# Patient Record
Sex: Female | Born: 1990 | Race: Black or African American | Hispanic: No | Marital: Single | Smoking: Current some day smoker
Health system: Southern US, Community
[De-identification: ages and names within clinical notes are randomized; demographics above are authoritative.]

## PROBLEM LIST (undated history)

## (undated) DIAGNOSIS — T7840XA Allergy, unspecified, initial encounter: Secondary | ICD-10-CM

## (undated) HISTORY — DX: Allergy, unspecified, initial encounter: T78.40XA

## (undated) HISTORY — PX: NO PAST SURGERIES: SHX2092

---

## 2010-01-06 ENCOUNTER — Emergency Department (HOSPITAL_COMMUNITY): Admission: EM | Admit: 2010-01-06 | Discharge: 2010-01-06 | Payer: Self-pay | Admitting: Emergency Medicine

## 2010-01-07 ENCOUNTER — Emergency Department (HOSPITAL_COMMUNITY)
Admission: EM | Admit: 2010-01-07 | Discharge: 2010-01-07 | Payer: Self-pay | Source: Home / Self Care | Admitting: Emergency Medicine

## 2010-01-21 ENCOUNTER — Emergency Department (HOSPITAL_COMMUNITY)
Admission: EM | Admit: 2010-01-21 | Discharge: 2010-01-21 | Payer: Self-pay | Source: Home / Self Care | Admitting: Emergency Medicine

## 2010-02-13 ENCOUNTER — Emergency Department (HOSPITAL_COMMUNITY)
Admission: EM | Admit: 2010-02-13 | Discharge: 2010-02-13 | Payer: Self-pay | Source: Home / Self Care | Admitting: Emergency Medicine

## 2010-08-21 LAB — CBC
Hemoglobin: 13.6 g/dL (ref 12.0–15.0)
MCH: 31.3 pg (ref 26.0–34.0)
MCHC: 34.3 g/dL (ref 30.0–36.0)
MCV: 91.2 fL (ref 78.0–100.0)
RBC: 4.34 MIL/uL (ref 3.87–5.11)
WBC: 10.3 10*3/uL (ref 4.0–10.5)

## 2010-08-21 LAB — POCT I-STAT, CHEM 8
Calcium, Ion: 1.2 mmol/L (ref 1.12–1.32)
Chloride: 104 mEq/L (ref 96–112)
HCT: 43 % (ref 36.0–46.0)
Hemoglobin: 14.6 g/dL (ref 12.0–15.0)

## 2010-08-21 LAB — DIFFERENTIAL
Basophils Relative: 0 % (ref 0–1)
Eosinophils Absolute: 0.2 10*3/uL (ref 0.0–0.7)
Eosinophils Relative: 2 % (ref 0–5)
Monocytes Absolute: 0.7 10*3/uL (ref 0.1–1.0)
Neutrophils Relative %: 79 % — ABNORMAL HIGH (ref 43–77)

## 2010-08-21 LAB — URINALYSIS, ROUTINE W REFLEX MICROSCOPIC
Protein, ur: NEGATIVE mg/dL
pH: 6.5 (ref 5.0–8.0)

## 2010-08-21 LAB — WET PREP, GENITAL: Clue Cells Wet Prep HPF POC: NONE SEEN

## 2010-08-22 LAB — BASIC METABOLIC PANEL
BUN: 7 mg/dL (ref 6–23)
CO2: 26 mEq/L (ref 19–32)
Calcium: 9.6 mg/dL (ref 8.4–10.5)
Chloride: 107 mEq/L (ref 96–112)
Creatinine, Ser: 0.66 mg/dL (ref 0.4–1.2)
GFR calc non Af Amer: >60 mL/min (ref >60)
Potassium: 4.1 mEq/L (ref 3.5–5.1)

## 2010-08-22 LAB — COMPREHENSIVE METABOLIC PANEL
ALT: 23 U/L (ref 0–35)
Albumin: 4.1 g/dL (ref 3.5–5.2)
Alkaline Phosphatase: 64 U/L (ref 39–117)
CO2: 25 mEq/L (ref 19–32)
Calcium: 9.9 mg/dL (ref 8.4–10.5)
Chloride: 105 mEq/L (ref 96–112)
Creatinine, Ser: 0.78 mg/dL (ref 0.4–1.2)
GFR calc non Af Amer: >60 mL/min (ref >60)
Glucose, Bld: 116 mg/dL — ABNORMAL HIGH (ref 70–99)
Potassium: 4.5 mEq/L (ref 3.5–5.1)
Total Bilirubin: 1 mg/dL (ref 0.3–1.2)

## 2010-08-22 LAB — CBC
HCT: 42.6 % (ref 36.0–46.0)
Hemoglobin: 14.6 g/dL (ref 12.0–15.0)
MCH: 31.3 pg (ref 26.0–34.0)
MCH: 31.6 pg (ref 26.0–34.0)
MCV: 91.2 fL (ref 78.0–100.0)
Platelets: 192 10*3/uL (ref 150–400)
Platelets: 191 10*3/uL (ref 150–400)
RBC: 4.67 MIL/uL (ref 3.87–5.11)
RBC: 4.53 MIL/uL (ref 3.87–5.11)
RDW: 13.1 % (ref 11.5–15.5)

## 2010-08-22 LAB — DIFFERENTIAL
Eosinophils Relative: 1 % (ref 0–5)
Lymphocytes Relative: 10 % — ABNORMAL LOW (ref 12–46)
Lymphocytes Relative: 9 % — ABNORMAL LOW (ref 12–46)
Lymphs Abs: 1.1 10*3/uL (ref 0.7–4.0)
Monocytes Absolute: 0.6 10*3/uL (ref 0.1–1.0)
Monocytes Absolute: 0.5 10*3/uL (ref 0.1–1.0)
Monocytes Relative: 4 % (ref 3–12)
Neutro Abs: 10.5 10*3/uL — ABNORMAL HIGH (ref 1.7–7.7)
Neutrophils Relative %: 85 % — ABNORMAL HIGH (ref 43–77)
Neutrophils Relative %: 86 % — ABNORMAL HIGH (ref 43–77)

## 2010-08-22 LAB — URINALYSIS, ROUTINE W REFLEX MICROSCOPIC
Bilirubin Urine: NEGATIVE
Glucose, UA: NEGATIVE mg/dL
Hgb urine dipstick: NEGATIVE
Ketones, ur: NEGATIVE mg/dL
Leukocytes, UA: NEGATIVE
Specific Gravity, Urine: 1.011 (ref 1.005–1.030)
pH: 7.5 (ref 5.0–8.0)

## 2010-08-22 LAB — WET PREP, GENITAL
Trich, Wet Prep: NONE SEEN
Yeast Wet Prep HPF POC: NONE SEEN

## 2010-08-22 LAB — PREGNANCY, URINE: Preg Test, Ur: NEGATIVE

## 2010-08-22 LAB — URINE MICROSCOPIC-ADD ON

## 2010-10-27 ENCOUNTER — Emergency Department (HOSPITAL_COMMUNITY)
Admission: EM | Admit: 2010-10-27 | Discharge: 2010-10-27 | Disposition: A | Discharge status: Home / Self Care | Payer: Self-pay | Attending: Emergency Medicine | Admitting: Emergency Medicine

## 2010-10-27 DIAGNOSIS — R110 Nausea: Secondary | ICD-10-CM | POA: Insufficient documentation

## 2010-10-27 DIAGNOSIS — R197 Diarrhea, unspecified: Secondary | ICD-10-CM | POA: Insufficient documentation

## 2010-10-27 DIAGNOSIS — R109 Unspecified abdominal pain: Secondary | ICD-10-CM | POA: Insufficient documentation

## 2010-10-27 LAB — URINALYSIS, ROUTINE W REFLEX MICROSCOPIC
Bilirubin Urine: NEGATIVE
Hgb urine dipstick: NEGATIVE
Protein, ur: NEGATIVE mg/dL
Specific Gravity, Urine: 1.018 (ref 1.005–1.030)
Urobilinogen, UA: 0.2 mg/dL (ref 0.0–1.0)
pH: 6 (ref 5.0–8.0)

## 2010-10-27 LAB — URINE MICROSCOPIC-ADD ON

## 2011-08-10 ENCOUNTER — Emergency Department (HOSPITAL_COMMUNITY)
Admission: EM | Admit: 2011-08-10 | Discharge: 2011-08-10 | Disposition: A | Discharge status: Home / Self Care | Payer: Self-pay | Attending: Emergency Medicine | Admitting: Emergency Medicine

## 2011-08-10 ENCOUNTER — Encounter (HOSPITAL_COMMUNITY): Payer: Self-pay | Admitting: *Deleted

## 2011-08-10 DIAGNOSIS — R110 Nausea: Secondary | ICD-10-CM | POA: Insufficient documentation

## 2011-08-10 DIAGNOSIS — R109 Unspecified abdominal pain: Secondary | ICD-10-CM | POA: Insufficient documentation

## 2011-08-10 DIAGNOSIS — R197 Diarrhea, unspecified: Secondary | ICD-10-CM | POA: Insufficient documentation

## 2011-08-10 DIAGNOSIS — F17200 Nicotine dependence, unspecified, uncomplicated: Secondary | ICD-10-CM | POA: Insufficient documentation

## 2011-08-10 LAB — URINALYSIS, ROUTINE W REFLEX MICROSCOPIC
Bilirubin Urine: NEGATIVE
Glucose, UA: NEGATIVE mg/dL
Hgb urine dipstick: NEGATIVE
Ketones, ur: NEGATIVE mg/dL
Nitrite: NEGATIVE
Protein, ur: NEGATIVE mg/dL
Specific Gravity, Urine: 1.03 (ref 1.005–1.030)
Urobilinogen, UA: 1 mg/dL (ref 0.0–1.0)
pH: 6 (ref 5.0–8.0)

## 2011-08-10 LAB — CBC
HCT: 37.6 % (ref 36.0–46.0)
Hemoglobin: 12.9 g/dL (ref 12.0–15.0)
RBC: 4.09 MIL/uL (ref 3.87–5.11)

## 2011-08-10 LAB — BASIC METABOLIC PANEL
BUN: 10 mg/dL (ref 6–23)
CO2: 24 mEq/L (ref 19–32)
Chloride: 108 mEq/L (ref 96–112)
Creatinine, Ser: 0.74 mg/dL (ref 0.50–1.10)
Glucose, Bld: 106 mg/dL — ABNORMAL HIGH (ref 70–99)
Potassium: 3.4 mEq/L — ABNORMAL LOW (ref 3.5–5.1)

## 2011-08-10 LAB — DIFFERENTIAL
Basophils Absolute: 0 10*3/uL (ref 0.0–0.1)
Lymphs Abs: 1.7 10*3/uL (ref 0.7–4.0)
Monocytes Absolute: 0.4 10*3/uL (ref 0.1–1.0)
Monocytes Relative: 6 % (ref 3–12)
Neutro Abs: 5 10*3/uL (ref 1.7–7.7)
Neutrophils Relative %: 68 % (ref 43–77)

## 2011-08-10 LAB — URINE MICROSCOPIC-ADD ON

## 2011-08-10 MED ORDER — ONDANSETRON HCL 4 MG/2ML IJ SOLN
4.0000 mg | Freq: Once | INTRAMUSCULAR | Status: AC
  Administered 2011-08-10: 4 mg via INTRAVENOUS
  Filled 2011-08-10: qty 2

## 2011-08-10 MED ORDER — LOPERAMIDE HCL 2 MG PO CAPS
4.0000 mg | ORAL_CAPSULE | Freq: Once | ORAL | Status: AC
  Administered 2011-08-10: 4 mg via ORAL
  Filled 2011-08-10: qty 1

## 2011-08-10 MED ORDER — METOCLOPRAMIDE HCL 10 MG PO TABS: 10.0000 mg | ORAL_TABLET | Freq: Four times a day (QID) | ORAL | Status: DC | PRN

## 2011-08-10 MED ORDER — SODIUM CHLORIDE 0.9 % IV SOLN
Freq: Once | INTRAVENOUS | Status: AC
  Administered 2011-08-10: 10:00:00 via INTRAVENOUS

## 2011-08-10 MED ORDER — SODIUM CHLORIDE 0.9 % IV BOLUS (SEPSIS)
1000.0000 mL | Freq: Once | INTRAVENOUS | Status: AC
  Administered 2011-08-10 (×2): 1000 mL via INTRAVENOUS

## 2011-08-10 NOTE — ED Provider Notes (Signed)
History     CSN: [id number]  Arrival date & time 08/10/11  0865   First MD Initiated Contact with Patient 08/10/11 [phone]      Chief Complaint  Patient presents with  . Abdominal Pain  . Nausea  . Diarrhea    (Consider location/radiation/quality/duration/timing/severity/associated sxs/prior treatment) Patient is a 21 y.o. female presenting with abdominal pain and diarrhea. The history is provided by the patient.  Abdominal Pain The primary symptoms of the illness include abdominal pain and diarrhea.  Diarrhea The primary symptoms include abdominal pain and diarrhea.  She noted onset this morning at 7 AM of crampy suprapubic pain which radiated up the left abdomen into the left upper quadrant. There is associated nausea and diarrhea. Pain got worse just before having diarrhea, and was better after a bowel movement. There is no radiation to the back. She's not had any vomiting. Pain is severe. Current pain is rated at 8/10, and it was 10 out of 10 at its worst. Nothing else seems to affect the pain. She's not taken any medication for it. She denies any sick contacts. Denies fever, chills, sweats.  History reviewed. No pertinent past medical history.  History reviewed. No pertinent past surgical history.  No family history on file.  History  Substance Use Topics  . Smoking status: Current Everyday Smoker  . Smokeless tobacco: Not on file  . Alcohol Use: Yes    OB History    Grav Para Term Preterm Abortions TAB SAB Ect Mult Living                  Review of Systems  Gastrointestinal: Positive for abdominal pain and diarrhea.  All other systems reviewed and are negative.    Allergies  Review of patient's allergies indicates no known allergies.  Home Medications  No current outpatient prescriptions on file.  BP 129/64  Pulse 56  Temp(Src) 98 F (36.7 C) (Oral)  Resp 16  Ht 5\' 2"  (1.575 m)  Wt 210 lb (95.255 kg)  BMI 38.41 kg/m2  SpO2 98%  Physical Exam    Nursing note and vitals reviewed.  21 year old female who is resting comfortably and in no acute distress. Vital signs are significant for mild bradycardia with heart rate of 56. Oxygen saturation is 98% which is normal. She is moderately obese. Head is normocephalic and atraumatic. PERRLA, EOMI. Oropharynx is clear mucous membranes are moist. Neck is nontender and supple. There is no adenopathy. Back is nontender. Lungs are clear without rales, wheezes, rhonchi. Heart has regular rate rhythm without murmur. Is no chest wall tenderness. Abdomen is soft, flat, nontender without masses or hepatosplenomegaly. Peristalsis is diminished. Extremities have no cyanosis or edema, full range of motion is present. Skin is warm and dry without rash. Neurologic: Mental status is normal, cranial nerves are intact, there no focal motor or sensory deficits.  ED Course  Procedures (including critical care time)  Results for orders placed during the hospital encounter of 08/10/11  URINALYSIS, ROUTINE W REFLEX MICROSCOPIC      Component Value Range   Color, Urine YELLOW  YELLOW    APPearance CLOUDY (*) CLEAR    Specific Gravity, Urine 1.030  1.005 - 1.030    pH 6.0  5.0 - 8.0    Glucose, UA NEGATIVE  NEGATIVE (mg/dL)   Hgb urine dipstick NEGATIVE  NEGATIVE    Bilirubin Urine NEGATIVE  NEGATIVE    Ketones, ur NEGATIVE  NEGATIVE (mg/dL)   Protein, ur NEGATIVE  NEGATIVE (  mg/dL)   Urobilinogen, UA 1.0  0.0 - 1.0 (mg/dL)   Nitrite NEGATIVE  NEGATIVE    Leukocytes, UA MODERATE (*) NEGATIVE   CBC      Component Value Range   WBC 7.3  4.0 - 10.5 (K/uL)   RBC 4.09  3.87 - 5.11 (MIL/uL)   Hemoglobin 12.9  12.0 - 15.0 (g/dL)   HCT 40.9  81.1 - 91.4 (%)   MCV 91.9  78.0 - 100.0 (fL)   MCH 31.5  26.0 - 34.0 (pg)   MCHC 34.3  30.0 - 36.0 (g/dL)   RDW 78.2  95.6 - 21.3 (%)   Platelets 194  150 - 400 (K/uL)  DIFFERENTIAL      Component Value Range   Neutrophils Relative 68  43 - 77 (%)   Neutro Abs 5.0  1.7 - 7.7  (K/uL)   Lymphocytes Relative 23  12 - 46 (%)   Lymphs Abs 1.7  0.7 - 4.0 (K/uL)   Monocytes Relative 6  3 - 12 (%)   Monocytes Absolute 0.4  0.1 - 1.0 (K/uL)   Eosinophils Relative 3  0 - 5 (%)   Eosinophils Absolute 0.2  0.0 - 0.7 (K/uL)   Basophils Relative 0  0 - 1 (%)   Basophils Absolute 0.0  0.0 - 0.1 (K/uL)  BASIC METABOLIC PANEL      Component Value Range   Sodium 142  135 - 145 (mEq/L)   Potassium 3.4 (*) 3.5 - 5.1 (mEq/L)   Chloride 108  96 - 112 (mEq/L)   CO2 24  19 - 32 (mEq/L)   Glucose, Bld 106 (*) 70 - 99 (mg/dL)   BUN 10  6 - 23 (mg/dL)   Creatinine, Ser 0.86  0.50 - 1.10 (mg/dL)   Calcium 9.1  8.4 - 57.8 (mg/dL)   GFR calc non Af Amer >90  >90 (mL/min)   GFR calc Af Amer >90  >90 (mL/min)  POCT PREGNANCY, URINE      Component Value Range   Preg Test, Ur NEGATIVE  NEGATIVE   URINE MICROSCOPIC-ADD ON      Component Value Range   Squamous Epithelial / LPF MANY (*) RARE    WBC, UA 3-6  <3 (WBC/hpf)   Bacteria, UA FEW (*) RARE    Urine-Other MUCOUS PRESENT     She feels much better after IV hydration, IV Zofran, and oral loperamide. She laboratory workup is unremarkable. She will be discharged with a prescription for metoclopramide tablets and told to use loperamide as needed for diarrhea. She is to return if symptoms worsen.  1. Diarrhea   2. Nausea       MDM  Abdominal cramping with diarrhea nausea most consistent with a viral illness. She'll be given IV fluids, IV antiemetics, oral antidiarrheal medications and laboratory workup has been initiated. She will be reassessed after the above treatment.        Dione Booze, MD 08/10/11 1025

## 2011-08-10 NOTE — ED Notes (Signed)
Pt states she began having low abdominal pain this am.  Pt reports nausea with a small episode of diarrhea.

## 2011-08-10 NOTE — ED Notes (Signed)
Patient reports onset of lower abd pain and n/d at 0700.  Patient reports 3 to 4 episodes of loose bm

## 2011-08-10 NOTE — Discharge Instructions (Signed)
Take Imodium AD as needed for diarrhea. Return to the ED if your pain is getting worse, you start running a fever, or if you are vomiting in spite of the nausea medicine.  Diarrhea Infections caused by germs (bacterial) or a virus commonly cause diarrhea. Your caregiver has determined that with time, rest and fluids, the diarrhea should improve. In general, eat normally while drinking more water than usual. Although water may prevent dehydration, it does not contain salt and minerals (electrolytes). Broths, weak tea without caffeine and oral rehydration solutions (ORS) replace fluids and electrolytes. Small amounts of fluids should be taken frequently. Large amounts at one time may not be tolerated. Plain water may be harmful in infants and the elderly. Oral rehydrating solutions (ORS) are available at pharmacies and grocery stores. ORS replace water and important electrolytes in proper proportions. Sports drinks are not as effective as ORS and may be harmful due to sugars worsening diarrhea.  ORS is especially recommended for use in children with diarrhea. As a general guideline for children, replace any new fluid losses from diarrhea and/or vomiting with ORS as follows:   If your child weighs 22 pounds or under (10 kg or less), give 60-120 mL ( -  cup or 2 - 4 ounces) of ORS for each episode of diarrheal stool or vomiting episode.   If your child weighs more than 22 pounds (more than 10 kgs), give 120-240 mL ( - 1 cup or 4 - 8 ounces) of ORS for each diarrheal stool or episode of vomiting.   While correcting for dehydration, children should eat normally. However, foods high in sugar should be avoided because this may worsen diarrhea. Large amounts of carbonated soft drinks, juice, gelatin desserts and other highly sugared drinks should be avoided.   After correction of dehydration, other liquids that are appealing to the child may be added. Children should drink small amounts of fluids frequently  and fluids should be increased as tolerated. Children should drink enough fluids to keep urine clear or pale yellow.   Adults should eat normally while drinking more fluids than usual. Drink small amounts of fluids frequently and increase as tolerated. Drink enough fluids to keep urine clear or pale yellow. Broths, weak decaffeinated tea, lemon lime soft drinks (allowed to go flat) and ORS replace fluids and electrolytes.   Avoid:   Carbonated drinks.   Juice.   Extremely hot or cold fluids.   Caffeine drinks.   Fatty, greasy foods.   Alcohol.   Tobacco.   Too much intake of anything at one time.   Gelatin desserts.   Probiotics are active cultures of beneficial bacteria. They may lessen the amount and number of diarrheal stools in adults. Probiotics can be found in yogurt with active cultures and in supplements.   Wash hands well to avoid spreading bacteria and virus.   Anti-diarrheal medications are not recommended for infants and children.   Only take over-the-counter or prescription medicines for pain, discomfort or fever as directed by your caregiver. Do not give aspirin to children because it may cause Reye's Syndrome.   For adults, ask your caregiver if you should continue all prescribed and over-the-counter medicines.   If your caregiver has given you a follow-up appointment, it is very important to keep that appointment. Not keeping the appointment could result in a chronic or permanent injury, and disability. If there is any problem keeping the appointment, you must call back to this facility for assistance.  SEEK IMMEDIATE MEDICAL  CARE IF:   You or your child is unable to keep fluids down or other symptoms or problems become worse in spite of treatment.   Vomiting or diarrhea develops and becomes persistent.   There is vomiting of blood or bile (green material).   There is blood in the stool or the stools are black and tarry.   There is no urine output in 6-8  hours or there is only a small amount of very dark urine.   Abdominal pain develops, increases or localizes.   You have a fever.   Your baby is older than 3 months with a rectal temperature of 102 F (38.9 C) or higher.   Your baby is 31 months old or younger with a rectal temperature of 100.4 F (38 C) or higher.   You or your child develops excessive weakness, dizziness, fainting or extreme thirst.   You or your child develops a rash, stiff neck, severe headache or become irritable or sleepy and difficult to awaken.  MAKE SURE YOU:   Understand these instructions.   Will watch your condition.   Will get help right away if you are not doing well or get worse.  Document Released: 05/15/2002 Document Revised: 05/14/2011 Document Reviewed: 04/01/2009 [facility] Patient Information 2012 [place], [place].  Nausea and Vomiting Nausea is a sick feeling that often comes before throwing up (vomiting). Vomiting is a reflex where stomach contents come out of your mouth. Vomiting can cause severe loss of body fluids (dehydration). Children and elderly adults can become dehydrated quickly, especially if they also have diarrhea. Nausea and vomiting are symptoms of a condition or disease. It is important to find the cause of your symptoms. CAUSES   Direct irritation of the stomach lining. This irritation can result from increased acid production (gastroesophageal reflux disease), infection, food poisoning, taking certain medicines (such as nonsteroidal anti-inflammatory drugs), alcohol use, or tobacco use.   Signals from the brain.These signals could be caused by a headache, heat exposure, an inner ear disturbance, increased pressure in the brain from injury, infection, a tumor, or a concussion, pain, emotional stimulus, or metabolic problems.   An obstruction in the gastrointestinal tract (bowel obstruction).   Illnesses such as diabetes, hepatitis, gallbladder problems, appendicitis, kidney  problems, cancer, sepsis, atypical symptoms of a heart attack, or eating disorders.   Medical treatments such as chemotherapy and radiation.   Receiving medicine that makes you sleep (general anesthetic) during surgery.  DIAGNOSIS Your caregiver may ask for tests to be done if the problems do not improve after a few days. Tests may also be done if symptoms are severe or if the reason for the nausea and vomiting is not clear. Tests may include:  Urine tests.   Blood tests.   Stool tests.   Cultures (to look for evidence of infection).   X-rays or other imaging studies.  Test results can help your caregiver make decisions about treatment or the need for additional tests. TREATMENT You need to stay well hydrated. Drink frequently but in small amounts.You may wish to drink water, sports drinks, clear broth, or eat frozen ice pops or gelatin dessert to help stay hydrated.When you eat, eating slowly may help prevent nausea.There are also some antinausea medicines that may help prevent nausea. HOME CARE INSTRUCTIONS   Take all medicine as directed by your caregiver.   If you do not have an appetite, do not force yourself to eat. However, you must continue to drink fluids.   If you  have an appetite, eat a normal diet unless your caregiver tells you differently.   Eat a variety of complex carbohydrates (rice, wheat, potatoes, bread), lean meats, yogurt, fruits, and vegetables.   Avoid high-fat foods because they are more difficult to digest.   Drink enough water and fluids to keep your urine clear or pale yellow.   If you are dehydrated, ask your caregiver for specific rehydration instructions. Signs of dehydration may include:   Severe thirst.   Dry lips and mouth.   Dizziness.   Dark urine.   Decreasing urine frequency and amount.   Confusion.   Rapid breathing or pulse.  SEEK IMMEDIATE MEDICAL CARE IF:   You have blood or brown flecks (like coffee grounds) in your  vomit.   You have black or bloody stools.   You have a severe headache or stiff neck.   You are confused.   You have severe abdominal pain.   You have chest pain or trouble breathing.   You do not urinate at least once every 8 hours.   You develop cold or clammy skin.   You continue to vomit for longer than 24 to 48 hours.   You have a fever.  MAKE SURE YOU:   Understand these instructions.   Will watch your condition.   Will get help right away if you are not doing well or get worse.  Document Released: 05/25/2005 Document Revised: 05/14/2011 Document Reviewed: 10/22/2010 [facility] Patient Information 2012 [place], [place].  Metoclopramide tablets What is this medicine? METOCLOPRAMIDE (met oh kloe PRA mide) is used to treat the symptoms of gastroesophageal reflux disease (GERD) like heartburn. It is also used to treat people with slow emptying of the stomach and intestinal tract. This medicine may be used for other purposes; ask your health care provider or pharmacist if you have questions. What should I tell my health care provider before I take this medicine? They need to know if you have any of these conditions: -breast cancer -depression -diabetes -heart failure -high blood pressure -kidney disease -liver disease -Parkinson's disease or a movement disorder -pheochromocytoma -seizures -stomach obstruction, bleeding, or perforation -an unusual or allergic reaction to metoclopramide, procainamide, sulfites, other medicines, foods, dyes, or preservatives -pregnant or trying to get pregnant -breast-feeding How should I use this medicine? Take this medicine by mouth with a glass of water. Follow the directions on the prescription label. Take this medicine on an empty stomach, about 30 minutes before eating. Take your doses at regular intervals. Do not take your medicine more often than directed. Do not stop taking except on the advice of your doctor or health care  professional. A special MedGuide will be given to you by the pharmacist with each prescription and refill. Be sure to read this information carefully each time. Talk to your pediatrician regarding the use of this medicine in children. Special care may be needed. Overdosage: If you think you have taken too much of this medicine contact a poison control center or emergency room at once. NOTE: This medicine is only for you. Do not share this medicine with others. What if I miss a dose? If you miss a dose, take it as soon as you can. If it is almost time for your next dose, take only that dose. Do not take double or extra doses. What may interact with this medicine? -acetaminophen -cyclosporine -digoxin -medicines for blood pressure -medicines for diabetes, including insulin -medicines for hay fever and other allergies -medicines for depression, especially an  Monoamine Oxidase Inhibitor (MAOI) -medicines for Parkinson's disease, like levodopa -medicines for sleep or for pain -tetracycline This list may not describe all possible interactions. Give your health care provider a list of all the medicines, herbs, non-prescription drugs, or dietary supplements you use. Also tell them if you smoke, drink alcohol, or use illegal drugs. Some items may interact with your medicine. What should I watch for while using this medicine? It may take a few weeks for your stomach condition to start to get better. However, do not take this medicine for longer than 12 weeks. The longer you take this medicine, and the more you take it, the greater your chances are of developing serious side effects. If you are an elderly patient, a female patient, or you have diabetes, you may be at an increased risk for side effects from this medicine. Contact your doctor immediately if you start having movements you cannot control such as lip smacking, rapid movements of the tongue, involuntary or uncontrollable movements of the eyes,  head, arms and legs, or muscle twitches and spasms. Patients and their families should watch out for worsening depression or thoughts of suicide. Also watch out for any sudden or severe changes in feelings such as feeling anxious, agitated, panicky, irritable, hostile, aggressive, impulsive, severely restless, overly excited and hyperactive, or not being able to sleep. If this happens, especially at the beginning of treatment or after a change in dose, call your doctor. Do not treat yourself for high fever. Ask your doctor or health care professional for advice. You may get drowsy or dizzy. Do not drive, use machinery, or do anything that needs mental alertness until you know how this drug affects you. Do not stand or sit up quickly, especially if you are an older patient. This reduces the risk of dizzy or fainting spells. Alcohol can make you more drowsy and dizzy. Avoid alcoholic drinks. What side effects may I notice from receiving this medicine? Side effects that you should report to your doctor or health care professional as soon as possible: -allergic reactions like skin rash, itching or hives, swelling of the face, lips, or tongue -abnormal production of milk in females -breast enlargement in both males and females -change in the way you walk -difficulty moving, speaking or swallowing -drooling, lip smacking, or rapid movements of the tongue -excessive sweating -fever -involuntary or uncontrollable movements of the eyes, head, arms and legs -irregular heartbeat or palpitations -muscle twitches and spasms -unusually weak or tired Side effects that usually do not require medical attention (report to your doctor or health care professional if they continue or are bothersome): -change in sex drive or performance -depressed mood -diarrhea -difficulty sleeping -headache -menstrual changes -restless or nervous This list may not describe all possible side effects. Call your doctor for  medical advice about side effects. You may report side effects to FDA at 1-800-FDA-1088. Where should I keep my medicine? Keep out of the reach of children. Store at room temperature between 20 and 25 degrees C (68 and 77 degrees F). Protect from light. Keep container tightly closed. Throw away any unused medicine after the expiration date. NOTE: This sheet is a summary. It may not cover all possible information. If you have questions about this medicine, talk to your doctor, pharmacist, or health care provider.  2012, [employer]. (01/18/2008 4:30:05 PM)

## 2012-06-25 IMAGING — CT CT ABD-PELV W/ CM
2 of 4 series · 17 of 46 positions shown, 19 images · IV contrast (100 ML OMNI 300)
Comparison: None.

CLINICAL DATA: Abdominal pain.

CT ABDOMEN AND PELVIS WITH CONTRAST
TECHNIQUE: Multidetector CT imaging of the abdomen and pelvis was
performed following the standard protocol during bolus
administration of intravenous contrast.
Contrast: 100 ml Rmnipaque-DYY IV

[Series 2: routine abdomen · axial · 0.70mm/px · z∈[-412,-52]mm · 14 of 78 slices shown, 16 images]
[im 4/78  soft-tissue]
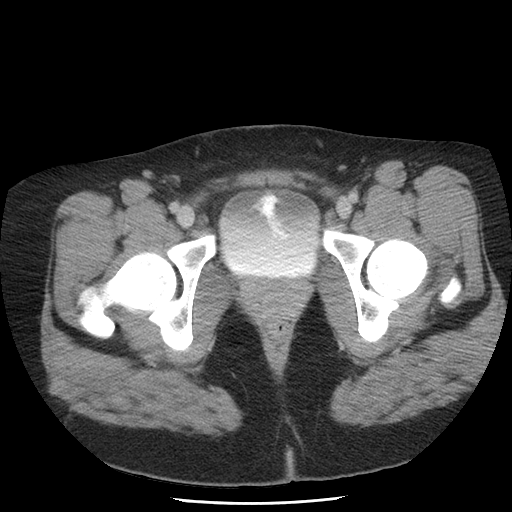
[im 4/78  bone]
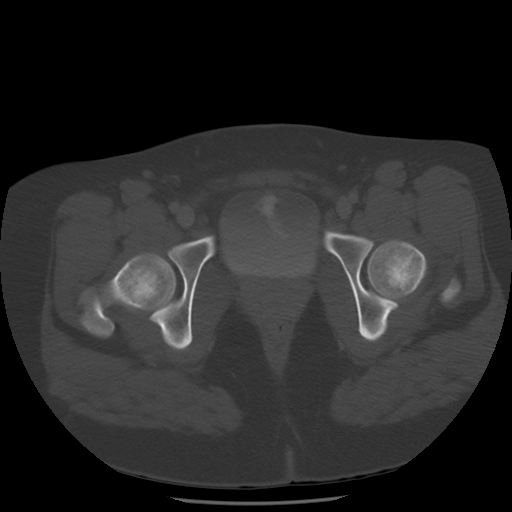
[im 11/78  soft-tissue]
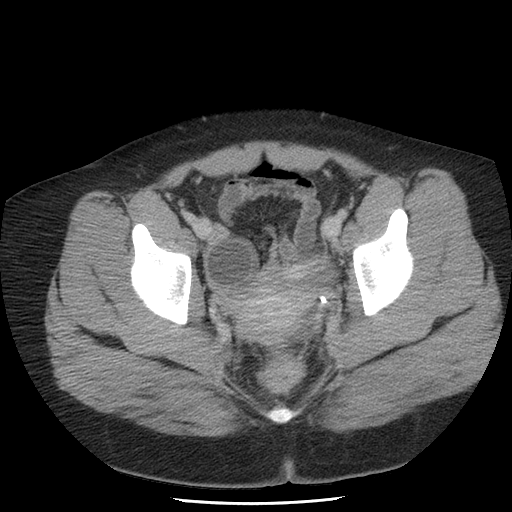
[im 14/78  soft-tissue]
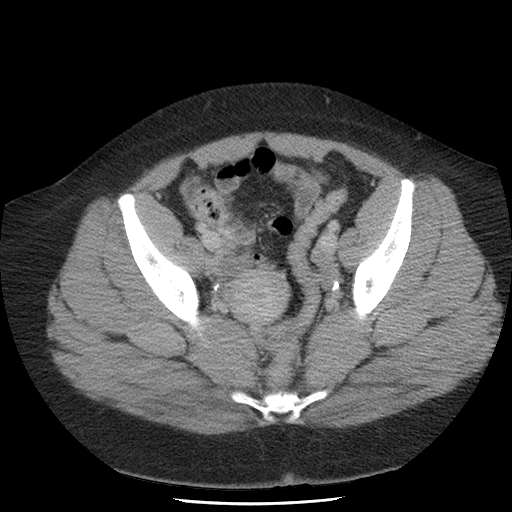
[im 21/78  soft-tissue]
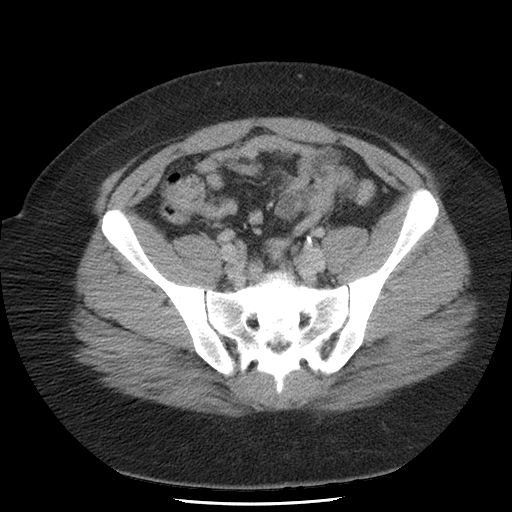
[im 27/78  soft-tissue]
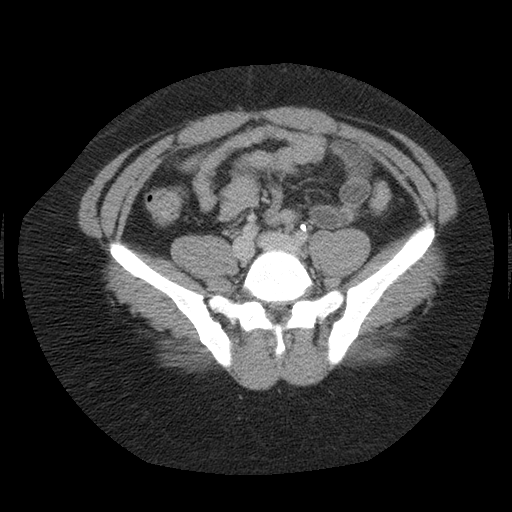
[im 31/78  soft-tissue]
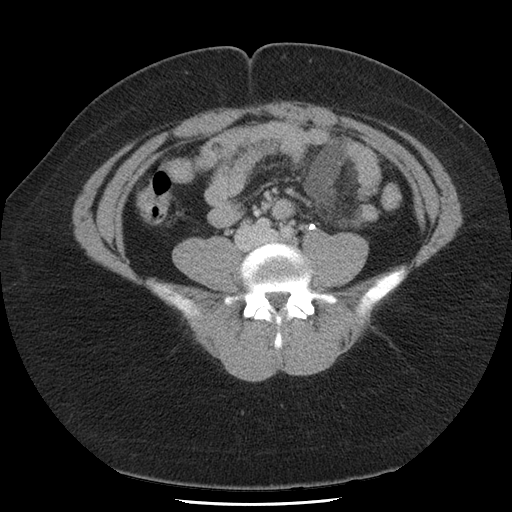
[im 37/78  soft-tissue]
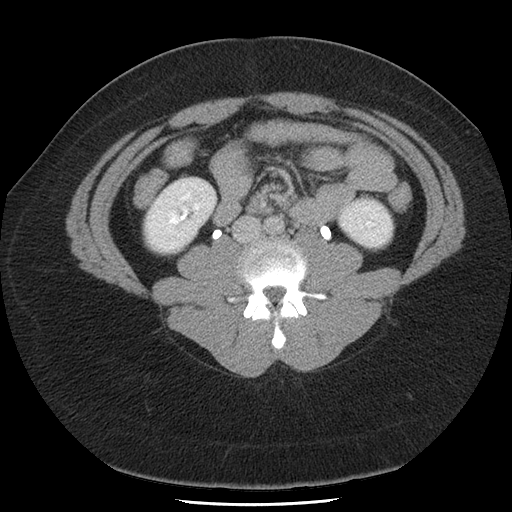
[im 41/78  soft-tissue]
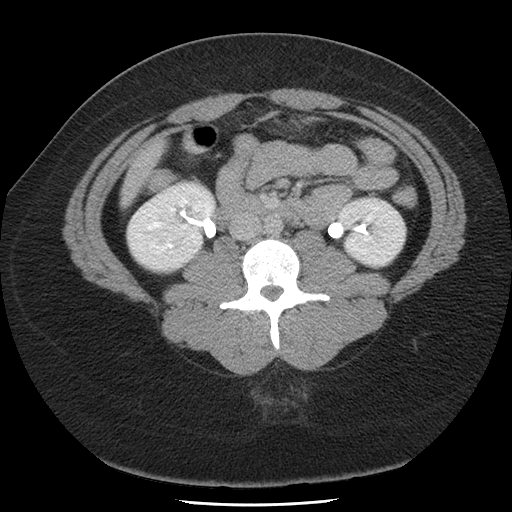
[im 47/78  soft-tissue]
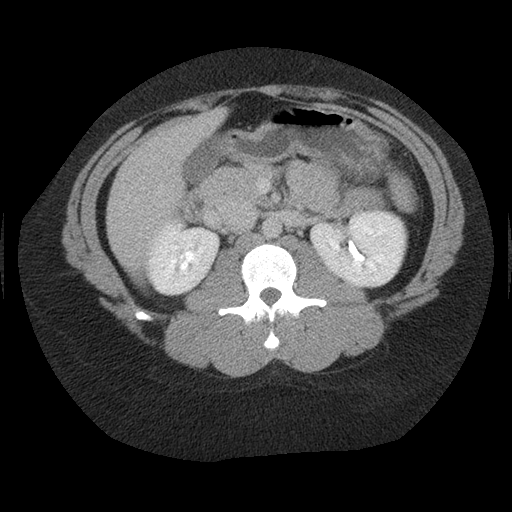
[im 47/78  bone]
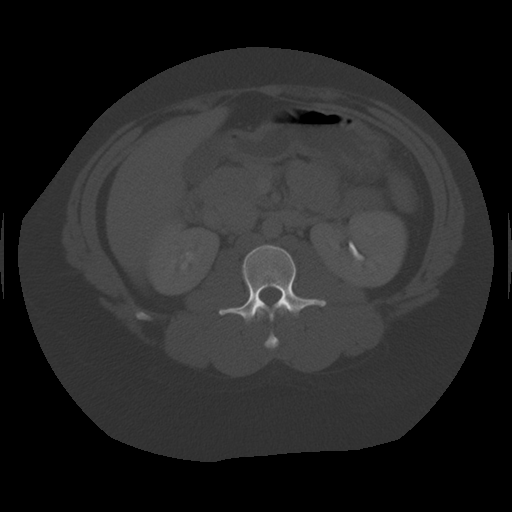
[im 51/78  soft-tissue]
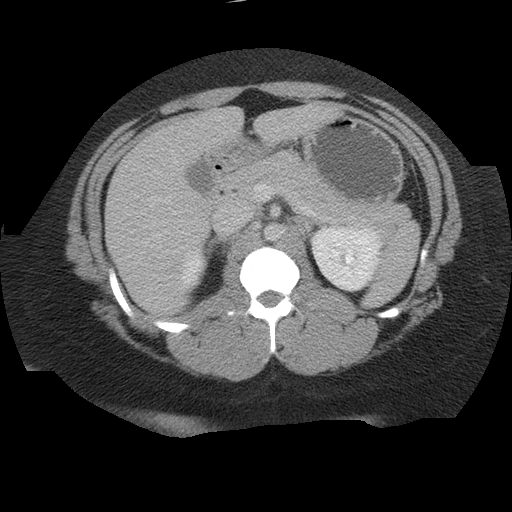
[im 57/78  soft-tissue]
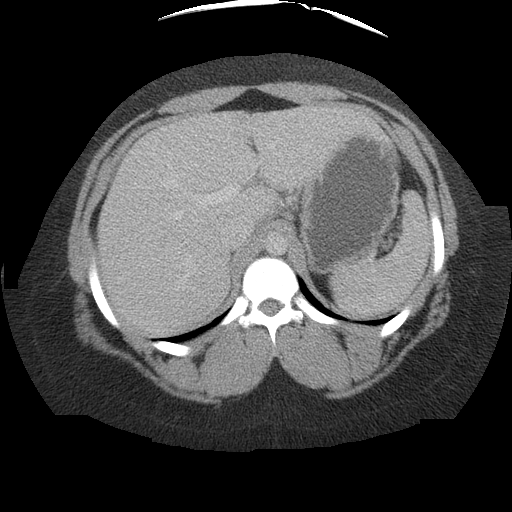
[im 64/78  soft-tissue]
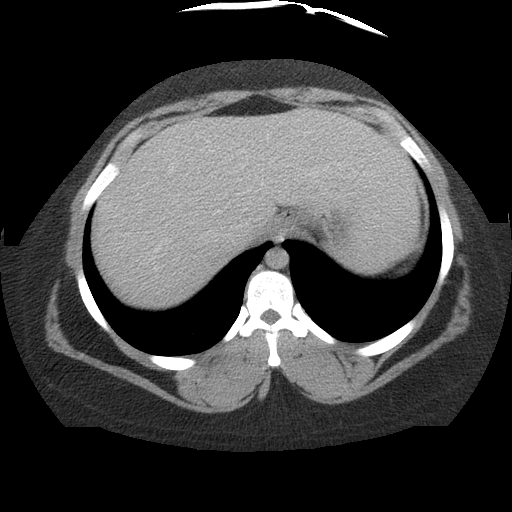
[im 67/78  soft-tissue]
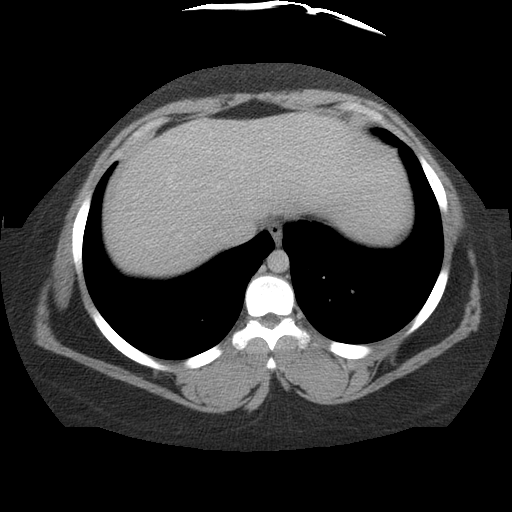
[im 74/78  soft-tissue]
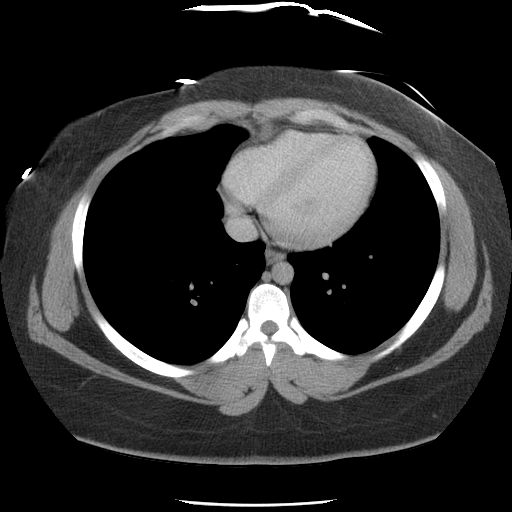

[Series 401: cor · coronal · 0.88mm/px · 3 of 103 slices shown]
[im 35/103  soft-tissue]
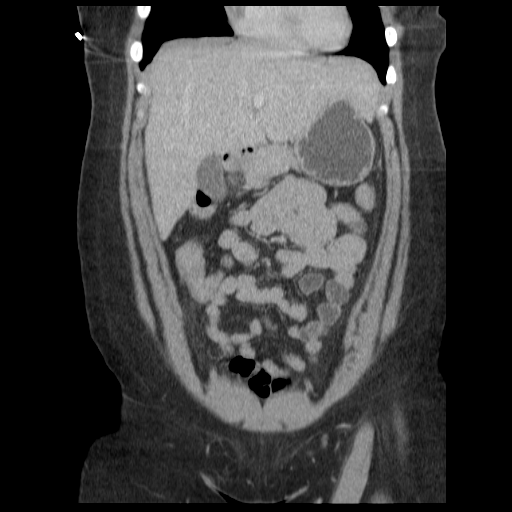
[im 46/103  soft-tissue]
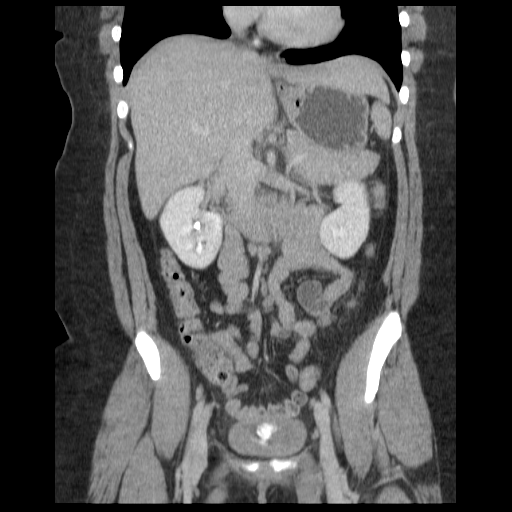
[im 57/103  soft-tissue]
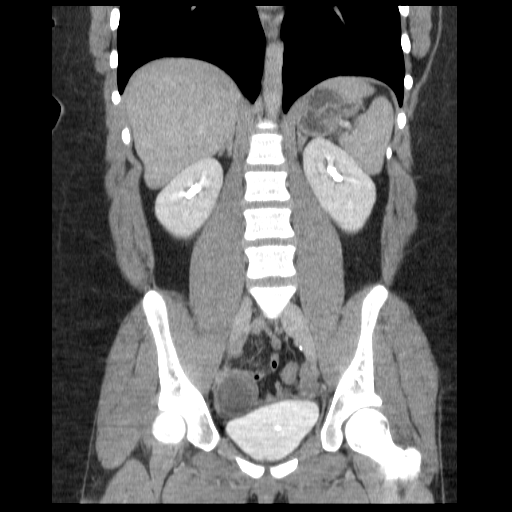

[17 of 46 positions shown; findings below may reference images not displayed]

FINDINGS: The liver, spleen, pancreas, gallbladder, adrenal glands
and kidneys are within normal limits.  No acute inflammatory
process is identified.  There is no CT evidence of appendicitis.
There is an enlarged right adnexal cyst measuring approximately
cm in greatest diameter and showing internal fluid density.  No
associated free fluid identified.  The bladder is unremarkable.

No hernias or abnormal calcifications.  No evidence of bowel
obstruction.
IMPRESSION: No acute findings.  4 cm right adnexal cyst.

## 2013-03-16 ENCOUNTER — Ambulatory Visit (INDEPENDENT_AMBULATORY_CARE_PROVIDER_SITE_OTHER): Payer: Self-pay | Admitting: General Practice

## 2013-03-16 ENCOUNTER — Encounter: Payer: Self-pay | Admitting: Obstetrics & Gynecology

## 2013-03-16 DIAGNOSIS — Z3201 Encounter for pregnancy test, result positive: Secondary | ICD-10-CM

## 2013-03-16 DIAGNOSIS — Z3490 Encounter for supervision of normal pregnancy, unspecified, unspecified trimester: Secondary | ICD-10-CM

## 2013-03-16 LAB — POCT PREGNANCY, URINE: Preg Test, Ur: POSITIVE — AB

## 2013-03-23 ENCOUNTER — Encounter: Payer: Self-pay | Admitting: Obstetrics & Gynecology

## 2013-03-23 ENCOUNTER — Ambulatory Visit (HOSPITAL_COMMUNITY)
Admission: RE | Admit: 2013-03-23 | Discharge: 2013-03-23 | Disposition: A | Discharge status: Home / Self Care | Payer: BC Managed Care – PPO | Source: Ambulatory Visit | Attending: Obstetrics & Gynecology | Admitting: Obstetrics & Gynecology

## 2013-03-23 DIAGNOSIS — R109 Unspecified abdominal pain: Secondary | ICD-10-CM | POA: Insufficient documentation

## 2013-03-23 DIAGNOSIS — Z3689 Encounter for other specified antenatal screening: Secondary | ICD-10-CM | POA: Insufficient documentation

## 2013-03-23 DIAGNOSIS — Z3201 Encounter for pregnancy test, result positive: Secondary | ICD-10-CM

## 2013-03-23 DIAGNOSIS — O99891 Other specified diseases and conditions complicating pregnancy: Secondary | ICD-10-CM | POA: Insufficient documentation

## 2013-03-23 DIAGNOSIS — O4400 Placenta previa specified as without hemorrhage, unspecified trimester: Secondary | ICD-10-CM | POA: Insufficient documentation

## 2013-04-12 ENCOUNTER — Encounter: Payer: Self-pay | Admitting: Advanced Practice Midwife

## 2013-04-12 ENCOUNTER — Ambulatory Visit (INDEPENDENT_AMBULATORY_CARE_PROVIDER_SITE_OTHER): Payer: Self-pay | Admitting: Advanced Practice Midwife

## 2013-04-12 VITALS — BP 135/77 | Temp 98.6°F | Wt 164.9 lb

## 2013-04-12 DIAGNOSIS — Z23 Encounter for immunization: Secondary | ICD-10-CM

## 2013-04-12 DIAGNOSIS — Z3402 Encounter for supervision of normal first pregnancy, second trimester: Secondary | ICD-10-CM

## 2013-04-12 DIAGNOSIS — O4400 Placenta previa specified as without hemorrhage, unspecified trimester: Secondary | ICD-10-CM

## 2013-04-12 LAB — POCT URINALYSIS DIP (DEVICE)
Glucose, UA: NEGATIVE mg/dL
Ketones, ur: NEGATIVE mg/dL
Specific Gravity, Urine: 1.025 (ref 1.005–1.030)

## 2013-04-12 NOTE — Patient Instructions (Signed)
Prenatal Care  WHAT IS PRENATAL CARE?  Prenatal care means health care during your pregnancy, before your baby is born. Take care of yourself and your baby by:   Getting early prenatal care. If you know you are pregnant, or think you might be pregnant, call your caregiver as soon as possible. Schedule a visit for a general/prenatal examination.  Getting regular prenatal care. Follow your caregiver's schedule for blood and other necessary tests. Do not miss appointments.  Do everything you can to keep yourself and your baby healthy during your pregnancy.  Prenatal care should include evaluation of medical, dietary, educational, psychological, and social needs for the couple and the medical, surgical, and genetic history of the family of the mother and father.  Discuss with your caregiver:  Your medicines, prescription, over-the-counter, and herbal medicines.  Substance abuse, alcohol, smoking, and illegal drugs.  Domestic abuse and violence, if present.  Your immunizations.  Nutrition and diet.  Exercising.  Environment and occupational hazards, at home and at work.  History of sexually transmitted disease, both you and your partner.  Previous pregnancies. WHY IS PRENATAL CARE SO IMPORTANT?  By seeing you regularly, your caregiver has the chance to find problems early, so that they can be treated as soon as possible. Other problems might be prevented. Many studies have shown that early and regular prenatal care is important for the health of both mothers and their babies.  I AM THINKING ABOUT GETTING PREGNANT. HOW CAN I TAKE CARE OF MYSELF?  Taking care of yourself before you get pregnant helps you to have a healthy pregnancy. It also lowers your chances of having a baby born with a birth defect. Here are ways to take care of yourself before you get pregnant:   Eat healthy foods, exercise regularly (30 minutes per day for most days of the week is best), and get enough rest and  sleep. Talk to your caregiver about what kinds of foods and exercises are best for you.  Take 400 micrograms (mcg) of folic acid (one of the B vitamins) every day. The best way to do this is to take a daily multivitamin pill that contains this amount of folic acid. Getting enough of the synthetic (manufactured) form of folic acid every day before you get pregnant and during early pregnancy can help prevent certain birth defects. Many breakfast cereals and other grain products have folic acid added to them, but only certain cereals contain 400 mcg of folic acid per serving. Check the label on your multivitamin or cereal to find the amount of folic acid in the food.  See your caregiver for a complete check up before getting pregnant. Make sure that you have had all your immunization shots, especially for rubella ([place] measles). Rubella can cause serious birth defects. Chickenpox is another illness you want to avoid during pregnancy. If you have had chickenpox and rubella in the past, you should be immune to them.  Tell your caregiver about any prescription or non-prescription medicines (including herbal remedies) you are taking. Some medicines are not safe to take during pregnancy.  Stop smoking cigarettes, drinking alcohol, or taking illegal drugs. Ask your caregiver for help, if you need it. You can also get help with alcohol and drugs by talking with a member of your faith community, a counselor, or a trusted friend.  Discuss and treat any medical, social, or psychological problems before getting pregnant.  Discuss any history of genetic problems in the mother, father, and their families. Do  genetic testing before getting pregnant, when possible.  Discuss any physical or emotional abuse with your caregiver.  Discuss with your caregiver if you might be exposed to harmful chemicals on your job or where you live.  Discuss with your caregiver if you think your job or the hours you work may be  harmful and should be changed.  The father should be involved with the decision making and with all aspects of the pregnancy, labor, and delivery.  If you have medical insurance, make sure you are covered for pregnancy. I JUST FOUND OUT THAT I AM PREGNANT. HOW CAN I TAKE CARE OF MYSELF?  Here are ways to take care of yourself and the precious new life growing inside you:   Continue taking your multivitamin with 400 micrograms (mcg) of folic acid every day.  Get early and regular prenatal care. It does not matter if this is your first pregnancy or if you already have children. It is very important to see a caregiver during your pregnancy. Your caregiver will check at each visit to make sure that you and the baby are healthy. If there are any problems, action can be taken right away to help you and the baby.  Eat a healthy diet that includes:  Fruits.  Vegetables.  Foods low in saturated fat.  Grains.  Calcium-rich foods.  Drink 6 to 8 glasses of liquids a day.  Unless your caregiver tells you not to, try to be physically active for 30 minutes, most days of the week. If you are pressed for time, you can get your activity in through 10 minute segments, three times a day.  If you smoke, drink alcohol, or use drugs, STOP. These can cause long-term damage to your baby. Talk with your caregiver about steps to take to stop smoking. Talk with a member of your faith community, a counselor, a trusted friend, or your caregiver if you are concerned about your alcohol or drug use.  Ask your caregiver before taking any medicine, even over-the-counter medicines. Some medicines are not safe to take during pregnancy.  Get plenty of rest and sleep.  Avoid hot tubs and saunas during pregnancy.  Do not have X-rays taken, unless absolutely necessary and with the recommendation of your caregiver. A lead shield can be placed on your abdomen, to protect the baby when X-rays are taken in other parts of the  body.  Do not empty the cat litter when you are pregnant. It may contain a parasite that causes an infection called toxoplasmosis, which can cause birth defects. Also, use gloves when working in garden areas used by cats.  Do not eat uncooked or undercooked cheese, meats, or fish.  Stay away from toxic chemicals like:  Insecticides.  Solvents (some cleaners or paint thinners).  Lead.  Mercury.  Sexual relations may continue until the end of the pregnancy, unless you have a medical problem or there is a problem with the pregnancy and your caregiver tells you not to.  Do not wear high heel shoes, especially during the second half of the pregnancy. You can lose your balance and fall.  Do not take long trips, unless absolutely necessary. Be sure to see your caregiver before going on the trip.  Do not sit in one position for more than 2 hours, when on a trip.  Take a copy of your medical records when going on a trip.  Know where there is a hospital in the city you are visiting, in case of an  emergency.  Most dangerous household products will have pregnancy warnings on their labels. Ask your caregiver about products if you are unsure.  Limit or eliminate your caffeine intake from coffee, tea, sodas, medicines, and chocolate.  Many women continue working through pregnancy. Staying active might help you stay healthier. If you have a question about the safety or the hours you work at your particular job, talk with your caregiver.  Get informed:  Read books.  Watch videos.  Go to childbirth classes for you and the father.  Talk with experienced moms.  Ask your caregiver about childbirth education classes for you and your partner. Classes can help you and your partner prepare for the birth of your baby.  Ask about a pediatrician (baby doctor) and methods and pain medicine for labor, delivery, and possible Cesarean delivery (C-section). I AM NOT THINKING ABOUT GETTING PREGNANT  RIGHT NOW, BUT HEARD THAT ALL WOMEN SHOULD TAKE FOLIC ACID EVERY DAY?  All women of childbearing age, with even a remote chance of getting pregnant, should try to make sure they get enough folic acid. Many pregnancies are not planned. Many women do not know they are actually pregnant early in their pregnancies, and certain birth defects happen in the very early part of pregnancy. Taking 400 micrograms (mcg) of folic acid every day will help prevent certain birth defects that happen in the early part of pregnancy. If a woman begins taking vitamin pills in the second or third month of pregnancy, it may be too late to prevent birth defects. Folic acid may also have other health benefits for women, besides preventing birth defects.  HOW OFTEN SHOULD I SEE MY CAREGIVER DURING PREGNANCY?  Your caregiver will give you a schedule for your prenatal visits. You will have visits more often as you get closer to the end of your pregnancy. An average pregnancy lasts about 40 weeks.  A typical schedule includes visiting your caregiver:   About once each month, during your first 6 months of pregnancy.  Every 2 weeks, during the next 2 months.  Weekly in the last month, until the delivery date. Your caregiver will probably want to see you more often if:  You are over 35.  Your pregnancy is high risk, because you have certain health problems or problems with the pregnancy, such as:  Diabetes.  High blood pressure.  The baby is not growing on schedule, according to the dates of the pregnancy. Your caregiver will do special tests, to make sure you and the baby are not having any serious problems. WHAT HAPPENS DURING PRENATAL VISITS?   At your first prenatal visit, your caregiver will talk to you about you and your partner's health history and your family's health history, and will do a physical exam.  On your first visit, a physical exam will include checks of your blood pressure, height and weight, and an  exam of your pelvic organs. Your caregiver will do a Pap test if you have not had one recently, and will do cultures of your cervix to make sure there is no infection.  At each visit, there will be tests of your blood, urine, blood pressure, weight, and checking the progress of the baby.  Your caregiver will be able to tell you when to expect that your baby will be born.  Each visit is also a chance for you to learn about staying healthy during pregnancy and for asking questions.  Discuss whether you will be breastfeeding.  At your later prenatal  visits, your caregiver will check how you are doing and how the baby is developing. You may have a number of tests done as your pregnancy progresses.  Ultrasound exams are often used to check on the baby's growth and health.  You may have more urine and blood tests, as well as special tests, if needed. These may include amniocentesis (examine fluid in the pregnancy sac), stress tests (check how baby responds to contractions), biophysical profile (measures fetus well-being). Your caregiver will explain the tests and why they are necessary. I AM IN MY LATE THIRTIES, AND I WANT TO HAVE A CHILD NOW. SHOULD I DO ANYTHING SPECIAL?  As you get older, there is more chance of having a medical problem (high blood pressure), pregnancy problem (preeclampsia, problems with the placenta), miscarriage, or a baby born with a birth defect. However, most women in their late thirties and early forties have healthy babies. See your caregiver on a regular basis before you get pregnant and be sure to go for exams throughout your pregnancy. Your caregiver probably will want to do some special tests to check on you and your baby's health when you are pregnant.  Women today are often delaying having children until later in life, when they are in their thirties and forties. While many women in their thirties and forties have no difficulty getting pregnant, fertility does decline  with age. For women over 40 who cannot get pregnant after 6 months of trying, it is recommended that they see their caregiver for a fertility evaluation. It is not uncommon to have trouble becoming pregnant or experience infertility (inability to become pregnant after trying for one year). If you think that you or your partner may be infertile, you can discuss this with your caregiver. He or she can recommend treatments such as drugs, surgery, or assisted reproductive technology.  Document Released: 05/28/2003 Document Revised: 08/17/2011 Document Reviewed: 11/10/2012 [facility] Patient Information 2014 [place], [place]. Second Trimester of Pregnancy The second trimester is from week 13 through week 28, months 4 through 6. The second trimester is often a time when you feel your best. Your body has also adjusted to being pregnant, and you begin to feel better physically. Usually, morning sickness has lessened or quit completely, you may have more energy, and you may have an increase in appetite. The second trimester is also a time when the fetus is growing rapidly. At the end of the sixth month, the fetus is about 9 inches long and weighs about 1 pounds. You will likely begin to feel the baby move (quickening) between 18 and 20 weeks of the pregnancy. BODY CHANGES Your body goes through many changes during pregnancy. The changes vary from woman to woman.   Your weight will continue to increase. You will notice your lower abdomen bulging out.  You may begin to get stretch marks on your hips, abdomen, and breasts.  You may develop headaches that can be relieved by medicines approved by your caregiver.  You may urinate more often because the fetus is pressing on your bladder.  You may develop or continue to have heartburn as a result of your pregnancy.  You may develop constipation because certain hormones are causing the muscles that push waste through your intestines to slow down.  You may develop  hemorrhoids or swollen, bulging veins (varicose veins).  You may have back pain because of the weight gain and pregnancy hormones relaxing your joints between the bones in your pelvis and as a result of  a shift in weight and the muscles that support your balance.  Your breasts will continue to grow and be tender.  Your gums may bleed and may be sensitive to brushing and flossing.  Dark spots or blotches (chloasma, mask of pregnancy) may develop on your face. This will likely fade after the baby is born.  A dark line from your belly button to the pubic area (linea nigra) may appear. This will likely fade after the baby is born. WHAT TO EXPECT AT YOUR PRENATAL VISITS During a routine prenatal visit:  You will be weighed to make sure you and the fetus are growing normally.  Your blood pressure will be taken.  Your abdomen will be measured to track your baby's growth.  The fetal heartbeat will be listened to.  Any test results from the previous visit will be discussed. Your caregiver may ask you:  How you are feeling.  If you are feeling the baby move.  If you have had any abnormal symptoms, such as leaking fluid, bleeding, severe headaches, or abdominal cramping.  If you have any questions. Other tests that may be performed during your second trimester include:  Blood tests that check for:  Low iron levels (anemia).  Gestational diabetes (between 24 and 28 weeks).  Rh antibodies.  Urine tests to check for infections, diabetes, or protein in the urine.  An ultrasound to confirm the proper growth and development of the baby.  An amniocentesis to check for possible genetic problems.  Fetal screens for spina bifida and Down syndrome. HOME CARE INSTRUCTIONS   Avoid all smoking, herbs, alcohol, and unprescribed drugs. These chemicals affect the formation and growth of the baby.  Follow your caregiver's instructions regarding medicine use. There are medicines that are  either safe or unsafe to take during pregnancy.  Exercise only as directed by your caregiver. Experiencing uterine cramps is a good sign to stop exercising.  Continue to eat regular, healthy meals.  Wear a good support bra for breast tenderness.  Do not use hot tubs, steam rooms, or saunas.  Wear your seat belt at all times when driving.  Avoid raw meat, uncooked cheese, cat litter boxes, and soil used by cats. These carry germs that can cause birth defects in the baby.  Take your prenatal vitamins.  Try taking a stool softener (if your caregiver approves) if you develop constipation. Eat more high-fiber foods, such as fresh vegetables or fruit and whole grains. Drink plenty of fluids to keep your urine clear or pale yellow.  Take warm sitz baths to soothe any pain or discomfort caused by hemorrhoids. Use hemorrhoid cream if your caregiver approves.  If you develop varicose veins, wear support hose. Elevate your feet for 15 minutes, 3 4 times a day. Limit salt in your diet.  Avoid heavy lifting, wear low heel shoes, and practice good posture.  Rest with your legs elevated if you have leg cramps or low back pain.  Visit your dentist if you have not gone yet during your pregnancy. Use a soft toothbrush to brush your teeth and be gentle when you floss.  A sexual relationship may be continued unless your caregiver directs you otherwise.  Continue to go to all your prenatal visits as directed by your caregiver. SEEK MEDICAL CARE IF:   You have dizziness.  You have mild pelvic cramps, pelvic pressure, or nagging pain in the abdominal area.  You have persistent nausea, vomiting, or diarrhea.  You have a bad smelling vaginal discharge.  You have pain with urination. SEEK IMMEDIATE MEDICAL CARE IF:   You have a fever.  You are leaking fluid from your vagina.  You have spotting or bleeding from your vagina.  You have severe abdominal cramping or pain.  You have rapid weight  gain or loss.  You have shortness of breath with chest pain.  You notice sudden or extreme swelling of your face, hands, ankles, feet, or legs.  You have not felt your baby move in over an hour.  You have severe headaches that do not go away with medicine.  You have vision changes. Document Released: 05/19/2001 Document Revised: 01/25/2013 Document Reviewed: 07/26/2012 [facility] Patient Information 2014 [place], [place].

## 2013-04-12 NOTE — Progress Notes (Signed)
New OB. Routines reviewed. Will defer pelvic exam today secondary to known placenta previa.   See other note  Subjective:    Hannah Burns is a G1P0 [redacted]w[redacted]d being seen today for her first obstetrical visit.  Her obstetrical history is significant for placenta previa. Patient does intend to breast feed. Pregnancy history fully reviewed.  Patient reports no complaints.  Filed Vitals:   04/12/13 0837  BP: 135/77  Temp: 98.6 F (37 C)  Weight: 164 lb 14.4 oz (74.798 kg)    HISTORY: OB History  Gravida Para Term Preterm AB SAB TAB Ectopic Multiple Living  1             # Outcome Date GA Lbr Len/2nd Weight Sex Delivery Anes PTL Lv  1 CUR              History reviewed. No pertinent past medical history. History reviewed. No pertinent past surgical history. Family History  Problem Relation Age of Onset  . Diabetes Mother   . Diabetes Maternal Grandmother      Exam    Uterus:  Fundal Height: 17 cm  Pelvic Exam:    Perineum: N/a   Vulva: n/a   Vagina:  N/a   pH:    Cervix: Deferred due to placenta previa   Adnexa: not evaluated   Bony Pelvis: n/a  System: Breast:  normal appearance, no masses or tenderness   Skin: normal coloration and turgor, no rashes    Neurologic: oriented, grossly non-focal   Extremities: normal strength, tone, and muscle mass   HEENT neck supple with midline trachea   Mouth/Teeth mucous membranes moist, pharynx normal without lesions   Neck supple and no masses   Cardiovascular: regular rate and rhythm, no murmurs or gallops   Respiratory:  appears well, vitals normal, no respiratory distress, acyanotic, normal RR, ear and throat exam is normal, neck free of mass or lymphadenopathy, chest clear, no wheezing, crepitations, rhonchi, normal symmetric air entry   Abdomen: soft, non-tender; bowel sounds normal; no masses,  no organomegaly   Urinary: urethral meatus normal      Assessment:    Pregnancy: G1P0 Patient Active Problem List   Diagnosis  Date Noted  . Posterior placenta previa antepartum 03/23/2013        SIUP at [redacted]w[redacted]d     Plan:     Initial labs drawn. Prenatal vitamins. Problem list reviewed and updated. Genetic Screening discussed Quad Screen: ordered.  Ultrasound discussed; fetal survey: ordered.  Follow up in 4 weeks. 50% of 30 min visit spent on counseling and coordination of care.  Plan [place] to followup on previa and anatomy. Will do pelvic exam/pap after resolution   [facility] 04/12/2013

## 2013-04-12 NOTE — Progress Notes (Signed)
P= 68  Pt c/o of lower abd pain, desires flu vaccine today, early glucola

## 2013-04-13 LAB — OBSTETRIC PANEL
Basophils Absolute: 0 10*3/uL (ref 0.0–0.1)
Eosinophils Relative: 2 % (ref 0–5)
Hepatitis B Surface Ag: NEGATIVE
Lymphocytes Relative: 16 % (ref 12–46)
MCV: 90.3 fL (ref 78.0–100.0)
Neutro Abs: 7.4 10*3/uL (ref 1.7–7.7)
Neutrophils Relative %: 77 % (ref 43–77)
Platelets: 223 10*3/uL (ref 150–400)
RDW: 13.1 % (ref 11.5–15.5)
Rubella: 17.9 Index — ABNORMAL HIGH (ref <0.90)
WBC: 9.6 10*3/uL (ref 4.0–10.5)

## 2013-04-13 LAB — HIV ANTIBODY (ROUTINE TESTING W REFLEX): HIV: NONREACTIVE

## 2013-04-13 LAB — ALCOHOL METABOLITE (ETG), URINE: Ethyl Glucuronide (EtG): NEGATIVE ng/mL

## 2013-04-14 LAB — HEMOGLOBINOPATHY EVALUATION
Hgb F Quant: 0 % (ref 0.0–2.0)
Hgb S Quant: 0 %

## 2013-04-14 LAB — CANNABANOIDS (GC/LC/MS), URINE: THC-COOH (GC/LC/MS), ur confirm: 933 ng/mL — AB

## 2013-04-14 LAB — CULTURE, OB URINE

## 2013-04-15 LAB — PRESCRIPTION MONITORING PROFILE (19 PANEL)
Barbiturate Screen, Urine: NEGATIVE ng/mL
Benzodiazepine Screen, Urine: NEGATIVE ng/mL
Nitrites, Initial: NEGATIVE ug/mL
Opiate Screen, Urine: NEGATIVE ng/mL
Propoxyphene: NEGATIVE ng/mL
Tramadol Scrn, Ur: NEGATIVE ng/mL
Zolpidem, Urine: NEGATIVE ng/mL
pH, Initial: 7.8 pH (ref 4.5–8.9)

## 2013-04-17 LAB — AFP, QUAD SCREEN
AFP: 36.9 IU/mL
Down Syndrome Scr Risk Est: 1:11000 {titer}
HCG, Total: 18468 m[IU]/mL
INH: 205 pg/mL
Interpretation-AFP: NEGATIVE
MoM for INH: 1.17
Open Spina bifida: NEGATIVE
Osb Risk: 1:22800 {titer}
uE3 Mom: 0.51

## 2013-04-25 ENCOUNTER — Encounter: Payer: Self-pay | Admitting: Advanced Practice Midwife

## 2013-04-26 ENCOUNTER — Encounter: Payer: Self-pay | Admitting: *Deleted

## 2013-05-10 ENCOUNTER — Ambulatory Visit (INDEPENDENT_AMBULATORY_CARE_PROVIDER_SITE_OTHER): Payer: Medicaid Other | Admitting: Obstetrics and Gynecology

## 2013-05-10 VITALS — BP 119/74 | Wt 169.6 lb

## 2013-05-10 DIAGNOSIS — O210 Mild hyperemesis gravidarum: Secondary | ICD-10-CM

## 2013-05-10 DIAGNOSIS — Z3402 Encounter for supervision of normal first pregnancy, second trimester: Secondary | ICD-10-CM

## 2013-05-10 DIAGNOSIS — Z3400 Encounter for supervision of normal first pregnancy, unspecified trimester: Secondary | ICD-10-CM | POA: Insufficient documentation

## 2013-05-10 LAB — POCT URINALYSIS DIP (DEVICE)
Bilirubin Urine: NEGATIVE
Glucose, UA: NEGATIVE mg/dL
Hgb urine dipstick: NEGATIVE
Nitrite: NEGATIVE
Protein, ur: NEGATIVE mg/dL
Urobilinogen, UA: 0.2 mg/dL (ref 0.0–1.0)
pH: 6.5 (ref 5.0–8.0)

## 2013-05-10 NOTE — Progress Notes (Signed)
Nausea with  Vomiting since yesterday, no meds for nausea and requests> Rx Zofran. Mod LE> check C&S. Neg CVAT.

## 2013-05-10 NOTE — Progress Notes (Signed)
Pulse: 65

## 2013-05-10 NOTE — Patient Instructions (Signed)
Second Trimester of Pregnancy The second trimester is from week 13 through week 28, months 4 through 6. The second trimester is often a time when you feel your best. Your body has also adjusted to being pregnant, and you begin to feel better physically. Usually, morning sickness has lessened or quit completely, you may have more energy, and you may have an increase in appetite. The second trimester is also a time when the fetus is growing rapidly. At the end of the sixth month, the fetus is about 9 inches long and weighs about 1 pounds. You will likely begin to feel the baby move (quickening) between 18 and 20 weeks of the pregnancy. BODY CHANGES Your body goes through many changes during pregnancy. The changes vary from woman to woman.   Your weight will continue to increase. You will notice your lower abdomen bulging out.  You may begin to get stretch marks on your hips, abdomen, and breasts.  You may develop headaches that can be relieved by medicines approved by your caregiver.  You may urinate more often because the fetus is pressing on your bladder.  You may develop or continue to have heartburn as a result of your pregnancy.  You may develop constipation because certain hormones are causing the muscles that push waste through your intestines to slow down.  You may develop hemorrhoids or swollen, bulging veins (varicose veins).  You may have back pain because of the weight gain and pregnancy hormones relaxing your joints between the bones in your pelvis and as a result of a shift in weight and the muscles that support your balance.  Your breasts will continue to grow and be tender.  Your gums may bleed and may be sensitive to brushing and flossing.  Dark spots or blotches (chloasma, mask of pregnancy) may develop on your face. This will likely fade after the baby is born.  A dark line from your belly button to the pubic area (linea nigra) may appear. This will likely fade after the  baby is born. WHAT TO EXPECT AT YOUR PRENATAL VISITS During a routine prenatal visit:  You will be weighed to make sure you and the fetus are growing normally.  Your blood pressure will be taken.  Your abdomen will be measured to track your baby's growth.  The fetal heartbeat will be listened to.  Any test results from the previous visit will be discussed. Your caregiver may ask you:  How you are feeling.  If you are feeling the baby move.  If you have had any abnormal symptoms, such as leaking fluid, bleeding, severe headaches, or abdominal cramping.  If you have any questions. Other tests that may be performed during your second trimester include:  Blood tests that check for:  Low iron levels (anemia).  Gestational diabetes (between 24 and 28 weeks).  Rh antibodies.  Urine tests to check for infections, diabetes, or protein in the urine.  An ultrasound to confirm the proper growth and development of the baby.  An amniocentesis to check for possible genetic problems.  Fetal screens for spina bifida and Down syndrome. HOME CARE INSTRUCTIONS   Avoid all smoking, herbs, alcohol, and unprescribed drugs. These chemicals affect the formation and growth of the baby.  Follow your caregiver's instructions regarding medicine use. There are medicines that are either safe or unsafe to take during pregnancy.  Exercise only as directed by your caregiver. Experiencing uterine cramps is a good sign to stop exercising.  Continue to eat regular,   healthy meals.  Wear a good support bra for breast tenderness.  Do not use hot tubs, steam rooms, or saunas.  Wear your seat belt at all times when driving.  Avoid raw meat, uncooked cheese, cat litter boxes, and soil used by cats. These carry germs that can cause birth defects in the baby.  Take your prenatal vitamins.  Try taking a stool softener (if your caregiver approves) if you develop constipation. Eat more high-fiber foods,  such as fresh vegetables or fruit and whole grains. Drink plenty of fluids to keep your urine clear or pale yellow.  Take warm sitz baths to soothe any pain or discomfort caused by hemorrhoids. Use hemorrhoid cream if your caregiver approves.  If you develop varicose veins, wear support hose. Elevate your feet for 15 minutes, 3 4 times a day. Limit salt in your diet.  Avoid heavy lifting, wear low heel shoes, and practice good posture.  Rest with your legs elevated if you have leg cramps or low back pain.  Visit your dentist if you have not gone yet during your pregnancy. Use a soft toothbrush to brush your teeth and be gentle when you floss.  A sexual relationship may be continued unless your caregiver directs you otherwise.  Continue to go to all your prenatal visits as directed by your caregiver. SEEK MEDICAL CARE IF:   You have dizziness.  You have mild pelvic cramps, pelvic pressure, or nagging pain in the abdominal area.  You have persistent nausea, vomiting, or diarrhea.  You have a bad smelling vaginal discharge.  You have pain with urination. SEEK IMMEDIATE MEDICAL CARE IF:   You have a fever.  You are leaking fluid from your vagina.  You have spotting or bleeding from your vagina.  You have severe abdominal cramping or pain.  You have rapid weight gain or loss.  You have shortness of breath with chest pain.  You notice sudden or extreme swelling of your face, hands, ankles, feet, or legs.  You have not felt your baby move in over an hour.  You have severe headaches that do not go away with medicine.  You have vision changes. Document Released: 05/19/2001 Document Revised: 01/25/2013 Document Reviewed: 07/26/2012 [employer] Patient Information 2014 [employer].  

## 2013-05-17 ENCOUNTER — Ambulatory Visit (HOSPITAL_COMMUNITY): Payer: Self-pay

## 2013-05-18 ENCOUNTER — Ambulatory Visit (HOSPITAL_COMMUNITY)
Admission: RE | Admit: 2013-05-18 | Discharge: 2013-05-18 | Disposition: A | Discharge status: Home / Self Care | Payer: Medicaid Other | Source: Ambulatory Visit | Attending: Obstetrics and Gynecology | Admitting: Obstetrics and Gynecology

## 2013-05-18 DIAGNOSIS — Z3689 Encounter for other specified antenatal screening: Secondary | ICD-10-CM | POA: Insufficient documentation

## 2013-05-18 DIAGNOSIS — Z3402 Encounter for supervision of normal first pregnancy, second trimester: Secondary | ICD-10-CM

## 2013-05-18 DIAGNOSIS — O4402 Placenta previa specified as without hemorrhage, second trimester: Secondary | ICD-10-CM

## 2013-06-07 ENCOUNTER — Encounter: Payer: Self-pay | Admitting: Family Medicine

## 2013-06-07 ENCOUNTER — Ambulatory Visit (INDEPENDENT_AMBULATORY_CARE_PROVIDER_SITE_OTHER): Payer: Medicaid Other | Admitting: Family Medicine

## 2013-06-07 VITALS — BP 134/73 | Wt 177.6 lb

## 2013-06-07 DIAGNOSIS — O4402 Placenta previa specified as without hemorrhage, second trimester: Secondary | ICD-10-CM

## 2013-06-07 DIAGNOSIS — O4400 Placenta previa specified as without hemorrhage, unspecified trimester: Secondary | ICD-10-CM

## 2013-06-07 LAB — POCT URINALYSIS DIP (DEVICE)
Glucose, UA: NEGATIVE mg/dL
Ketones, ur: NEGATIVE mg/dL
Nitrite: NEGATIVE
Protein, ur: NEGATIVE mg/dL
Specific Gravity, Urine: 1.02 (ref 1.005–1.030)
Urobilinogen, UA: 1 mg/dL (ref 0.0–1.0)
pH: 7 (ref 5.0–8.0)

## 2013-06-07 NOTE — Progress Notes (Signed)
Pulse: 80 Was supposed to get an rx for Zofran last week, but rx not sent it. Still needs it.

## 2013-06-07 NOTE — Patient Instructions (Signed)
Second Trimester of Pregnancy The second trimester is from week 13 through week 28, months 4 through 6. The second trimester is often a time when you feel your best. Your body has also adjusted to being pregnant, and you begin to feel better physically. Usually, morning sickness has lessened or quit completely, you may have more energy, and you may have an increase in appetite. The second trimester is also a time when the fetus is growing rapidly. At the end of the sixth month, the fetus is about 9 inches long and weighs about 1 pounds. You will likely begin to feel the baby move (quickening) between 18 and 20 weeks of the pregnancy. BODY CHANGES Your body goes through many changes during pregnancy. The changes vary from woman to woman.   Your weight will continue to increase. You will notice your lower abdomen bulging out.  You may begin to get stretch marks on your hips, abdomen, and breasts.  You may develop headaches that can be relieved by medicines approved by your caregiver.  You may urinate more often because the fetus is pressing on your bladder.  You may develop or continue to have heartburn as a result of your pregnancy.  You may develop constipation because certain hormones are causing the muscles that push waste through your intestines to slow down.  You may develop hemorrhoids or swollen, bulging veins (varicose veins).  You may have back pain because of the weight gain and pregnancy hormones relaxing your joints between the bones in your pelvis and as a result of a shift in weight and the muscles that support your balance.  Your breasts will continue to grow and be tender.  Your gums may bleed and may be sensitive to brushing and flossing.  Dark spots or blotches (chloasma, mask of pregnancy) may develop on your face. This will likely fade after the baby is born.  A dark line from your belly button to the pubic area (linea nigra) may appear. This will likely fade after the  baby is born. WHAT TO EXPECT AT YOUR PRENATAL VISITS During a routine prenatal visit:  You will be weighed to make sure you and the fetus are growing normally.  Your blood pressure will be taken.  Your abdomen will be measured to track your baby's growth.  The fetal heartbeat will be listened to.  Any test results from the previous visit will be discussed. Your caregiver may ask you:  How you are feeling.  If you are feeling the baby move.  If you have had any abnormal symptoms, such as leaking fluid, bleeding, severe headaches, or abdominal cramping.  If you have any questions. Other tests that may be performed during your second trimester include:  Blood tests that check for:  Low iron levels (anemia).  Gestational diabetes (between 24 and 28 weeks).  Rh antibodies.  Urine tests to check for infections, diabetes, or protein in the urine.  An ultrasound to confirm the proper growth and development of the baby.  An amniocentesis to check for possible genetic problems.  Fetal screens for spina bifida and Down syndrome. HOME CARE INSTRUCTIONS   Avoid all smoking, herbs, alcohol, and unprescribed drugs. These chemicals affect the formation and growth of the baby.  Follow your caregiver's instructions regarding medicine use. There are medicines that are either safe or unsafe to take during pregnancy.  Exercise only as directed by your caregiver. Experiencing uterine cramps is a good sign to stop exercising.  Continue to eat regular,   healthy meals.  Wear a good support bra for breast tenderness.  Do not use hot tubs, steam rooms, or saunas.  Wear your seat belt at all times when driving.  Avoid raw meat, uncooked cheese, cat litter boxes, and soil used by cats. These carry germs that can cause birth defects in the baby.  Take your prenatal vitamins.  Try taking a stool softener (if your caregiver approves) if you develop constipation. Eat more high-fiber foods,  such as fresh vegetables or fruit and whole grains. Drink plenty of fluids to keep your urine clear or pale yellow.  Take warm sitz baths to soothe any pain or discomfort caused by hemorrhoids. Use hemorrhoid cream if your caregiver approves.  If you develop varicose veins, wear support hose. Elevate your feet for 15 minutes, 3 4 times a day. Limit salt in your diet.  Avoid heavy lifting, wear low heel shoes, and practice good posture.  Rest with your legs elevated if you have leg cramps or low back pain.  Visit your dentist if you have not gone yet during your pregnancy. Use a soft toothbrush to brush your teeth and be gentle when you floss.  A sexual relationship may be continued unless your caregiver directs you otherwise.  Continue to go to all your prenatal visits as directed by your caregiver. SEEK MEDICAL CARE IF:   You have dizziness.  You have mild pelvic cramps, pelvic pressure, or nagging pain in the abdominal area.  You have persistent nausea, vomiting, or diarrhea.  You have a bad smelling vaginal discharge.  You have pain with urination. SEEK IMMEDIATE MEDICAL CARE IF:   You have a fever.  You are leaking fluid from your vagina.  You have spotting or bleeding from your vagina.  You have severe abdominal cramping or pain.  You have rapid weight gain or loss.  You have shortness of breath with chest pain.  You notice sudden or extreme swelling of your face, hands, ankles, feet, or legs.  You have not felt your baby move in over an hour.  You have severe headaches that do not go away with medicine.  You have vision changes. Document Released: 05/19/2001 Document Revised: 01/25/2013 Document Reviewed: 07/26/2012 [employer] Patient Information 2014 [employer].  

## 2013-06-07 NOTE — Progress Notes (Signed)
No vb, no ctx, no lof, +FM  Hannah Burns is a 22 y.o. G1P0 at [redacted]w[redacted]d by L=13 here for ROB visit.  Discussed with Patient:  -Plans to breast/bottle feed.  All questions answered. -Continue prenatal vitamins. - Reviewed genetics screen (Quad screen / first trimester screen / serum integrated screen / full integrated screen done/ not done).   -Reviewed fetal kick counts (Pt to perform daily at a time when the baby is active, lie laterally with both hands on belly in quiet room and count all movements (hiccups, shoulder rolls, obvious kicks, etc); pt is to report to clinic or MAU for less than 10 movements felt in a one hour time period-pt told as soon as she counts 10 movements the count is complete.)  - Routine precautions discussed (depression, infection s/s).   Patient provided with all pertinent phone numbers for emergencies. - RTC for any VB, regular, painful cramps/ctxs occurring at a rate of >2/10 min, fever (100.5 or higher), n/v/d, any pain that is unresolving or worsening, LOF, decreased fetal movement, CP, SOB, edema  Problems: Patient Active Problem List   Diagnosis Date Noted  . Pregnancy, supervision, normal, first 05/10/2013  . Posterior placenta previa antepartum 03/23/2013    To Do: Next  Visit Tdap, glucola, CBC  [ ]  Vaccines: NWG:NFAO  Tdap:  [ ]  BCM: mirena  Edu: [x ] PTL precautions; [ ]  BF class; [ ]  childbirth class; [ ]   BF counseling;

## 2013-06-08 NOTE — L&D Delivery Note (Signed)
Delivery Note Pt progressed quickly through the active stage of labor.  After a 10 minute second stage, at 23:17  a viable female was delivered via  (Presentation: LOA ).  APGAR: 9/9 ; weight pending.  40 units of pitocin diluted in 1000cc LR was infused rapidly IV.  The placenta separated spontaneously and delivered via CCT and maternal pushing effort.  It was inspected and appears to be intact with a 3 VC.  There were the following complications: none  Anesthesia: Epidural  Episiotomy: none Lacerations: 1st degree Left labial minora lac not needing repair Suture Repair: n/a Est. Blood Loss (mL): 150  Mom to postpartum.  Baby to Couplet care / Skin to Skin.

## 2013-07-05 ENCOUNTER — Ambulatory Visit (INDEPENDENT_AMBULATORY_CARE_PROVIDER_SITE_OTHER): Payer: BC Managed Care – PPO | Admitting: Family Medicine

## 2013-07-05 VITALS — BP 120/66 | Temp 96.2°F | Wt 186.4 lb

## 2013-07-05 DIAGNOSIS — Z23 Encounter for immunization: Secondary | ICD-10-CM

## 2013-07-05 DIAGNOSIS — O4400 Placenta previa specified as without hemorrhage, unspecified trimester: Secondary | ICD-10-CM

## 2013-07-05 DIAGNOSIS — O4410 Placenta previa with hemorrhage, unspecified trimester: Secondary | ICD-10-CM

## 2013-07-05 LAB — POCT URINALYSIS DIP (DEVICE)
Bilirubin Urine: NEGATIVE
GLUCOSE, UA: NEGATIVE mg/dL
Hgb urine dipstick: NEGATIVE
KETONES UR: NEGATIVE mg/dL
Nitrite: NEGATIVE
PH: 6 (ref 5.0–8.0)
Protein, ur: NEGATIVE mg/dL
Specific Gravity, Urine: <=1.005 (ref 1.005–1.030)
Urobilinogen, UA: 0.2 mg/dL (ref 0.0–1.0)

## 2013-07-05 LAB — CBC
HEMATOCRIT: 33.4 % — AB (ref 36.0–46.0)
HEMOGLOBIN: 11.5 g/dL — AB (ref 12.0–15.0)
MCH: 31.3 pg (ref 26.0–34.0)
MCHC: 34.4 g/dL (ref 30.0–36.0)
MCV: 90.8 fL (ref 78.0–100.0)
Platelets: 164 10*3/uL (ref 150–400)
RBC: 3.68 MIL/uL — AB (ref 3.87–5.11)
RDW: 12.9 % (ref 11.5–15.5)
WBC: 8.6 10*3/uL (ref 4.0–10.5)

## 2013-07-05 MED ORDER — PRENATAL MULTIVITAMIN CH: 1.0000 | ORAL_TABLET | Freq: Every day | ORAL | Status: DC

## 2013-07-05 MED ORDER — TETANUS-DIPHTH-ACELL PERTUSSIS 5-2.5-18.5 LF-MCG/0.5 IM SUSP: 0.5000 mL | Freq: Once | INTRAMUSCULAR | Status: DC

## 2013-07-05 MED ORDER — ONDANSETRON 4 MG PO TBDP: 4.0000 mg | ORAL_TABLET | Freq: Three times a day (TID) | ORAL | Status: DC | PRN

## 2013-07-05 NOTE — Progress Notes (Signed)
+  FM, no lof, no ctx, no vb  [name] is a 23 y.o. G1P0 at 532w5d by L=13 here for ROB visit.  Discussed with Patient:  -Plans to breast/bottle  feed.  All questions answered. -Continue prenatal vitamins. - Reviewed genetics screen (Quad screen / first trimester screen / serum integrated screen / full integrated screen done/ not done).   -Reviewed fetal kick counts (Pt to perform daily at a time when the baby is active, lie laterally with both hands on belly in quiet room and count all movements (hiccups, shoulder rolls, obvious kicks, etc); pt is to report to clinic or MAU for less than 10 movements felt in a one hour time period-pt told as soon as she counts 10 movements the count is complete.)  - Routine precautions discussed (depression, infection s/s).   Patient provided with all pertinent phone numbers for emergencies. - RTC for any VB, regular, painful cramps/ctxs occurring at a rate of >2/10 min, fever (100.5 or higher), n/v/d, any pain that is unresolving or worsening, LOF, decreased fetal movement, CP, SOB, edema  Problems: Patient Active Problem List   Diagnosis Date Noted  . Pregnancy, supervision, normal, first 05/10/2013  . Posterior placenta previa antepartum 03/23/2013    To Do: 1. Glucose tolerance test ordered.  Patient will draw in clinic.  Will f/u test and amend plan based on results. 2. CBC and antibody screen ordered.  Edu: [ ]  PTL precautions; [ ]  BF class; [ ]  childbirth class; [ ]   BF counseling;

## 2013-07-05 NOTE — Patient Instructions (Signed)
Third Trimester of Pregnancy  The third trimester is from week 29 through week 42, months 7 through 9. The third trimester is a time when the fetus is growing rapidly. At the end of the ninth month, the fetus is about 20 inches in length and weighs 6 10 pounds.   BODY CHANGES  Your body goes through many changes during pregnancy. The changes vary from woman to woman.    Your weight will continue to increase. You can expect to gain 25 35 pounds (11 16 kg) by the end of the pregnancy.   You may begin to get stretch marks on your hips, abdomen, and breasts.   You may urinate more often because the fetus is moving lower into your pelvis and pressing on your bladder.   You may develop or continue to have heartburn as a result of your pregnancy.   You may develop constipation because certain hormones are causing the muscles that push waste through your intestines to slow down.   You may develop hemorrhoids or swollen, bulging veins (varicose veins).   You may have pelvic pain because of the weight gain and pregnancy hormones relaxing your joints between the bones in your pelvis. Back aches may result from over exertion of the muscles supporting your posture.   Your breasts will continue to grow and be tender. A yellow discharge may leak from your breasts called colostrum.   Your belly button may stick out.   You may feel short of breath because of your expanding uterus.   You may notice the fetus "dropping," or moving lower in your abdomen.   You may have a bloody mucus discharge. This usually occurs a few days to a week before labor begins.   Your cervix becomes thin and soft (effaced) near your due date.  WHAT TO EXPECT AT YOUR PRENATAL EXAMS   You will have prenatal exams every 2 weeks until week 36. Then, you will have weekly prenatal exams. During a routine prenatal visit:   You will be weighed to make sure you and the fetus are growing normally.   Your blood pressure is taken.   Your abdomen will be  measured to track your baby's growth.   The fetal heartbeat will be listened to.   Any test results from the previous visit will be discussed.   You may have a cervical check near your due date to see if you have effaced.  At around 36 weeks, your caregiver will check your cervix. At the same time, your caregiver will also perform a test on the secretions of the vaginal tissue. This test is to determine if a type of bacteria, Group B streptococcus, is present. Your caregiver will explain this further.  Your caregiver may ask you:   What your birth plan is.   How you are feeling.   If you are feeling the baby move.   If you have had any abnormal symptoms, such as leaking fluid, bleeding, severe headaches, or abdominal cramping.   If you have any questions.  Other tests or screenings that may be performed during your third trimester include:   Blood tests that check for low iron levels (anemia).   Fetal testing to check the health, activity level, and growth of the fetus. Testing is done if you have certain medical conditions or if there are problems during the pregnancy.  FALSE LABOR  You may feel small, irregular contractions that eventually go away. These are called Braxton Hicks contractions, or   false labor. Contractions may last for hours, days, or even weeks before true labor sets in. If contractions come at regular intervals, intensify, or become painful, it is best to be seen by your caregiver.   SIGNS OF LABOR    Menstrual-like cramps.   Contractions that are 5 minutes apart or less.   Contractions that start on the top of the uterus and spread down to the lower abdomen and back.   A sense of increased pelvic pressure or back pain.   A watery or bloody mucus discharge that comes from the vagina.  If you have any of these signs before the 37th week of pregnancy, call your caregiver right away. You need to go to the hospital to get checked immediately.  HOME CARE INSTRUCTIONS    Avoid all  smoking, herbs, alcohol, and unprescribed drugs. These chemicals affect the formation and growth of the baby.   Follow your caregiver's instructions regarding medicine use. There are medicines that are either safe or unsafe to take during pregnancy.   Exercise only as directed by your caregiver. Experiencing uterine cramps is a good sign to stop exercising.   Continue to eat regular, healthy meals.   Wear a good support bra for breast tenderness.   Do not use hot tubs, steam rooms, or saunas.   Wear your seat belt at all times when driving.   Avoid raw meat, uncooked cheese, cat litter boxes, and soil used by cats. These carry germs that can cause birth defects in the baby.   Take your prenatal vitamins.   Try taking a stool softener (if your caregiver approves) if you develop constipation. Eat more high-fiber foods, such as fresh vegetables or fruit and whole grains. Drink plenty of fluids to keep your urine clear or pale yellow.   Take warm sitz baths to soothe any pain or discomfort caused by hemorrhoids. Use hemorrhoid cream if your caregiver approves.   If you develop varicose veins, wear support hose. Elevate your feet for 15 minutes, 3 4 times a day. Limit salt in your diet.   Avoid heavy lifting, wear low heal shoes, and practice good posture.   Rest a lot with your legs elevated if you have leg cramps or low back pain.   Visit your dentist if you have not gone during your pregnancy. Use a soft toothbrush to brush your teeth and be gentle when you floss.   A sexual relationship may be continued unless your caregiver directs you otherwise.   Do not travel far distances unless it is absolutely necessary and only with the approval of your caregiver.   Take prenatal classes to understand, practice, and ask questions about the labor and delivery.   Make a trial run to the hospital.   Pack your hospital bag.   Prepare the baby's nursery.   Continue to go to all your prenatal visits as directed  by your caregiver.  SEEK MEDICAL CARE IF:   You are unsure if you are in labor or if your water has broken.   You have dizziness.   You have mild pelvic cramps, pelvic pressure, or nagging pain in your abdominal area.   You have persistent nausea, vomiting, or diarrhea.   You have a bad smelling vaginal discharge.   You have pain with urination.  SEEK IMMEDIATE MEDICAL CARE IF:    You have a fever.   You are leaking fluid from your vagina.   You have spotting or bleeding from your vagina.     You have severe abdominal cramping or pain.   You have rapid weight loss or gain.   You have shortness of breath with chest pain.   You notice sudden or extreme swelling of your face, hands, ankles, feet, or legs.   You have not felt your baby move in over an hour.   You have severe headaches that do not go away with medicine.   You have vision changes.  Document Released: 05/19/2001 Document Revised: 01/25/2013 Document Reviewed: 07/26/2012  [employer] Patient Information 2014 [employer].

## 2013-07-05 NOTE — Progress Notes (Signed)
Pulse- 75 Patient is requesting medication for nausea & refills on PNV

## 2013-07-06 LAB — GLUCOSE TOLERANCE, 1 HOUR (50G) W/O FASTING: Glucose, 1 Hour GTT: 92 mg/dL (ref 70–140)

## 2013-07-06 LAB — RPR

## 2013-07-06 LAB — HIV ANTIBODY (ROUTINE TESTING W REFLEX): HIV: NONREACTIVE

## 2013-07-19 ENCOUNTER — Encounter: Payer: Self-pay | Admitting: Family Medicine

## 2013-07-19 ENCOUNTER — Ambulatory Visit (INDEPENDENT_AMBULATORY_CARE_PROVIDER_SITE_OTHER): Payer: BC Managed Care – PPO | Admitting: Family Medicine

## 2013-07-19 VITALS — BP 111/62 | Wt 189.9 lb

## 2013-07-19 DIAGNOSIS — Z3400 Encounter for supervision of normal first pregnancy, unspecified trimester: Secondary | ICD-10-CM

## 2013-07-19 DIAGNOSIS — O4410 Placenta previa with hemorrhage, unspecified trimester: Secondary | ICD-10-CM

## 2013-07-19 DIAGNOSIS — O4400 Placenta previa specified as without hemorrhage, unspecified trimester: Secondary | ICD-10-CM

## 2013-07-19 LAB — POCT URINALYSIS DIP (DEVICE)
Bilirubin Urine: NEGATIVE
Glucose, UA: NEGATIVE mg/dL
HGB URINE DIPSTICK: NEGATIVE
KETONES UR: NEGATIVE mg/dL
Nitrite: NEGATIVE
PH: 7.5 (ref 5.0–8.0)
Protein, ur: NEGATIVE mg/dL
Specific Gravity, Urine: 1.02 (ref 1.005–1.030)
UROBILINOGEN UA: 1 mg/dL (ref 0.0–1.0)

## 2013-07-19 NOTE — Progress Notes (Signed)
Pulse: 67

## 2013-07-19 NOTE — Progress Notes (Signed)
+  FM no lof, no vb, no ctx  [name] is a 23 y.o. G1P0 at 2367w5d here for ROB visit.  Discussed with Patient:  -Plans to breast feed.  All questions answered. -Continue prenatal vitamins. -Reviewed fetal kick counts (Pt to perform daily at a time when the baby is active, lie laterally with both hands on belly in quiet room and count all movements (hiccups, shoulder rolls, obvious kicks, etc); pt is to report to clinic or MAU for less than 10 movements felt in a one hour time period-pt told as soon as she counts 10 movements the count is complete.)  - Routine precautions discussed (depression, infection s/s).   Patient provided with all pertinent phone numbers for emergencies. - RTC for any VB, regular, painful cramps/ctxs occurring at a rate of >2/10 min, fever (100.5 or higher), n/v/d, any pain that is unresolving or worsening, LOF, decreased fetal movement, CP, SOB, edema - f/u appt 2 wks  Problems: Patient Active Problem List   Diagnosis Date Noted  . Pregnancy, supervision, normal, first 05/10/2013  . Posterior placenta previa antepartum 03/23/2013    To Do:   [ x] Vaccines: Flu: recd Tdap: recd [x]  BCM: mirena  Edu: [x ] PTL precautions; [ ]  BF class; [ ]  childbirth class; [ ]   BF counseling;

## 2013-07-19 NOTE — Patient Instructions (Signed)
Third Trimester of Pregnancy  The third trimester is from week 29 through week 42, months 7 through 9. The third trimester is a time when the fetus is growing rapidly. At the end of the ninth month, the fetus is about 20 inches in length and weighs 6 10 pounds.   BODY CHANGES  Your body goes through many changes during pregnancy. The changes vary from woman to woman.    Your weight will continue to increase. You can expect to gain 25 35 pounds (11 16 kg) by the end of the pregnancy.   You may begin to get stretch marks on your hips, abdomen, and breasts.   You may urinate more often because the fetus is moving lower into your pelvis and pressing on your bladder.   You may develop or continue to have heartburn as a result of your pregnancy.   You may develop constipation because certain hormones are causing the muscles that push waste through your intestines to slow down.   You may develop hemorrhoids or swollen, bulging veins (varicose veins).   You may have pelvic pain because of the weight gain and pregnancy hormones relaxing your joints between the bones in your pelvis. Back aches may result from over exertion of the muscles supporting your posture.   Your breasts will continue to grow and be tender. A yellow discharge may leak from your breasts called colostrum.   Your belly button may stick out.   You may feel short of breath because of your expanding uterus.   You may notice the fetus "dropping," or moving lower in your abdomen.   You may have a bloody mucus discharge. This usually occurs a few days to a week before labor begins.   Your cervix becomes thin and soft (effaced) near your due date.  WHAT TO EXPECT AT YOUR PRENATAL EXAMS   You will have prenatal exams every 2 weeks until week 36. Then, you will have weekly prenatal exams. During a routine prenatal visit:   You will be weighed to make sure you and the fetus are growing normally.   Your blood pressure is taken.   Your abdomen will be  measured to track your baby's growth.   The fetal heartbeat will be listened to.   Any test results from the previous visit will be discussed.   You may have a cervical check near your due date to see if you have effaced.  At around 36 weeks, your caregiver will check your cervix. At the same time, your caregiver will also perform a test on the secretions of the vaginal tissue. This test is to determine if a type of bacteria, Group B streptococcus, is present. Your caregiver will explain this further.  Your caregiver may ask you:   What your birth plan is.   How you are feeling.   If you are feeling the baby move.   If you have had any abnormal symptoms, such as leaking fluid, bleeding, severe headaches, or abdominal cramping.   If you have any questions.  Other tests or screenings that may be performed during your third trimester include:   Blood tests that check for low iron levels (anemia).   Fetal testing to check the health, activity level, and growth of the fetus. Testing is done if you have certain medical conditions or if there are problems during the pregnancy.  FALSE LABOR  You may feel small, irregular contractions that eventually go away. These are called Braxton Hicks contractions, or   false labor. Contractions may last for hours, days, or even weeks before true labor sets in. If contractions come at regular intervals, intensify, or become painful, it is best to be seen by your caregiver.   SIGNS OF LABOR    Menstrual-like cramps.   Contractions that are 5 minutes apart or less.   Contractions that start on the top of the uterus and spread down to the lower abdomen and back.   A sense of increased pelvic pressure or back pain.   A watery or bloody mucus discharge that comes from the vagina.  If you have any of these signs before the 37th week of pregnancy, call your caregiver right away. You need to go to the hospital to get checked immediately.  HOME CARE INSTRUCTIONS    Avoid all  smoking, herbs, alcohol, and unprescribed drugs. These chemicals affect the formation and growth of the baby.   Follow your caregiver's instructions regarding medicine use. There are medicines that are either safe or unsafe to take during pregnancy.   Exercise only as directed by your caregiver. Experiencing uterine cramps is a good sign to stop exercising.   Continue to eat regular, healthy meals.   Wear a good support bra for breast tenderness.   Do not use hot tubs, steam rooms, or saunas.   Wear your seat belt at all times when driving.   Avoid raw meat, uncooked cheese, cat litter boxes, and soil used by cats. These carry germs that can cause birth defects in the baby.   Take your prenatal vitamins.   Try taking a stool softener (if your caregiver approves) if you develop constipation. Eat more high-fiber foods, such as fresh vegetables or fruit and whole grains. Drink plenty of fluids to keep your urine clear or pale yellow.   Take warm sitz baths to soothe any pain or discomfort caused by hemorrhoids. Use hemorrhoid cream if your caregiver approves.   If you develop varicose veins, wear support hose. Elevate your feet for 15 minutes, 3 4 times a day. Limit salt in your diet.   Avoid heavy lifting, wear low heal shoes, and practice good posture.   Rest a lot with your legs elevated if you have leg cramps or low back pain.   Visit your dentist if you have not gone during your pregnancy. Use a soft toothbrush to brush your teeth and be gentle when you floss.   A sexual relationship may be continued unless your caregiver directs you otherwise.   Do not travel far distances unless it is absolutely necessary and only with the approval of your caregiver.   Take prenatal classes to understand, practice, and ask questions about the labor and delivery.   Make a trial run to the hospital.   Pack your hospital bag.   Prepare the baby's nursery.   Continue to go to all your prenatal visits as directed  by your caregiver.  SEEK MEDICAL CARE IF:   You are unsure if you are in labor or if your water has broken.   You have dizziness.   You have mild pelvic cramps, pelvic pressure, or nagging pain in your abdominal area.   You have persistent nausea, vomiting, or diarrhea.   You have a bad smelling vaginal discharge.   You have pain with urination.  SEEK IMMEDIATE MEDICAL CARE IF:    You have a fever.   You are leaking fluid from your vagina.   You have spotting or bleeding from your vagina.     You have severe abdominal cramping or pain.   You have rapid weight loss or gain.   You have shortness of breath with chest pain.   You notice sudden or extreme swelling of your face, hands, ankles, feet, or legs.   You have not felt your baby move in over an hour.   You have severe headaches that do not go away with medicine.   You have vision changes.  Document Released: 05/19/2001 Document Revised: 01/25/2013 Document Reviewed: 07/26/2012  [employer] Patient Information 2014 [employer].

## 2013-08-02 ENCOUNTER — Ambulatory Visit (INDEPENDENT_AMBULATORY_CARE_PROVIDER_SITE_OTHER): Payer: Medicaid Other | Admitting: Family Medicine

## 2013-08-02 ENCOUNTER — Encounter: Payer: Self-pay | Admitting: Family Medicine

## 2013-08-02 VITALS — BP 113/75 | Temp 97.6°F | Wt 191.6 lb

## 2013-08-02 DIAGNOSIS — Z3400 Encounter for supervision of normal first pregnancy, unspecified trimester: Secondary | ICD-10-CM

## 2013-08-02 LAB — POCT URINALYSIS DIP (DEVICE)
Bilirubin Urine: NEGATIVE
GLUCOSE, UA: NEGATIVE mg/dL
HGB URINE DIPSTICK: NEGATIVE
Ketones, ur: NEGATIVE mg/dL
NITRITE: NEGATIVE
PH: 7.5 (ref 5.0–8.0)
Protein, ur: NEGATIVE mg/dL
SPECIFIC GRAVITY, URINE: 1.015 (ref 1.005–1.030)
UROBILINOGEN UA: 0.2 mg/dL (ref 0.0–1.0)

## 2013-08-02 NOTE — Patient Instructions (Signed)
Third Trimester of Pregnancy  The third trimester is from week 29 through week 42, months 7 through 9. The third trimester is a time when the fetus is growing rapidly. At the end of the ninth month, the fetus is about 20 inches in length and weighs 6 10 pounds.   BODY CHANGES  Your body goes through many changes during pregnancy. The changes vary from woman to woman.    Your weight will continue to increase. You can expect to gain 25 35 pounds (11 16 kg) by the end of the pregnancy.   You may begin to get stretch marks on your hips, abdomen, and breasts.   You may urinate more often because the fetus is moving lower into your pelvis and pressing on your bladder.   You may develop or continue to have heartburn as a result of your pregnancy.   You may develop constipation because certain hormones are causing the muscles that push waste through your intestines to slow down.   You may develop hemorrhoids or swollen, bulging veins (varicose veins).   You may have pelvic pain because of the weight gain and pregnancy hormones relaxing your joints between the bones in your pelvis. Back aches may result from over exertion of the muscles supporting your posture.   Your breasts will continue to grow and be tender. A yellow discharge may leak from your breasts called colostrum.   Your belly button may stick out.   You may feel short of breath because of your expanding uterus.   You may notice the fetus "dropping," or moving lower in your abdomen.   You may have a bloody mucus discharge. This usually occurs a few days to a week before labor begins.   Your cervix becomes thin and soft (effaced) near your due date.  WHAT TO EXPECT AT YOUR PRENATAL EXAMS   You will have prenatal exams every 2 weeks until week 36. Then, you will have weekly prenatal exams. During a routine prenatal visit:   You will be weighed to make sure you and the fetus are growing normally.   Your blood pressure is taken.   Your abdomen will be  measured to track your baby's growth.   The fetal heartbeat will be listened to.   Any test results from the previous visit will be discussed.   You may have a cervical check near your due date to see if you have effaced.  At around 36 weeks, your caregiver will check your cervix. At the same time, your caregiver will also perform a test on the secretions of the vaginal tissue. This test is to determine if a type of bacteria, Group B streptococcus, is present. Your caregiver will explain this further.  Your caregiver may ask you:   What your birth plan is.   How you are feeling.   If you are feeling the baby move.   If you have had any abnormal symptoms, such as leaking fluid, bleeding, severe headaches, or abdominal cramping.   If you have any questions.  Other tests or screenings that may be performed during your third trimester include:   Blood tests that check for low iron levels (anemia).   Fetal testing to check the health, activity level, and growth of the fetus. Testing is done if you have certain medical conditions or if there are problems during the pregnancy.  FALSE LABOR  You may feel small, irregular contractions that eventually go away. These are called Braxton Hicks contractions, or   false labor. Contractions may last for hours, days, or even weeks before true labor sets in. If contractions come at regular intervals, intensify, or become painful, it is best to be seen by your caregiver.   SIGNS OF LABOR    Menstrual-like cramps.   Contractions that are 5 minutes apart or less.   Contractions that start on the top of the uterus and spread down to the lower abdomen and back.   A sense of increased pelvic pressure or back pain.   A watery or bloody mucus discharge that comes from the vagina.  If you have any of these signs before the 37th week of pregnancy, call your caregiver right away. You need to go to the hospital to get checked immediately.  HOME CARE INSTRUCTIONS    Avoid all  smoking, herbs, alcohol, and unprescribed drugs. These chemicals affect the formation and growth of the baby.   Follow your caregiver's instructions regarding medicine use. There are medicines that are either safe or unsafe to take during pregnancy.   Exercise only as directed by your caregiver. Experiencing uterine cramps is a good sign to stop exercising.   Continue to eat regular, healthy meals.   Wear a good support bra for breast tenderness.   Do not use hot tubs, steam rooms, or saunas.   Wear your seat belt at all times when driving.   Avoid raw meat, uncooked cheese, cat litter boxes, and soil used by cats. These carry germs that can cause birth defects in the baby.   Take your prenatal vitamins.   Try taking a stool softener (if your caregiver approves) if you develop constipation. Eat more high-fiber foods, such as fresh vegetables or fruit and whole grains. Drink plenty of fluids to keep your urine clear or pale yellow.   Take warm sitz baths to soothe any pain or discomfort caused by hemorrhoids. Use hemorrhoid cream if your caregiver approves.   If you develop varicose veins, wear support hose. Elevate your feet for 15 minutes, 3 4 times a day. Limit salt in your diet.   Avoid heavy lifting, wear low heal shoes, and practice good posture.   Rest a lot with your legs elevated if you have leg cramps or low back pain.   Visit your dentist if you have not gone during your pregnancy. Use a soft toothbrush to brush your teeth and be gentle when you floss.   A sexual relationship may be continued unless your caregiver directs you otherwise.   Do not travel far distances unless it is absolutely necessary and only with the approval of your caregiver.   Take prenatal classes to understand, practice, and ask questions about the labor and delivery.   Make a trial run to the hospital.   Pack your hospital bag.   Prepare the baby's nursery.   Continue to go to all your prenatal visits as directed  by your caregiver.  SEEK MEDICAL CARE IF:   You are unsure if you are in labor or if your water has broken.   You have dizziness.   You have mild pelvic cramps, pelvic pressure, or nagging pain in your abdominal area.   You have persistent nausea, vomiting, or diarrhea.   You have a bad smelling vaginal discharge.   You have pain with urination.  SEEK IMMEDIATE MEDICAL CARE IF:    You have a fever.   You are leaking fluid from your vagina.   You have spotting or bleeding from your vagina.     You have severe abdominal cramping or pain.   You have rapid weight loss or gain.   You have shortness of breath with chest pain.   You notice sudden or extreme swelling of your face, hands, ankles, feet, or legs.   You have not felt your baby move in over an hour.   You have severe headaches that do not go away with medicine.   You have vision changes.  Document Released: 05/19/2001 Document Revised: 01/25/2013 Document Reviewed: 07/26/2012  [employer] Patient Information 2014 [employer].

## 2013-08-02 NOTE — Progress Notes (Signed)
P=82, c/o  Pelvic pressure and contractions occasionally

## 2013-08-02 NOTE — Progress Notes (Signed)
+  FM, no lof, no vb, no ctx [place] Katrinka BlazingSmith is a 23 y.o. G1P0 at 5271w5d by L=13 here for ROB visit.  Discussed with Patient:  -Plans to  breast/bottle feed.  All questions answered. -Continue prenatal vitamins. -Reviewed fetal kick counts Pt to perform daily at a time when the baby is active, lie laterally with both hands on belly in quiet room and count all movements (hiccups, shoulder rolls, obvious kicks, etc); pt is to report to clinic L&D for less than 10 movements felt in a one hour time period-pt told as soon as she counts 10 movements the count is complete.  - Routine precautions discussed (depression, infection s/s).   Patient provided with all pertinent phone numbers for emergencies. - RTC for any VB, regular, painful cramps/ctxs occurring at a rate of >2/10 min, fever (100.5 or higher), n/v/d, any pain that is unresolving or worsening, LOF, decreased fetal movement, CP, SOB, edema - RTC in 2 weeks for next appt.  Problems: Patient Active Problem List   Diagnosis Date Noted  . Pregnancy, supervision, normal, first 05/10/2013  . Posterior placenta previa antepartum 03/23/2013    To Do: 1. [ ]  Vaccines:recd [ ]  BCM: mirena [x ] Readiness: baby has a place to sleep, car seat, other baby necessities.  Edu: [x ] PTL precautions; [ ]  BF class; [ ]  childbirth class; [ ]   BF counseling;

## 2013-08-16 ENCOUNTER — Encounter: Payer: Self-pay | Admitting: Family Medicine

## 2013-08-16 ENCOUNTER — Ambulatory Visit (INDEPENDENT_AMBULATORY_CARE_PROVIDER_SITE_OTHER): Payer: Medicaid Other | Admitting: Family Medicine

## 2013-08-16 VITALS — BP 122/61 | Temp 98.2°F | Wt 201.2 lb

## 2013-08-16 DIAGNOSIS — O4400 Placenta previa specified as without hemorrhage, unspecified trimester: Secondary | ICD-10-CM

## 2013-08-16 DIAGNOSIS — Z3400 Encounter for supervision of normal first pregnancy, unspecified trimester: Secondary | ICD-10-CM

## 2013-08-16 DIAGNOSIS — O4410 Placenta previa with hemorrhage, unspecified trimester: Secondary | ICD-10-CM

## 2013-08-16 LAB — POCT URINALYSIS DIP (DEVICE)
Bilirubin Urine: NEGATIVE
Glucose, UA: NEGATIVE mg/dL
Hgb urine dipstick: NEGATIVE
Ketones, ur: NEGATIVE mg/dL
NITRITE: NEGATIVE
PH: 6.5 (ref 5.0–8.0)
PROTEIN: NEGATIVE mg/dL
Specific Gravity, Urine: 1.015 (ref 1.005–1.030)
Urobilinogen, UA: 0.2 mg/dL (ref 0.0–1.0)

## 2013-08-16 NOTE — Progress Notes (Signed)
P-70 

## 2013-08-16 NOTE — Patient Instructions (Signed)
Third Trimester of Pregnancy  The third trimester is from week 29 through week 42, months 7 through 9. The third trimester is a time when the fetus is growing rapidly. At the end of the ninth month, the fetus is about 20 inches in length and weighs 6 10 pounds.   BODY CHANGES  Your body goes through many changes during pregnancy. The changes vary from woman to woman.    Your weight will continue to increase. You can expect to gain 25 35 pounds (11 16 kg) by the end of the pregnancy.   You may begin to get stretch marks on your hips, abdomen, and breasts.   You may urinate more often because the fetus is moving lower into your pelvis and pressing on your bladder.   You may develop or continue to have heartburn as a result of your pregnancy.   You may develop constipation because certain hormones are causing the muscles that push waste through your intestines to slow down.   You may develop hemorrhoids or swollen, bulging veins (varicose veins).   You may have pelvic pain because of the weight gain and pregnancy hormones relaxing your joints between the bones in your pelvis. Back aches may result from over exertion of the muscles supporting your posture.   Your breasts will continue to grow and be tender. A yellow discharge may leak from your breasts called colostrum.   Your belly button may stick out.   You may feel short of breath because of your expanding uterus.   You may notice the fetus "dropping," or moving lower in your abdomen.   You may have a bloody mucus discharge. This usually occurs a few days to a week before labor begins.   Your cervix becomes thin and soft (effaced) near your due date.  WHAT TO EXPECT AT YOUR PRENATAL EXAMS   You will have prenatal exams every 2 weeks until week 36. Then, you will have weekly prenatal exams. During a routine prenatal visit:   You will be weighed to make sure you and the fetus are growing normally.   Your blood pressure is taken.   Your abdomen will be  measured to track your baby's growth.   The fetal heartbeat will be listened to.   Any test results from the previous visit will be discussed.   You may have a cervical check near your due date to see if you have effaced.  At around 36 weeks, your caregiver will check your cervix. At the same time, your caregiver will also perform a test on the secretions of the vaginal tissue. This test is to determine if a type of bacteria, Group B streptococcus, is present. Your caregiver will explain this further.  Your caregiver may ask you:   What your birth plan is.   How you are feeling.   If you are feeling the baby move.   If you have had any abnormal symptoms, such as leaking fluid, bleeding, severe headaches, or abdominal cramping.   If you have any questions.  Other tests or screenings that may be performed during your third trimester include:   Blood tests that check for low iron levels (anemia).   Fetal testing to check the health, activity level, and growth of the fetus. Testing is done if you have certain medical conditions or if there are problems during the pregnancy.  FALSE LABOR  You may feel small, irregular contractions that eventually go away. These are called Braxton Hicks contractions, or   false labor. Contractions may last for hours, days, or even weeks before true labor sets in. If contractions come at regular intervals, intensify, or become painful, it is best to be seen by your caregiver.   SIGNS OF LABOR    Menstrual-like cramps.   Contractions that are 5 minutes apart or less.   Contractions that start on the top of the uterus and spread down to the lower abdomen and back.   A sense of increased pelvic pressure or back pain.   A watery or bloody mucus discharge that comes from the vagina.  If you have any of these signs before the 37th week of pregnancy, call your caregiver right away. You need to go to the hospital to get checked immediately.  HOME CARE INSTRUCTIONS    Avoid all  smoking, herbs, alcohol, and unprescribed drugs. These chemicals affect the formation and growth of the baby.   Follow your caregiver's instructions regarding medicine use. There are medicines that are either safe or unsafe to take during pregnancy.   Exercise only as directed by your caregiver. Experiencing uterine cramps is a good sign to stop exercising.   Continue to eat regular, healthy meals.   Wear a good support bra for breast tenderness.   Do not use hot tubs, steam rooms, or saunas.   Wear your seat belt at all times when driving.   Avoid raw meat, uncooked cheese, cat litter boxes, and soil used by cats. These carry germs that can cause birth defects in the baby.   Take your prenatal vitamins.   Try taking a stool softener (if your caregiver approves) if you develop constipation. Eat more high-fiber foods, such as fresh vegetables or fruit and whole grains. Drink plenty of fluids to keep your urine clear or pale yellow.   Take warm sitz baths to soothe any pain or discomfort caused by hemorrhoids. Use hemorrhoid cream if your caregiver approves.   If you develop varicose veins, wear support hose. Elevate your feet for 15 minutes, 3 4 times a day. Limit salt in your diet.   Avoid heavy lifting, wear low heal shoes, and practice good posture.   Rest a lot with your legs elevated if you have leg cramps or low back pain.   Visit your dentist if you have not gone during your pregnancy. Use a soft toothbrush to brush your teeth and be gentle when you floss.   A sexual relationship may be continued unless your caregiver directs you otherwise.   Do not travel far distances unless it is absolutely necessary and only with the approval of your caregiver.   Take prenatal classes to understand, practice, and ask questions about the labor and delivery.   Make a trial run to the hospital.   Pack your hospital bag.   Prepare the baby's nursery.   Continue to go to all your prenatal visits as directed  by your caregiver.  SEEK MEDICAL CARE IF:   You are unsure if you are in labor or if your water has broken.   You have dizziness.   You have mild pelvic cramps, pelvic pressure, or nagging pain in your abdominal area.   You have persistent nausea, vomiting, or diarrhea.   You have a bad smelling vaginal discharge.   You have pain with urination.  SEEK IMMEDIATE MEDICAL CARE IF:    You have a fever.   You are leaking fluid from your vagina.   You have spotting or bleeding from your vagina.     You have severe abdominal cramping or pain.   You have rapid weight loss or gain.   You have shortness of breath with chest pain.   You notice sudden or extreme swelling of your face, hands, ankles, feet, or legs.   You have not felt your baby move in over an hour.   You have severe headaches that do not go away with medicine.   You have vision changes.  Document Released: 05/19/2001 Document Revised: 01/25/2013 Document Reviewed: 07/26/2012  [employer] Patient Information 2014 [employer].

## 2013-08-16 NOTE — Progress Notes (Signed)
+  FM, no lof, no vb, no ctx No other complaints  [name] is a 23 y.o. G1P0 at 4532w5d by L=13 here for ROB visit.  Discussed with Patient:  -Plans to breast/bottle feed.  All questions answered. -Continue prenatal vitamins. -Reviewed fetal kick counts Pt to perform daily at a time when the baby is active, lie laterally with both hands on belly in quiet room and count all movements (hiccups, shoulder rolls, obvious kicks, etc); pt is to report to clinic L&D for less than 10 movements felt in a one hour time period-pt told as soon as she counts 10 movements the count is complete.  - Routine precautions discussed (depression, infection s/s).   Patient provided with all pertinent phone numbers for emergencies. - RTC for any VB, regular, painful cramps/ctxs occurring at a rate of >2/10 min, fever (100.5 or higher), n/v/d, any pain that is unresolving or worsening, LOF, decreased fetal movement, CP, SOB, edema - RTC in 2 weeks for next appt. - gbs next visit  Problems: Patient Active Problem List   Diagnosis Date Noted  . Pregnancy, supervision, normal, first 05/10/2013  . Posterior placenta previa antepartum 03/23/2013    To Do: 1.   [ ]  Vaccines: recd [ ]  BCM: mirena [ ]  Readiness: baby has a place to sleep, car seat, other baby necessities.  Edu: [x ] PTL precautions; [ ]  BF class; [ ]  childbirth class; [ ]   BF counseling;

## 2013-08-30 ENCOUNTER — Ambulatory Visit (INDEPENDENT_AMBULATORY_CARE_PROVIDER_SITE_OTHER): Payer: Medicaid Other | Admitting: Family Medicine

## 2013-08-30 ENCOUNTER — Encounter: Payer: Self-pay | Admitting: Family Medicine

## 2013-08-30 VITALS — BP 132/76 | Temp 98.7°F | Wt 213.0 lb

## 2013-08-30 DIAGNOSIS — Z3400 Encounter for supervision of normal first pregnancy, unspecified trimester: Secondary | ICD-10-CM

## 2013-08-30 LAB — POCT URINALYSIS DIP (DEVICE)
BILIRUBIN URINE: NEGATIVE
Glucose, UA: NEGATIVE mg/dL
Hgb urine dipstick: NEGATIVE
KETONES UR: NEGATIVE mg/dL
Nitrite: NEGATIVE
PH: 6.5 (ref 5.0–8.0)
Protein, ur: NEGATIVE mg/dL
Specific Gravity, Urine: 1.02 (ref 1.005–1.030)
Urobilinogen, UA: 0.2 mg/dL (ref 0.0–1.0)

## 2013-08-30 LAB — OB RESULTS CONSOLE GBS: GBS: POSITIVE

## 2013-08-30 NOTE — Progress Notes (Signed)
P=85 Edema in feet.  C/o of intermittent lower abdominal/pelvic pressure and braxton hicks.

## 2013-08-30 NOTE — Patient Instructions (Signed)
Third Trimester of Pregnancy  The third trimester is from week 29 through week 42, months 7 through 9. The third trimester is a time when the fetus is growing rapidly. At the end of the ninth month, the fetus is about 20 inches in length and weighs 6 10 pounds.   BODY CHANGES  Your body goes through many changes during pregnancy. The changes vary from woman to woman.    Your weight will continue to increase. You can expect to gain 25 35 pounds (11 16 kg) by the end of the pregnancy.   You may begin to get stretch marks on your hips, abdomen, and breasts.   You may urinate more often because the fetus is moving lower into your pelvis and pressing on your bladder.   You may develop or continue to have heartburn as a result of your pregnancy.   You may develop constipation because certain hormones are causing the muscles that push waste through your intestines to slow down.   You may develop hemorrhoids or swollen, bulging veins (varicose veins).   You may have pelvic pain because of the weight gain and pregnancy hormones relaxing your joints between the bones in your pelvis. Back aches may result from over exertion of the muscles supporting your posture.   Your breasts will continue to grow and be tender. A yellow discharge may leak from your breasts called colostrum.   Your belly button may stick out.   You may feel short of breath because of your expanding uterus.   You may notice the fetus "dropping," or moving lower in your abdomen.   You may have a bloody mucus discharge. This usually occurs a few days to a week before labor begins.   Your cervix becomes thin and soft (effaced) near your due date.  WHAT TO EXPECT AT YOUR PRENATAL EXAMS   You will have prenatal exams every 2 weeks until week 36. Then, you will have weekly prenatal exams. During a routine prenatal visit:   You will be weighed to make sure you and the fetus are growing normally.   Your blood pressure is taken.   Your abdomen will be  measured to track your baby's growth.   The fetal heartbeat will be listened to.   Any test results from the previous visit will be discussed.   You may have a cervical check near your due date to see if you have effaced.  At around 36 weeks, your caregiver will check your cervix. At the same time, your caregiver will also perform a test on the secretions of the vaginal tissue. This test is to determine if a type of bacteria, Group B streptococcus, is present. Your caregiver will explain this further.  Your caregiver may ask you:   What your birth plan is.   How you are feeling.   If you are feeling the baby move.   If you have had any abnormal symptoms, such as leaking fluid, bleeding, severe headaches, or abdominal cramping.   If you have any questions.  Other tests or screenings that may be performed during your third trimester include:   Blood tests that check for low iron levels (anemia).   Fetal testing to check the health, activity level, and growth of the fetus. Testing is done if you have certain medical conditions or if there are problems during the pregnancy.  FALSE LABOR  You may feel small, irregular contractions that eventually go away. These are called Braxton Hicks contractions, or   false labor. Contractions may last for hours, days, or even weeks before true labor sets in. If contractions come at regular intervals, intensify, or become painful, it is best to be seen by your caregiver.   SIGNS OF LABOR    Menstrual-like cramps.   Contractions that are 5 minutes apart or less.   Contractions that start on the top of the uterus and spread down to the lower abdomen and back.   A sense of increased pelvic pressure or back pain.   A watery or bloody mucus discharge that comes from the vagina.  If you have any of these signs before the 37th week of pregnancy, call your caregiver right away. You need to go to the hospital to get checked immediately.  HOME CARE INSTRUCTIONS    Avoid all  smoking, herbs, alcohol, and unprescribed drugs. These chemicals affect the formation and growth of the baby.   Follow your caregiver's instructions regarding medicine use. There are medicines that are either safe or unsafe to take during pregnancy.   Exercise only as directed by your caregiver. Experiencing uterine cramps is a good sign to stop exercising.   Continue to eat regular, healthy meals.   Wear a good support bra for breast tenderness.   Do not use hot tubs, steam rooms, or saunas.   Wear your seat belt at all times when driving.   Avoid raw meat, uncooked cheese, cat litter boxes, and soil used by cats. These carry germs that can cause birth defects in the baby.   Take your prenatal vitamins.   Try taking a stool softener (if your caregiver approves) if you develop constipation. Eat more high-fiber foods, such as fresh vegetables or fruit and whole grains. Drink plenty of fluids to keep your urine clear or pale yellow.   Take warm sitz baths to soothe any pain or discomfort caused by hemorrhoids. Use hemorrhoid cream if your caregiver approves.   If you develop varicose veins, wear support hose. Elevate your feet for 15 minutes, 3 4 times a day. Limit salt in your diet.   Avoid heavy lifting, wear low heal shoes, and practice good posture.   Rest a lot with your legs elevated if you have leg cramps or low back pain.   Visit your dentist if you have not gone during your pregnancy. Use a soft toothbrush to brush your teeth and be gentle when you floss.   A sexual relationship may be continued unless your caregiver directs you otherwise.   Do not travel far distances unless it is absolutely necessary and only with the approval of your caregiver.   Take prenatal classes to understand, practice, and ask questions about the labor and delivery.   Make a trial run to the hospital.   Pack your hospital bag.   Prepare the baby's nursery.   Continue to go to all your prenatal visits as directed  by your caregiver.  SEEK MEDICAL CARE IF:   You are unsure if you are in labor or if your water has broken.   You have dizziness.   You have mild pelvic cramps, pelvic pressure, or nagging pain in your abdominal area.   You have persistent nausea, vomiting, or diarrhea.   You have a bad smelling vaginal discharge.   You have pain with urination.  SEEK IMMEDIATE MEDICAL CARE IF:    You have a fever.   You are leaking fluid from your vagina.   You have spotting or bleeding from your vagina.     You have severe abdominal cramping or pain.   You have rapid weight loss or gain.   You have shortness of breath with chest pain.   You notice sudden or extreme swelling of your face, hands, ankles, feet, or legs.   You have not felt your baby move in over an hour.   You have severe headaches that do not go away with medicine.   You have vision changes.  Document Released: 05/19/2001 Document Revised: 01/25/2013 Document Reviewed: 07/26/2012  [employer] Patient Information 2014 [employer].

## 2013-08-30 NOTE — Progress Notes (Signed)
S: 23 yo G1 @ 1584w5d here for ROBV - doing well. Occasional ctx - no vb, lof. +FM  O: see flowsheet  A/P - doing well  - labor precautions disucssed - 36 week cx done today  - f/u in 1 week.

## 2013-08-31 LAB — OB RESULTS CONSOLE GC/CHLAMYDIA
CHLAMYDIA, DNA PROBE: NEGATIVE
Gonorrhea: NEGATIVE

## 2013-08-31 LAB — GC/CHLAMYDIA PROBE AMP
CT PROBE, AMP APTIMA: NEGATIVE
GC Probe RNA: NEGATIVE

## 2013-09-01 ENCOUNTER — Encounter: Payer: Self-pay | Admitting: Family Medicine

## 2013-09-03 LAB — CULTURE, STREPTOCOCCUS GRP B W/SUSCEPT

## 2013-09-06 ENCOUNTER — Ambulatory Visit (INDEPENDENT_AMBULATORY_CARE_PROVIDER_SITE_OTHER): Payer: Medicaid Other | Admitting: Advanced Practice Midwife

## 2013-09-06 VITALS — BP 136/76 | Temp 99.0°F | Wt 216.1 lb

## 2013-09-06 DIAGNOSIS — Z3400 Encounter for supervision of normal first pregnancy, unspecified trimester: Secondary | ICD-10-CM

## 2013-09-06 DIAGNOSIS — O4410 Placenta previa with hemorrhage, unspecified trimester: Secondary | ICD-10-CM

## 2013-09-06 DIAGNOSIS — O4400 Placenta previa specified as without hemorrhage, unspecified trimester: Secondary | ICD-10-CM

## 2013-09-06 LAB — POCT URINALYSIS DIP (DEVICE)
Bilirubin Urine: NEGATIVE
Glucose, UA: NEGATIVE mg/dL
HGB URINE DIPSTICK: NEGATIVE
Ketones, ur: NEGATIVE mg/dL
Nitrite: NEGATIVE
Protein, ur: NEGATIVE mg/dL
Specific Gravity, Urine: 1.02 (ref 1.005–1.030)
UROBILINOGEN UA: 0.2 mg/dL (ref 0.0–1.0)
pH: 6.5 (ref 5.0–8.0)

## 2013-09-06 NOTE — Progress Notes (Signed)
I have seen the patient with the resident/student and agree with the above.  Milani Lowenstein Donovan  

## 2013-09-06 NOTE — Progress Notes (Signed)
Ms. Hannah Burns is a 23 year old female G1P0, GA [redacted]wks5d presenting for routine follow up. She has no complaints at this visit. She denies any vaginal leakage, discharge or bleeding. She reports having contractions 1-2 per hour. Patient denied cervical check.

## 2013-09-06 NOTE — Progress Notes (Signed)
P= 82 C/o of lower abdominal/pelvic pressure and irregular contractions.  Edema in feet.

## 2013-09-11 ENCOUNTER — Encounter: Payer: Self-pay | Admitting: Family Medicine

## 2013-09-12 ENCOUNTER — Encounter: Payer: Self-pay | Admitting: Family Medicine

## 2013-09-12 ENCOUNTER — Ambulatory Visit (INDEPENDENT_AMBULATORY_CARE_PROVIDER_SITE_OTHER): Payer: Medicaid Other | Admitting: Family Medicine

## 2013-09-12 VITALS — BP 128/79 | Temp 97.4°F | Wt 211.1 lb

## 2013-09-12 DIAGNOSIS — O4410 Placenta previa with hemorrhage, unspecified trimester: Secondary | ICD-10-CM

## 2013-09-12 DIAGNOSIS — O4400 Placenta previa specified as without hemorrhage, unspecified trimester: Secondary | ICD-10-CM

## 2013-09-12 DIAGNOSIS — Z3400 Encounter for supervision of normal first pregnancy, unspecified trimester: Secondary | ICD-10-CM

## 2013-09-12 LAB — POCT URINALYSIS DIP (DEVICE)
Bilirubin Urine: NEGATIVE
Glucose, UA: NEGATIVE mg/dL
HGB URINE DIPSTICK: NEGATIVE
KETONES UR: NEGATIVE mg/dL
Leukocytes, UA: NEGATIVE
NITRITE: NEGATIVE
PH: 7 (ref 5.0–8.0)
Protein, ur: NEGATIVE mg/dL
Specific Gravity, Urine: 1.025 (ref 1.005–1.030)
Urobilinogen, UA: 0.2 mg/dL (ref 0.0–1.0)

## 2013-09-12 NOTE — Progress Notes (Signed)
+  FM no lof no vb, occ ctx - 3-4/day  Membranes strip  [name] is a 23 y.o. G1P0 at 9557w4d by L=13 here for ROB visit.    Discussed with Patient:  - Plans to breast/bottle feed.  All questions answered. - Continue prenatal vitamins. - Reviewed fetal kick counts Pt to perform daily at a time when the baby is active, lie laterally with both hands on belly in quiet room and count all movements (hiccups, shoulder rolls, obvious kicks, etc); pt is to report to clinic MAU for less than 10 movements felt in a 2 hour time period-pt told as soon as she counts 10 movements the count is complete.  - Routine precautions discussed (depression, infection s/s).   Patient provided with all pertinent phone numbers for emergencies. - RTC for any VB, regular, painful cramps/ctxs occurring at a rate of >2/10 min, fever (100.5 or higher), n/v/d, any pain that is unresolving or worsening, LOF, decreased fetal movement, CP, SOB, edema -RTC in one week for next visit.  Problems: Patient Active Problem List   Diagnosis Date Noted  . Pregnancy, supervision, normal, first 05/10/2013  . Posterior placenta previa antepartum 03/23/2013    To Do: 1.   [ ]  Vaccines: recd [ ]  BCM: mirena [ ]  Readiness: baby has a place to sleep, car seat, other baby necessities.  Edu: [x ] TL precautions; [ ]  BF class; [ ]  childbirth class; [ ]   BF counseling;

## 2013-09-12 NOTE — Progress Notes (Signed)
P= 74 Edema in feet.  Lower abdominal/vaginal pressure and irregular contractions.

## 2013-09-12 NOTE — Patient Instructions (Signed)
Third Trimester of Pregnancy  The third trimester is from week 29 through week 42, months 7 through 9. The third trimester is a time when the fetus is growing rapidly. At the end of the ninth month, the fetus is about 20 inches in length and weighs 6 10 pounds.   BODY CHANGES  Your body goes through many changes during pregnancy. The changes vary from woman to woman.    Your weight will continue to increase. You can expect to gain 25 35 pounds (11 16 kg) by the end of the pregnancy.   You may begin to get stretch marks on your hips, abdomen, and breasts.   You may urinate more often because the fetus is moving lower into your pelvis and pressing on your bladder.   You may develop or continue to have heartburn as a result of your pregnancy.   You may develop constipation because certain hormones are causing the muscles that push waste through your intestines to slow down.   You may develop hemorrhoids or swollen, bulging veins (varicose veins).   You may have pelvic pain because of the weight gain and pregnancy hormones relaxing your joints between the bones in your pelvis. Back aches may result from over exertion of the muscles supporting your posture.   Your breasts will continue to grow and be tender. A yellow discharge may leak from your breasts called colostrum.   Your belly button may stick out.   You may feel short of breath because of your expanding uterus.   You may notice the fetus "dropping," or moving lower in your abdomen.   You may have a bloody mucus discharge. This usually occurs a few days to a week before labor begins.   Your cervix becomes thin and soft (effaced) near your due date.  WHAT TO EXPECT AT YOUR PRENATAL EXAMS   You will have prenatal exams every 2 weeks until week 36. Then, you will have weekly prenatal exams. During a routine prenatal visit:   You will be weighed to make sure you and the fetus are growing normally.   Your blood pressure is taken.   Your abdomen will be  measured to track your baby's growth.   The fetal heartbeat will be listened to.   Any test results from the previous visit will be discussed.   You may have a cervical check near your due date to see if you have effaced.  At around 36 weeks, your caregiver will check your cervix. At the same time, your caregiver will also perform a test on the secretions of the vaginal tissue. This test is to determine if a type of bacteria, Group B streptococcus, is present. Your caregiver will explain this further.  Your caregiver may ask you:   What your birth plan is.   How you are feeling.   If you are feeling the baby move.   If you have had any abnormal symptoms, such as leaking fluid, bleeding, severe headaches, or abdominal cramping.   If you have any questions.  Other tests or screenings that may be performed during your third trimester include:   Blood tests that check for low iron levels (anemia).   Fetal testing to check the health, activity level, and growth of the fetus. Testing is done if you have certain medical conditions or if there are problems during the pregnancy.  FALSE LABOR  You may feel small, irregular contractions that eventually go away. These are called Braxton Hicks contractions, or   false labor. Contractions may last for hours, days, or even weeks before true labor sets in. If contractions come at regular intervals, intensify, or become painful, it is best to be seen by your caregiver.   SIGNS OF LABOR    Menstrual-like cramps.   Contractions that are 5 minutes apart or less.   Contractions that start on the top of the uterus and spread down to the lower abdomen and back.   A sense of increased pelvic pressure or back pain.   A watery or bloody mucus discharge that comes from the vagina.  If you have any of these signs before the 37th week of pregnancy, call your caregiver right away. You need to go to the hospital to get checked immediately.  HOME CARE INSTRUCTIONS    Avoid all  smoking, herbs, alcohol, and unprescribed drugs. These chemicals affect the formation and growth of the baby.   Follow your caregiver's instructions regarding medicine use. There are medicines that are either safe or unsafe to take during pregnancy.   Exercise only as directed by your caregiver. Experiencing uterine cramps is a good sign to stop exercising.   Continue to eat regular, healthy meals.   Wear a good support bra for breast tenderness.   Do not use hot tubs, steam rooms, or saunas.   Wear your seat belt at all times when driving.   Avoid raw meat, uncooked cheese, cat litter boxes, and soil used by cats. These carry germs that can cause birth defects in the baby.   Take your prenatal vitamins.   Try taking a stool softener (if your caregiver approves) if you develop constipation. Eat more high-fiber foods, such as fresh vegetables or fruit and whole grains. Drink plenty of fluids to keep your urine clear or pale yellow.   Take warm sitz baths to soothe any pain or discomfort caused by hemorrhoids. Use hemorrhoid cream if your caregiver approves.   If you develop varicose veins, wear support hose. Elevate your feet for 15 minutes, 3 4 times a day. Limit salt in your diet.   Avoid heavy lifting, wear low heal shoes, and practice good posture.   Rest a lot with your legs elevated if you have leg cramps or low back pain.   Visit your dentist if you have not gone during your pregnancy. Use a soft toothbrush to brush your teeth and be gentle when you floss.   A sexual relationship may be continued unless your caregiver directs you otherwise.   Do not travel far distances unless it is absolutely necessary and only with the approval of your caregiver.   Take prenatal classes to understand, practice, and ask questions about the labor and delivery.   Make a trial run to the hospital.   Pack your hospital bag.   Prepare the baby's nursery.   Continue to go to all your prenatal visits as directed  by your caregiver.  SEEK MEDICAL CARE IF:   You are unsure if you are in labor or if your water has broken.   You have dizziness.   You have mild pelvic cramps, pelvic pressure, or nagging pain in your abdominal area.   You have persistent nausea, vomiting, or diarrhea.   You have a bad smelling vaginal discharge.   You have pain with urination.  SEEK IMMEDIATE MEDICAL CARE IF:    You have a fever.   You are leaking fluid from your vagina.   You have spotting or bleeding from your vagina.     You have severe abdominal cramping or pain.   You have rapid weight loss or gain.   You have shortness of breath with chest pain.   You notice sudden or extreme swelling of your face, hands, ankles, feet, or legs.   You have not felt your baby move in over an hour.   You have severe headaches that do not go away with medicine.   You have vision changes.  Document Released: 05/19/2001 Document Revised: 01/25/2013 Document Reviewed: 07/26/2012  [employer] Patient Information 2014 [employer].

## 2013-09-14 ENCOUNTER — Encounter (HOSPITAL_COMMUNITY): Payer: Self-pay | Admitting: *Deleted

## 2013-09-14 ENCOUNTER — Encounter (HOSPITAL_COMMUNITY): Payer: Medicaid Other | Admitting: Anesthesiology

## 2013-09-14 ENCOUNTER — Inpatient Hospital Stay (HOSPITAL_COMMUNITY): Payer: Medicaid Other | Admitting: Anesthesiology

## 2013-09-14 ENCOUNTER — Inpatient Hospital Stay (HOSPITAL_COMMUNITY)
Admission: AD | Admit: 2013-09-14 | Discharge: 2013-09-16 | DRG: 775 | Disposition: A | Discharge status: Home / Self Care | Payer: Medicaid Other | Source: Ambulatory Visit | Attending: Family Medicine | Admitting: Family Medicine

## 2013-09-14 DIAGNOSIS — O9989 Other specified diseases and conditions complicating pregnancy, childbirth and the puerperium: Secondary | ICD-10-CM

## 2013-09-14 DIAGNOSIS — Z22330 Carrier of Group B streptococcus: Secondary | ICD-10-CM

## 2013-09-14 DIAGNOSIS — Z87891 Personal history of nicotine dependence: Secondary | ICD-10-CM

## 2013-09-14 DIAGNOSIS — Z3400 Encounter for supervision of normal first pregnancy, unspecified trimester: Secondary | ICD-10-CM

## 2013-09-14 DIAGNOSIS — O99892 Other specified diseases and conditions complicating childbirth: Secondary | ICD-10-CM

## 2013-09-14 DIAGNOSIS — IMO0001 Reserved for inherently not codable concepts without codable children: Secondary | ICD-10-CM

## 2013-09-14 DIAGNOSIS — O4400 Placenta previa specified as without hemorrhage, unspecified trimester: Secondary | ICD-10-CM

## 2013-09-14 LAB — TYPE AND SCREEN
ABO/RH(D): B POS
Antibody Screen: NEGATIVE

## 2013-09-14 LAB — CBC
HCT: 37.4 % (ref 36.0–46.0)
Hemoglobin: 13.3 g/dL (ref 12.0–15.0)
MCH: 31.9 pg (ref 26.0–34.0)
MCHC: 35.6 g/dL (ref 30.0–36.0)
MCV: 89.7 fL (ref 78.0–100.0)
Platelets: 179 10*3/uL (ref 150–400)
RBC: 4.17 MIL/uL (ref 3.87–5.11)
RDW: 13 % (ref 11.5–15.5)
WBC: 19 10*3/uL — ABNORMAL HIGH (ref 4.0–10.5)

## 2013-09-14 LAB — ABO/RH: ABO/RH(D): B POS

## 2013-09-14 MED ORDER — ONDANSETRON HCL 4 MG/2ML IJ SOLN: 4.0000 mg | Freq: Four times a day (QID) | INTRAMUSCULAR | Status: DC | PRN

## 2013-09-14 MED ORDER — OXYTOCIN 40 UNITS IN LACTATED RINGERS INFUSION - SIMPLE MED
62.5000 mL/h | INTRAVENOUS | Status: DC
  Administered 2013-09-14: 62.5 mL/h via INTRAVENOUS
  Filled 2013-09-14: qty 1000

## 2013-09-14 MED ORDER — LIDOCAINE HCL (PF) 1 % IJ SOLN
INTRAMUSCULAR | Status: AC
  Filled 2013-09-14: qty 30

## 2013-09-14 MED ORDER — DIPHENHYDRAMINE HCL 50 MG/ML IJ SOLN: 12.5000 mg | INTRAMUSCULAR | Status: DC | PRN

## 2013-09-14 MED ORDER — PENICILLIN G POTASSIUM 5000000 UNITS IJ SOLR
2.5000 10*6.[IU] | INTRAMUSCULAR | Status: DC
  Administered 2013-09-14: 2.5 10*6.[IU] via INTRAVENOUS
  Filled 2013-09-14 (×5): qty 2.5

## 2013-09-14 MED ORDER — FENTANYL 2.5 MCG/ML BUPIVACAINE 1/10 % EPIDURAL INFUSION (WH - ANES)
14.0000 mL/h | INTRAMUSCULAR | Status: DC | PRN
  Administered 2013-09-14: 14 mL/h via EPIDURAL
  Filled 2013-09-14: qty 125

## 2013-09-14 MED ORDER — LACTATED RINGERS IV SOLN: 500.0000 mL | Freq: Once | INTRAVENOUS | Status: DC

## 2013-09-14 MED ORDER — CITRIC ACID-SODIUM CITRATE 334-500 MG/5ML PO SOLN: 30.0000 mL | ORAL | Status: DC | PRN

## 2013-09-14 MED ORDER — LIDOCAINE HCL (PF) 1 % IJ SOLN
30.0000 mL | INTRAMUSCULAR | Status: AC | PRN
  Administered 2013-09-14: 3 mL via SUBCUTANEOUS
  Administered 2013-09-14: 7 mL via SUBCUTANEOUS

## 2013-09-14 MED ORDER — OXYTOCIN BOLUS FROM INFUSION: 500.0000 mL | INTRAVENOUS | Status: DC

## 2013-09-14 MED ORDER — PHENYLEPHRINE 40 MCG/ML (10ML) SYRINGE FOR IV PUSH (FOR BLOOD PRESSURE SUPPORT)
80.0000 ug | PREFILLED_SYRINGE | INTRAVENOUS | Status: DC | PRN
  Filled 2013-09-14: qty 2

## 2013-09-14 MED ORDER — OXYCODONE-ACETAMINOPHEN 5-325 MG PO TABS: 1.0000 | ORAL_TABLET | ORAL | Status: DC | PRN

## 2013-09-14 MED ORDER — EPHEDRINE 5 MG/ML INJ
10.0000 mg | INTRAVENOUS | Status: DC | PRN
  Filled 2013-09-14: qty 2

## 2013-09-14 MED ORDER — IBUPROFEN 600 MG PO TABS: 600.0000 mg | ORAL_TABLET | Freq: Four times a day (QID) | ORAL | Status: DC | PRN

## 2013-09-14 MED ORDER — ACETAMINOPHEN 325 MG PO TABS: 650.0000 mg | ORAL_TABLET | ORAL | Status: DC | PRN

## 2013-09-14 MED ORDER — DEXTROSE 5 % IV SOLN
5.0000 10*6.[IU] | Freq: Once | INTRAVENOUS | Status: AC
  Administered 2013-09-14: 5 10*6.[IU] via INTRAVENOUS
  Filled 2013-09-14: qty 5

## 2013-09-14 MED ORDER — PHENYLEPHRINE 40 MCG/ML (10ML) SYRINGE FOR IV PUSH (FOR BLOOD PRESSURE SUPPORT)
80.0000 ug | PREFILLED_SYRINGE | INTRAVENOUS | Status: DC | PRN
  Filled 2013-09-14: qty 2
  Filled 2013-09-14: qty 10

## 2013-09-14 MED ORDER — EPHEDRINE 5 MG/ML INJ
10.0000 mg | INTRAVENOUS | Status: DC | PRN
  Filled 2013-09-14: qty 4
  Filled 2013-09-14: qty 2

## 2013-09-14 MED ORDER — LACTATED RINGERS IV SOLN
500.0000 mL | INTRAVENOUS | Status: DC | PRN
  Administered 2013-09-14: 1000 mL via INTRAVENOUS

## 2013-09-14 MED ORDER — LACTATED RINGERS IV SOLN: INTRAVENOUS | Status: DC

## 2013-09-14 NOTE — H&P (Signed)
Hannah Burns is a 23 y.o. female G1P0 with IUP at [redacted]w[redacted]d presented to MAU for labor check. Pt states she has been having regular, every 2-3 min contractions. Denies any LOF, suspect membranes intact. Denies any vaginal bleeding or discharge. Admits good fetal movement.    Prenatal History/Complications: [facility]are at [facility] 16 wks. Review of records showed posterior placenta previa (on [redacted]w[redacted]d [place]) resolved on repeat US (20w). Otherwise, no complications.  Past Medical History: History reviewed. No pertinent past medical history.  Past Surgical History: History reviewed. No pertinent past surgical history.  Obstetrical History: OB History   Grav Para Term Preterm Abortions TAB SAB Ect Mult Living   1 0             Social History: History   Social History  . Marital Status: Single    Spouse Name: N/A    Number of Children: N/A  . Years of Education: N/A   Social History Main Topics  . Smoking status: Former Smoker -- 0.25 packs/day    Types: Cigarettes    Quit date: 07/19/2013  . Smokeless tobacco: None  . Alcohol Use: No  . Drug Use: No  . Sexual Activity: Yes    Birth Control/ Protection: None   Other Topics Concern  . None   Social History Narrative  . None    Family History: Family History  Problem Relation Age of Onset  . Diabetes Maternal Grandmother     Allergies: No Known Allergies  Facility-administered medications prior to admission  Medication Dose Route Frequency Provider Last Rate Last Dose  . Tdap (BOOSTRIX) injection 0.5 mL  0.5 mL Intramuscular Once Minta Balsam, MD       Prescriptions prior to admission  Medication Sig Dispense Refill  . Prenatal Vit-Fe Fumarate-FA (PRENATAL MULTIVITAMIN) TABS tablet Take 1 tablet by mouth daily at 12 noon.  90 tablet  3    Review of Systems: Negative unless otherwise stated in History above  Physicial Blood pressure 125/58, pulse 68, temperature 98 F (36.7 C), temperature source Oral, resp. rate 18,  height 5\' 1"  (1.549 m), weight 95.255 kg (210 lb), SpO2 100.00%. General appearance: alert and cooperative, comfortable s/p epidural Lungs: clear to auscultation bilaterally Heart: regular rate and rhythm Abdomen: gravid appropriate for GA, soft, non-tender  Extremities: Homans sign is negative, no sign of DVT DTR's +2 Presentation: cephalic Fetal monitoring Baseline: 140 bpm, moderate variability, no accels, occasional variable decels Uterine activity - regular q 2-4 min Dilation: 4 Effacement (%): 100 Station: -1 Exam by:: LLeftwich Craige Cotta CNM  Prenatal labs: ABO, Rh: --/--/B POS (04/09 1710) Antibody: NEG (04/09 1710) Rubella:  immune RPR: NON REAC (01/28 0856)  HBsAg: NEGATIVE (11/05 0946)  HIV: NON REACTIVE (01/28 0856)  GBS: Positive (03/25 0000)  GTT: 3rd tri 123 - normal  Prenatal Transfer Tool  Maternal Diabetes: No Genetic Screening: Normal Maternal Ultrasounds/Referrals: Abnormal:  Findings:   Other: Fetal Ultrasounds or other Referrals:  None Maternal Substance Abuse:  No Significant Maternal Medications:  None Significant Maternal Lab Results: Lab values include: Group B Strep positive  Results for orders placed during the hospital encounter of 09/14/13 (from the past 24 hour(s))  CBC   Collection Time    09/14/13  5:10 PM      Result Value Ref Range   WBC 19.0 (*) 4.0 - 10.5 K/uL   RBC 4.17  3.87 - 5.11 MIL/uL   Hemoglobin 13.3  12.0 - 15.0 g/dL   HCT 37.4  36.0 - 46.0 %   MCV 89.7  78.0 - 100.0 fL   MCH 31.9  26.0 - 34.0 pg   MCHC 35.6  30.0 - 36.0 g/dL   RDW 16.113.0  09.611.5 - 04.515.5 %   Platelets 179  150 - 400 K/uL  TYPE AND SCREEN   Collection Time    09/14/13  5:10 PM      Result Value Ref Range   ABO/RH(D) B POS     Antibody Screen NEG     Sample Expiration 09/17/2013      Assessment: [name] is a 23 y.o. G1P0 at 3636w6d presented for SOL without ROM. Progressed well without augmentation. Significant prolonged decels with intermittent  bradycardia to 60-70, resolved immediately following AROM (@ 1818), placement of IUPC (1821).  #Labor: Expectant management. Cervix continues to progress. S/p  #Pain: Epidural #FWB: CAT II - significant prolonged decels, improved s/p AROM #ID: GBS (positive) - PCN prophylaxis #Feeding: Breast / Bottle #MOC: Mirena IUD  Saralyn PilarAlexander Karamalegos, DO [facility], PGY-1 09/14/2013, 7:29 PM   I have seen this patient and agree with the above resident's note.  LEFTWICH-KIRBY, Marlow Hendrie Certified Nurse-Midwife

## 2013-09-14 NOTE — H&P (Signed)
Attestation of Attending Supervision of Advanced Practitioner (PA/CNM/NP): Evaluation and management procedures were performed by the Advanced Practitioner under my supervision and collaboration.  I have reviewed the Advanced Practitioner's note and chart, and I agree with the management and plan.  Tywaun Hiltner S Jaylina Ramdass, MD [facility] Faculty Practice Attending 09/14/2013 9:30 PM   

## 2013-09-14 NOTE — Progress Notes (Signed)
[name] is a 23 y.o. G1P0 at 7549w6d admitted for spontaneous onset of labor.  Subjective: Patient is doing well, 6-7/100/0. Baby had a prolonged decel lasting 6 minutes on cervical check. Responded well to nursing intervention.  Objective: BP 150/60  Pulse 75  Temp(Src) 98 F (36.7 C) (Oral)  Resp 18  Ht 5\' 1"  (1.549 m)  Wt 95.255 kg (210 lb)  BMI 39.70 kg/m2  SpO2 100%      FHT:  FHR: 150 bpm, variability: moderate,  accelerations:  Present,  decelerations:  Present 1 decel lasting 6 minutes on cervical check UC:   regular, every 2-3 minutes SVE:   Dilation: 4 Effacement (%): 100 Station: -1 Exam by:: Hannah Burns  Labs: Lab Results  Component Value Date   WBC 19.0* 09/14/2013   HGB 13.3 09/14/2013   HCT 37.4 09/14/2013   MCV 89.7 09/14/2013   PLT 179 09/14/2013    Assessment / Plan: Spontaneous labor, progressing normally  Labor: Progressing normally Preeclampsia:  no signs or symptoms of toxicity Fetal Wellbeing:  Category I Pain Control:  Epidural I/D:  n/a Anticipated MOD:  NSVD  Hannah Burns 09/14/2013, 9:15 PM  I have seen and examined this patient and agree the above assessment. Hannah Burns 09/14/2013 10:42 PM

## 2013-09-14 NOTE — Progress Notes (Signed)
Hannah Burns is a 23 y.o. G1P0 at 7372w6d by ultrasound admitted for active labor without ROM.  Subjective: Called to patient's room due to [facility] prolonged decels and maternal elevated BP SBP >200 (about 30 min - 1 hour after epidural placement). Nursing proceeded with position change to left side, O2 mask placed.  On arrival, reviewed FHT strip, patient shaking but comfortable, remains on O2 mask.  Objective: BP 131/54  Pulse 68  Temp(Src) 97.3 F (36.3 C) (Oral)  Resp 18  Ht 5\' 1"  (1.549 m)  Wt 95.255 kg (210 lb)  BMI 39.70 kg/m2  SpO2 100%      FHT:  Intermittent bradycardia with prolonged decels down to 60-70s with return to baseline of 120s, moderate variability, no accels.  UC:   No regular contractions traced, palpation q 2-4 min SVE:   Dilation: 4 Effacement (%): 100 Station: -1 Exam by:: LLeftwich Kirby CNM  Performed AROM @ 1818 (clear fluid). S/p AROM repeat review of FHT: FHT: quick return to b/l 150 bpm, moderate variability, no accels, intermittent variable decels with contractions  Labs: Lab Results  Component Value Date   WBC 19.0* 09/14/2013   HGB 13.3 09/14/2013   HCT 37.4 09/14/2013   MCV 89.7 09/14/2013   PLT 179 09/14/2013    Assessment / Plan: Hannah Burns is a 23 y.o. G1P0 at 1172w6d by US Spontaneous labor, progressing normally  Labor: Expectant management. Progressing normally. Preeclampsia:  n/a Fetal Wellbeing:  Category II Pain Control:  Epidural I/D:  GBS positive, on PCN prophylaxis Anticipated MOD:  NSVD  Saralyn PilarAlexander Ashish Rossetti, DO [facility], PGY-1 09/14/2013, 6:50 PM

## 2013-09-14 NOTE — MAU Provider Note (Signed)
Chief Complaint:  No chief complaint on file.   None     HPI: Hannah Burns is a 23 y.o. G1P0 at 9650w6d who presents for labor check. Denies leakage of fluid or vaginal bleeding. Good fetal movement.   Pregnancy Course: LRC, previa resolved  Past Medical History: History reviewed. No pertinent past medical history.  Past obstetric history: OB History  Gravida Para Term Preterm AB SAB TAB Ectopic Multiple Living  1 0            # Outcome Date GA Lbr Len/2nd Weight Sex Delivery Anes PTL Lv  1 CUR               Past Surgical History: History reviewed. No pertinent past surgical history.   Family History: Family History  Problem Relation Age of Onset  . Diabetes Maternal Grandmother     Social History: History  Substance Use Topics  . Smoking status: Former Smoker -- 0.25 packs/day    Types: Cigarettes    Quit date: 07/19/2013  . Smokeless tobacco: Not on file  . Alcohol Use: No    Allergies: No Known Allergies  Meds:  Facility-administered medications prior to admission  Medication Dose Route Frequency Provider Last Rate Last Dose  . Tdap (BOOSTRIX) injection 0.5 mL  0.5 mL Intramuscular Once Minta BalsamMichael R Odom, MD       Prescriptions prior to admission  Medication Sig Dispense Refill  . Prenatal Vit-Fe Fumarate-FA (PRENATAL MULTIVITAMIN) TABS tablet Take 1 tablet by mouth daily at 12 noon.  90 tablet  3    ROS: Pertinent findings in history of present illness.  Physical Exam  Blood pressure 125/81, pulse 62, temperature 97.3 F (36.3 C), temperature source Oral, resp. rate 18, height 5\' 1"  (1.549 m), weight 95.255 kg (210 lb). GENERAL: Well-developed, well-nourished female in no acute distress.  HEENT: normocephalic HEART: normal rate RESP: normal effort ABDOMEN: Soft, non-tender, gravid appropriate for gestational age EXTREMITIES: Nontender, no edema NEURO: alert and oriented  Dilation: 3.5 Effacement (%): 100 Cervical Position: Middle Station:  -1 Presentation: Vertex Exam by:: Mearl Olver,cnm  FHT:  Baseline 125-130 , moderate variability, accelerations present, no decelerations Contractions: q 2-3 mins   Labs: No results found for this or any previous visit (from the past 24 hour(s)).  Imaging:  No results found.  MAU Course: Initiial RN exam 2/100/-2  Assessment: G1 @[redacted]w[redacted]d , early labor Cat 1 FHR GBS carriage  Plan: Admit. Epidural. IV PCN.  Danae Orleanseirdre C Fenris Cauble, CNM 09/14/2013 4:53 PM

## 2013-09-14 NOTE — Anesthesia Procedure Notes (Signed)
Epidural Patient location during procedure: OB Start time: 09/14/2013 5:44 PM End time: 09/14/2013 5:59 PM  Staffing Anesthesiologist: Islay Polanco, CHRIS Performed by: anesthesiologist   Preanesthetic Checklist Completed: patient identified, surgical consent, pre-op evaluation, timeout performed, IV checked, risks and benefits discussed and monitors and equipment checked  Epidural Patient position: sitting Prep: site prepped and draped and DuraPrep Patient monitoring: heart rate, cardiac monitor, continuous pulse ox and blood pressure Approach: midline Location: L3-L4 Injection technique: LOR saline  Needle:  Needle type: Tuohy  Needle gauge: 17 G Needle length: 9 cm Needle insertion depth: 9 cm Catheter type: closed end flexible Catheter size: 19 Gauge Catheter at skin depth: 15 cm Test dose: Other  Assessment Events: blood not aspirated, injection not painful, no injection resistance, negative IV test and no paresthesia  Additional Notes H+P and labs checked, risks and benefits discussed with the patient, consent obtained, procedure tolerated well and without complications.  Reason for block:procedure for pain

## 2013-09-14 NOTE — Anesthesia Preprocedure Evaluation (Addendum)
Anesthesia Evaluation  Patient identified by MRN, date of birth, ID band Patient awake    Reviewed: Allergy & Precautions, H&P , NPO status , Patient's Chart, lab work & pertinent test results  History of Anesthesia Complications Negative for: history of anesthetic complications  Airway Mallampati: II TM Distance: >3 FB Neck ROM: Full    Dental  (+) Teeth Intact   Pulmonary neg shortness of breath, neg sleep apnea, neg COPDneg recent URI, former smoker,  breath sounds clear to auscultation        Cardiovascular negative cardio ROS  Rhythm:Regular     Neuro/Psych negative neurological ROS  negative psych ROS   GI/Hepatic negative GI ROS, Neg liver ROS,   Endo/Other  negative endocrine ROS  Renal/GU negative Renal ROS     Musculoskeletal   Abdominal   Peds  Hematology negative hematology ROS (+)   Anesthesia Other Findings   Reproductive/Obstetrics (+) Pregnancy                          Anesthesia Physical Anesthesia Plan  ASA: II  Anesthesia Plan: Epidural   Post-op Pain Management:    Induction:   Airway Management Planned:   Additional Equipment: None  Intra-op Plan:   Post-operative Plan:   Informed Consent: I have reviewed the patients History and Physical, chart, labs and discussed the procedure including the risks, benefits and alternatives for the proposed anesthesia with the patient or authorized representative who has indicated his/her understanding and acceptance.   Dental advisory given  Plan Discussed with: Anesthesiologist  Anesthesia Plan Comments:         Anesthesia Quick Evaluation

## 2013-09-15 ENCOUNTER — Encounter (HOSPITAL_COMMUNITY): Payer: Self-pay | Admitting: *Deleted

## 2013-09-15 LAB — RPR

## 2013-09-15 MED ORDER — ONDANSETRON HCL 4 MG/2ML IJ SOLN: 4.0000 mg | INTRAMUSCULAR | Status: DC | PRN

## 2013-09-15 MED ORDER — ONDANSETRON HCL 4 MG PO TABS: 4.0000 mg | ORAL_TABLET | ORAL | Status: DC | PRN

## 2013-09-15 MED ORDER — PNEUMOCOCCAL VAC POLYVALENT 25 MCG/0.5ML IJ INJ
0.5000 mL | INJECTION | INTRAMUSCULAR | Status: DC
  Filled 2013-09-15: qty 0.5

## 2013-09-15 MED ORDER — ZOLPIDEM TARTRATE 5 MG PO TABS: 5.0000 mg | ORAL_TABLET | Freq: Every evening | ORAL | Status: DC | PRN

## 2013-09-15 MED ORDER — WITCH HAZEL-GLYCERIN EX PADS: 1.0000 "application " | MEDICATED_PAD | CUTANEOUS | Status: DC | PRN

## 2013-09-15 MED ORDER — DIBUCAINE 1 % RE OINT
1.0000 | TOPICAL_OINTMENT | RECTAL | Status: DC | PRN
Start: 2013-09-15 — End: 2013-09-16

## 2013-09-15 MED ORDER — SIMETHICONE 80 MG PO CHEW: 80.0000 mg | CHEWABLE_TABLET | ORAL | Status: DC | PRN

## 2013-09-15 MED ORDER — PRENATAL MULTIVITAMIN CH
1.0000 | ORAL_TABLET | Freq: Every day | ORAL | Status: DC
  Administered 2013-09-15 – 2013-09-16 (×2): 1 via ORAL
  Filled 2013-09-15 (×2): qty 1

## 2013-09-15 MED ORDER — LANOLIN HYDROUS EX OINT: TOPICAL_OINTMENT | CUTANEOUS | Status: DC | PRN

## 2013-09-15 MED ORDER — DIPHENHYDRAMINE HCL 25 MG PO CAPS: 25.0000 mg | ORAL_CAPSULE | Freq: Four times a day (QID) | ORAL | Status: DC | PRN

## 2013-09-15 MED ORDER — OXYCODONE-ACETAMINOPHEN 5-325 MG PO TABS: 1.0000 | ORAL_TABLET | ORAL | Status: DC | PRN

## 2013-09-15 MED ORDER — SENNOSIDES-DOCUSATE SODIUM 8.6-50 MG PO TABS
2.0000 | ORAL_TABLET | ORAL | Status: DC
  Administered 2013-09-16: 2 via ORAL
  Filled 2013-09-15: qty 2

## 2013-09-15 MED ORDER — BENZOCAINE-MENTHOL 20-0.5 % EX AERO: 1.0000 "application " | INHALATION_SPRAY | CUTANEOUS | Status: DC | PRN

## 2013-09-15 MED ORDER — TETANUS-DIPHTH-ACELL PERTUSSIS 5-2.5-18.5 LF-MCG/0.5 IM SUSP: 0.5000 mL | Freq: Once | INTRAMUSCULAR | Status: DC

## 2013-09-15 MED ORDER — IBUPROFEN 600 MG PO TABS
600.0000 mg | ORAL_TABLET | Freq: Four times a day (QID) | ORAL | Status: DC
  Administered 2013-09-15 – 2013-09-16 (×7): 600 mg via ORAL
  Filled 2013-09-15 (×7): qty 1

## 2013-09-15 NOTE — MAU Provider Note (Signed)
Attestation of Attending Supervision of Advanced Practitioner (PA/CNM/NP): Evaluation and management procedures were performed by the Advanced Practitioner under my supervision and collaboration.  I have reviewed the Advanced Practitioner's note and chart, and I agree with the management and plan.  Jacob Stinson, DO Attending Physician Faculty Practice, [facility]  

## 2013-09-15 NOTE — Anesthesia Postprocedure Evaluation (Signed)
  Anesthesia Post-op Note  Patient: Hannah Burns  Procedure(s) Performed: * No procedures listed *  Patient Location: Mother/Baby  Anesthesia Type:Epidural  Level of Consciousness: awake and alert   Airway and Oxygen Therapy: Patient Spontanous Breathing  Post-op Pain: mild  Post-op Assessment: Patient's Cardiovascular Status Stable, Respiratory Function Stable, No signs of Nausea or vomiting, Pain level controlled, No headache, No residual numbness and No residual motor weakness  Post-op Vital Signs: stable  Last Vitals:  Filed Vitals:   09/15/13 0542  BP: 131/84  Pulse: 67  Temp: 36.6 C  Resp: 18    Complications: No apparent anesthesia complications

## 2013-09-15 NOTE — Progress Notes (Signed)
UR chart review completed.  

## 2013-09-15 NOTE — Progress Notes (Signed)
I have seen this patient and agree with the above resident's note.  IUPC placed following AROM to better assess uterine activity.  Decelerations resolved with O2, IV fluids, position change while providers in the room.    LEFTWICH-KIRBY, Jazion Atteberry Certified Nurse-Midwife

## 2013-09-15 NOTE — Progress Notes (Signed)
Post Partum Day 1 Subjective: no complaints, up ad lib, voiding and tolerating PO, small lochia, plans to bottle feed, IUD  Objective: Blood pressure 131/84, pulse 67, temperature 97.9 F (36.6 C), temperature source Oral, resp. rate 18, height 5\' 1"  (1.549 m), weight 95.255 kg (210 lb), SpO2 100.00%, unknown if currently breastfeeding.  Physical Exam:  General: alert, cooperative and no distress Lochia:normal flow Chest: CTAB Heart: RRR no m/r/g Abdomen: +BS, soft, nontender,  Uterine Fundus: firm DVT Evaluation: No evidence of DVT seen on physical exam. Extremities: trace edema   Recent Labs  09/14/13 1710  HGB 13.3  HCT 37.4    Assessment/Plan: Plan for discharge tomorrow   LOS: 1 day   Jacklyn ShellFrances Cresenzo-Dishmon 09/15/2013, 6:31 AM

## 2013-09-16 MED ORDER — IBUPROFEN 600 MG PO TABS: 600.0000 mg | ORAL_TABLET | Freq: Four times a day (QID) | ORAL | Status: DC

## 2013-09-16 NOTE — Discharge Summary (Signed)
Attestation of Attending Supervision of Obstetric Fellow: Evaluation and management procedures were performed by the Obstetric Fellow under my supervision and collaboration.  I have reviewed the Obstetric Fellow's note and chart, and I agree with the management and plan.  Mirah Nevins, MD, FACOG Attending Obstetrician & Gynecologist Faculty Practice, [facility]   

## 2013-09-16 NOTE — Discharge Summary (Signed)
Obstetric Discharge Summary Reason for Admission: onset of labor Prenatal Procedures: NST, GBS ppx Intrapartum Procedures: spontaneous vaginal delivery Postpartum Procedures: none Complications-Operative and Postpartum: 1st degree perineal laceration not requiring repair  Hemoglobin  Date Value Ref Range Status  09/14/2013 13.3  12.0 - 15.0 g/dL Final     HCT  Date Value Ref Range Status  09/14/2013 37.4  36.0 - 46.0 % Final   Delivery Note Pt progressed quickly through the active stage of labor.  After a 10 minute second stage, at 23:17  a viable female was delivered via  (Presentation: LOA ).  APGAR: 9/9 ; weight pending.  40 units of pitocin diluted in 1000cc LR was infused rapidly IV.  The placenta separated spontaneously and delivered via CCT and maternal pushing effort.  It was inspected and appears to be intact with a 3 VC.  There were the following complications: none  Anesthesia: Epidural  Episiotomy: none Lacerations: 1st degree Left labial minora lac not needing repair Suture Repair: n/a Est. Blood Loss (mL): 150  Mom to postpartum.  Baby to Couplet care / Skin to Skin.   Physical Exam:  General: alert, cooperative and no distress Lochia: appropriate Uterine Fundus: firm Incision: na DVT Evaluation: No cords or calf tenderness. No significant calf/ankle edema.  Discharge Diagnoses: Term Pregnancy-delivered  Discharge Information: Date: 09/16/2013 Activity: pelvic rest Diet: routine Medications: PNV and Ibuprofen Condition: stable Instructions: refer to practice specific booklet Discharge to: home Follow-up Information   [facility] Schedule an appointment as soon as possible for a visit in 5 weeks.   Specialty:  Obstetrics and Gynecology   Contact information:   [address] [place] [id number] [phone]      Newborn Data: Live born female  Birth Weight: 6 lb 5.1 oz (2866 g) APGAR: 8, 9  Home with mother.  Pt  presented in early labor and progressed quickly to deliver a live born female via NSVD.   Postpartum care was uncomplicated. She is bottle feeding and desires an IUD for contraception  Makeyla Govan L Reese Stockman 09/16/2013, 8:44 AM

## 2013-09-16 NOTE — Discharge Instructions (Signed)

## 2013-09-20 ENCOUNTER — Encounter: Payer: Medicaid Other | Admitting: Family Medicine

## 2013-10-19 ENCOUNTER — Other Ambulatory Visit (HOSPITAL_COMMUNITY)
Admission: RE | Admit: 2013-10-19 | Discharge: 2013-10-19 | Disposition: A | Discharge status: Home / Self Care | Payer: BC Managed Care – PPO | Source: Ambulatory Visit | Attending: Obstetrics & Gynecology | Admitting: Obstetrics & Gynecology

## 2013-10-19 ENCOUNTER — Encounter: Payer: Self-pay | Admitting: Obstetrics & Gynecology

## 2013-10-19 ENCOUNTER — Ambulatory Visit (INDEPENDENT_AMBULATORY_CARE_PROVIDER_SITE_OTHER): Payer: BC Managed Care – PPO | Admitting: Obstetrics & Gynecology

## 2013-10-19 VITALS — BP 110/71 | HR 74 | Temp 97.8°F | Ht 63.0 in | Wt 185.1 lb

## 2013-10-19 DIAGNOSIS — Z113 Encounter for screening for infections with a predominantly sexual mode of transmission: Secondary | ICD-10-CM | POA: Insufficient documentation

## 2013-10-19 DIAGNOSIS — Z01812 Encounter for preprocedural laboratory examination: Secondary | ICD-10-CM

## 2013-10-19 DIAGNOSIS — Z30430 Encounter for insertion of intrauterine contraceptive device: Secondary | ICD-10-CM

## 2013-10-19 DIAGNOSIS — Z124 Encounter for screening for malignant neoplasm of cervix: Secondary | ICD-10-CM | POA: Diagnosis present

## 2013-10-19 LAB — POCT PREGNANCY, URINE: Preg Test, Ur: NEGATIVE

## 2013-10-19 MED ORDER — LEVONORGESTREL 20 MCG/24HR IU IUD
INTRAUTERINE_SYSTEM | Freq: Once | INTRAUTERINE | Status: AC
  Administered 2013-10-19: 1 via INTRAUTERINE

## 2013-10-19 NOTE — Progress Notes (Signed)
Patient ID: Hannah Benning, female   DOB: 1991-03-05, 23 y.o.   MRN: [id number] Subjective:     Hannah Burns is a 23 y.o. female who presents for a postpartum visit. She is 5 weeks postpartum following a spontaneous vaginal delivery. I have fully reviewed the prenatal and intrapartum course. The delivery was at 40 gestational weeks. Outcome: spontaneous vaginal delivery. Anesthesia: epidural. Postpartum course has been good. Baby's course has been good. Baby is feeding by bottle -  . Bleeding no bleeding. Bowel function is normal. Bladder function is normal. Patient is not sexually active. Contraception method is IUD and wants Mirena. Postpartum depression screening: negative.  The following portions of the patient's history were reviewed and updated as appropriate: allergies, current medications, past family history, past medical history, past social history, past surgical history and problem list.  Review of Systems Pertinent items are noted in HPI.   Objective:    BP 110/71  Pulse 74  Temp(Src) 97.8 F (36.6 C)  Ht 5\' 3"  (1.6 m)  Wt 185 lb 1.6 oz (83.961 kg)  BMI 32.80 kg/m2  Breastfeeding? No  General:  alert, cooperative and no distress   Breasts:     Lungs:    Heart:     Abdomen: soft, non-tender; bowel sounds normal; no masses,  no organomegaly   Vulva:  normal  Vagina: normal vagina  Cervix:  no lesions pap done  Corpus: normal  Adnexa:  normal adnexa  Rectal Exam: Not performed.       Patient identified, informed consent performed, signed copy in chart, time out was performed.  Urine pregnancy test negative.  Speculum placed in the vagina.  Cervix visualized.  Cleaned with Betadine x 2.  Grasped anteriourly with a single tooth tenaculum.  Uterus sounded to 9 cm.  Mirena IUD placed per manufacturer's recommendations.  Strings trimmed to 3 cm.   Patient given post procedure instructions and Mirena care card with expiration date.  Patient is asked to check IUD strings periodically  and follow up in 4-6 weeks for IUD check. Assessment:     normal postpartum exam. Pap smear done at today's visit.   Plan:    1. Contraception: IUD 2. Mirena inserted 3. Follow up in: 4 weeks or as needed.   Adam PhenixJames G Arnold, MD 10/19/2013

## 2013-10-19 NOTE — Patient Instructions (Signed)
Levonorgestrel intrauterine device (IUD) What is this medicine? LEVONORGESTREL IUD (LEE voe nor jes trel) is a contraceptive (birth control) device. The device is placed inside the uterus by a healthcare professional. It is used to prevent pregnancy and can also be used to treat heavy bleeding that occurs during your period. Depending on the device, it can be used for 3 to 5 years. This medicine may be used for other purposes; ask your health care provider or pharmacist if you have questions. COMMON BRAND NAME(S): Mirena, Skyla What should I tell my health care provider before I take this medicine? They need to know if you have any of these conditions: -abnormal Pap smear -cancer of the breast, uterus, or cervix -diabetes -endometritis -genital or pelvic infection now or in the past -have more than one sexual partner or your partner has more than one partner -heart disease -history of an ectopic or tubal pregnancy -immune system problems -IUD in place -liver disease or tumor -problems with blood clots or take blood-thinners -use intravenous drugs -uterus of unusual shape -vaginal bleeding that has not been explained -an unusual or allergic reaction to levonorgestrel, other hormones, silicone, or polyethylene, medicines, foods, dyes, or preservatives -pregnant or trying to get pregnant -breast-feeding How should I use this medicine? This device is placed inside the uterus by a health care professional. Talk to your pediatrician regarding the use of this medicine in children. Special care may be needed. Overdosage: If you think you have taken too much of this medicine contact a poison control center or emergency room at once. NOTE: This medicine is only for you. Do not share this medicine with others. What if I miss a dose? This does not apply. What may interact with this medicine? Do not take this medicine with any of the following  medications: -amprenavir -bosentan -fosamprenavir This medicine may also interact with the following medications: -aprepitant -barbiturate medicines for inducing sleep or treating seizures -bexarotene -griseofulvin -medicines to treat seizures like carbamazepine, ethotoin, felbamate, oxcarbazepine, phenytoin, topiramate -modafinil -pioglitazone -rifabutin -rifampin -rifapentine -some medicines to treat HIV infection like atazanavir, indinavir, lopinavir, nelfinavir, tipranavir, ritonavir -St. John's wort -warfarin This list may not describe all possible interactions. Give your health care provider a list of all the medicines, herbs, non-prescription drugs, or dietary supplements you use. Also tell them if you smoke, drink alcohol, or use illegal drugs. Some items may interact with your medicine. What should I watch for while using this medicine? Visit your doctor or health care professional for regular check ups. See your doctor if you or your partner has sexual contact with others, becomes HIV positive, or gets a sexual transmitted disease. This product does not protect you against HIV infection (AIDS) or other sexually transmitted diseases. You can check the placement of the IUD yourself by reaching up to the top of your vagina with clean fingers to feel the threads. Do not pull on the threads. It is a good habit to check placement after each menstrual period. Call your doctor right away if you feel more of the IUD than just the threads or if you cannot feel the threads at all. The IUD may come out by itself. You may become pregnant if the device comes out. If you notice that the IUD has come out use a backup birth control method like condoms and call your health care provider. Using tampons will not change the position of the IUD and are okay to use during your period. What side effects may I   notice from receiving this medicine? Side effects that you should report to your doctor or  health care professional as soon as possible: -allergic reactions like skin rash, itching or hives, swelling of the face, lips, or tongue -fever, flu-like symptoms -genital sores -high blood pressure -no menstrual period for 6 weeks during use -pain, swelling, warmth in the leg -pelvic pain or tenderness -severe or sudden headache -signs of pregnancy -stomach cramping -sudden shortness of breath -trouble with balance, talking, or walking -unusual vaginal bleeding, discharge -yellowing of the eyes or skin Side effects that usually do not require medical attention (report to your doctor or health care professional if they continue or are bothersome): -acne -breast pain -change in sex drive or performance -changes in weight -cramping, dizziness, or faintness while the device is being inserted -headache -irregular menstrual bleeding within first 3 to 6 months of use -nausea This list may not describe all possible side effects. Call your doctor for medical advice about side effects. You may report side effects to FDA at 1-800-FDA-1088. Where should I keep my medicine? This does not apply. NOTE: This sheet is a summary. It may not cover all possible information. If you have questions about this medicine, talk to your doctor, pharmacist, or health care provider.  2014, Elsevier/Gold Standard. (2011-06-25 13:54:04)  

## 2013-10-23 ENCOUNTER — Encounter: Payer: Self-pay | Admitting: *Deleted

## 2014-01-30 ENCOUNTER — Encounter: Payer: Self-pay | Admitting: General Practice

## 2014-04-09 ENCOUNTER — Encounter: Payer: Self-pay | Admitting: Obstetrics & Gynecology

## 2015-02-06 ENCOUNTER — Emergency Department (HOSPITAL_COMMUNITY): Payer: PRIVATE HEALTH INSURANCE

## 2015-02-06 ENCOUNTER — Emergency Department (HOSPITAL_COMMUNITY): Payer: Self-pay

## 2015-02-06 ENCOUNTER — Encounter (HOSPITAL_COMMUNITY): Payer: Self-pay

## 2015-02-06 ENCOUNTER — Emergency Department (HOSPITAL_COMMUNITY)
Admission: EM | Admit: 2015-02-06 | Discharge: 2015-02-06 | Disposition: A | Discharge status: Home / Self Care | Payer: Self-pay | Attending: Emergency Medicine | Admitting: Emergency Medicine

## 2015-02-06 DIAGNOSIS — R102 Pelvic and perineal pain: Secondary | ICD-10-CM

## 2015-02-06 DIAGNOSIS — N730 Acute parametritis and pelvic cellulitis: Secondary | ICD-10-CM

## 2015-02-06 DIAGNOSIS — Z87891 Personal history of nicotine dependence: Secondary | ICD-10-CM | POA: Insufficient documentation

## 2015-02-06 DIAGNOSIS — N739 Female pelvic inflammatory disease, unspecified: Secondary | ICD-10-CM | POA: Insufficient documentation

## 2015-02-06 LAB — WET PREP, GENITAL
Trich, Wet Prep: NONE SEEN
Yeast Wet Prep HPF POC: NONE SEEN

## 2015-02-06 LAB — COMPREHENSIVE METABOLIC PANEL
ALT: 12 U/L — ABNORMAL LOW (ref 14–54)
ANION GAP: 9 (ref 5–15)
AST: 18 U/L (ref 15–41)
Albumin: 4.3 g/dL (ref 3.5–5.0)
Alkaline Phosphatase: 70 U/L (ref 38–126)
BUN: 9 mg/dL (ref 6–20)
CHLORIDE: 105 mmol/L (ref 101–111)
CO2: 24 mmol/L (ref 22–32)
Calcium: 9.9 mg/dL (ref 8.9–10.3)
Creatinine, Ser: 0.77 mg/dL (ref 0.44–1.00)
GFR calc Af Amer: >60 mL/min (ref >60)
GFR calc non Af Amer: >60 mL/min (ref >60)
Glucose, Bld: 106 mg/dL — ABNORMAL HIGH (ref 65–99)
Potassium: 4.2 mmol/L (ref 3.5–5.1)
SODIUM: 138 mmol/L (ref 135–145)
Total Bilirubin: 1.2 mg/dL (ref 0.3–1.2)
Total Protein: 8.3 g/dL — ABNORMAL HIGH (ref 6.5–8.1)

## 2015-02-06 LAB — URINALYSIS, ROUTINE W REFLEX MICROSCOPIC
Bilirubin Urine: NEGATIVE
Glucose, UA: NEGATIVE mg/dL
Ketones, ur: 15 mg/dL — AB
NITRITE: NEGATIVE
PROTEIN: NEGATIVE mg/dL
Specific Gravity, Urine: 1.028 (ref 1.005–1.030)
UROBILINOGEN UA: 0.2 mg/dL (ref 0.0–1.0)
pH: 6 (ref 5.0–8.0)

## 2015-02-06 LAB — CBC WITH DIFFERENTIAL/PLATELET
BASOS PCT: 0 % (ref 0–1)
Basophils Absolute: 0 10*3/uL (ref 0.0–0.1)
EOS ABS: 0 10*3/uL (ref 0.0–0.7)
EOS PCT: 0 % (ref 0–5)
HCT: 42.5 % (ref 36.0–46.0)
Hemoglobin: 15.1 g/dL — ABNORMAL HIGH (ref 12.0–15.0)
LYMPHS ABS: 1.1 10*3/uL (ref 0.7–4.0)
Lymphocytes Relative: 5 % — ABNORMAL LOW (ref 12–46)
MCH: 32.7 pg (ref 26.0–34.0)
MCHC: 35.5 g/dL (ref 30.0–36.0)
MCV: 92 fL (ref 78.0–100.0)
MONOS PCT: 4 % (ref 3–12)
Monocytes Absolute: 0.8 10*3/uL (ref 0.1–1.0)
Neutro Abs: 18.4 10*3/uL — ABNORMAL HIGH (ref 1.7–7.7)
Neutrophils Relative %: 91 % — ABNORMAL HIGH (ref 43–77)
PLATELETS: 178 10*3/uL (ref 150–400)
RBC: 4.62 MIL/uL (ref 3.87–5.11)
RDW: 12.4 % (ref 11.5–15.5)
WBC: 20.3 10*3/uL — AB (ref 4.0–10.5)

## 2015-02-06 LAB — URINE MICROSCOPIC-ADD ON

## 2015-02-06 LAB — LIPASE, BLOOD: Lipase: 18 U/L — ABNORMAL LOW (ref 22–51)

## 2015-02-06 LAB — I-STAT BETA HCG BLOOD, ED (MC, WL, AP ONLY)

## 2015-02-06 MED ORDER — ONDANSETRON HCL 4 MG/2ML IJ SOLN
4.0000 mg | Freq: Once | INTRAMUSCULAR | Status: AC
  Administered 2015-02-06: 4 mg via INTRAVENOUS
  Filled 2015-02-06: qty 2

## 2015-02-06 MED ORDER — MORPHINE SULFATE (PF) 4 MG/ML IV SOLN
4.0000 mg | Freq: Once | INTRAVENOUS | Status: AC
  Administered 2015-02-06: 4 mg via INTRAVENOUS
  Filled 2015-02-06: qty 1

## 2015-02-06 MED ORDER — DEXTROSE 5 % IV SOLN
1.0000 g | Freq: Once | INTRAVENOUS | Status: AC
  Administered 2015-02-06: 1 g via INTRAVENOUS
  Filled 2015-02-06: qty 10

## 2015-02-06 MED ORDER — SODIUM CHLORIDE 0.9 % IV BOLUS (SEPSIS)
1000.0000 mL | Freq: Once | INTRAVENOUS | Status: AC
  Administered 2015-02-06: 1000 mL via INTRAVENOUS

## 2015-02-06 MED ORDER — IOHEXOL 300 MG/ML  SOLN
25.0000 mL | Freq: Once | INTRAMUSCULAR | Status: AC | PRN
Start: 2015-02-06 — End: 2015-02-06
  Administered 2015-02-06: 25 mL via ORAL

## 2015-02-06 MED ORDER — DOXYCYCLINE HYCLATE 100 MG PO CAPS: 100.0000 mg | ORAL_CAPSULE | Freq: Two times a day (BID) | ORAL | Status: DC

## 2015-02-06 MED ORDER — HYDROCODONE-ACETAMINOPHEN 5-325 MG PO TABS: 1.0000 | ORAL_TABLET | Freq: Four times a day (QID) | ORAL | Status: DC | PRN

## 2015-02-06 MED ORDER — DOXYCYCLINE HYCLATE 100 MG PO TABS
100.0000 mg | ORAL_TABLET | Freq: Once | ORAL | Status: AC
  Administered 2015-02-06: 100 mg via ORAL
  Filled 2015-02-06: qty 1

## 2015-02-06 MED ORDER — IOHEXOL 300 MG/ML  SOLN
100.0000 mL | Freq: Once | INTRAMUSCULAR | Status: AC | PRN
  Administered 2015-02-06: 100 mL via INTRAVENOUS

## 2015-02-06 NOTE — ED Notes (Signed)
Pt placed in gown and in bed. Pt monitored by pulse ox and bp cuff. 

## 2015-02-06 NOTE — ED Notes (Signed)
Pt. In CT. 

## 2015-02-06 NOTE — ED Provider Notes (Signed)
CSN: [id number]     Arrival date & time 02/06/15  1120 History   First MD Initiated Contact with Patient 02/06/15 1127     Chief Complaint  Patient presents with  . Abdominal Pain     (Consider location/radiation/quality/duration/timing/severity/associated sxs/prior Treatment) HPI Comments: Patient is a 24 year old female with no past medical history who presents with abdominal pain that started last night. The pain is located in the lower abdomen and radiates all over her abdomen. The pain is described as aching and severe. The pain started gradually and progressively worsened since the onset. No alleviating/aggravating factors. The patient has tried nothing for symptoms without relief. Associated symptoms include nausea and vomiting. Patient denies fever, headache, diarrhea, chest pain, SOB, dysuria, constipation, abnormal vaginal bleeding/discharge. No history of abdominal surgery. Patient reports she does have an IUD that was placed 15 months ago and has not had problems with it. She does not think this pain is related to her IUD.      History reviewed. No pertinent past medical history. History reviewed. No pertinent past surgical history. Family History  Problem Relation Age of Onset  . Diabetes Maternal Grandmother    Social History  Substance Use Topics  . Smoking status: Former Smoker -- 0.25 packs/day    Types: Cigarettes    Quit date: 07/19/2013  . Smokeless tobacco: Never Used  . Alcohol Use: No   OB History    Gravida Para Term Preterm AB TAB SAB Ectopic Multiple Living   1 1 1       1      Review of Systems  Constitutional: Negative for fever, chills and fatigue.  HENT: Negative for trouble swallowing.   Eyes: Negative for visual disturbance.  Respiratory: Negative for shortness of breath.   Cardiovascular: Negative for chest pain and palpitations.  Gastrointestinal: Positive for nausea, vomiting and abdominal pain. Negative for diarrhea.  Genitourinary: Negative  for dysuria and difficulty urinating.  Musculoskeletal: Negative for arthralgias and neck pain.  Skin: Negative for color change.  Neurological: Negative for dizziness and weakness.  Psychiatric/Behavioral: Negative for dysphoric mood.      Allergies  Review of patient's allergies indicates no known allergies.  Home Medications   Prior to Admission medications   Medication Sig Start Date End Date Taking? Authorizing Provider  ibuprofen (ADVIL,MOTRIN) 600 MG tablet Take 1 tablet (600 mg total) by mouth every 6 (six) hours. 09/16/13   Vale Haven, MD  Prenatal Vit-Fe Fumarate-FA (PRENATAL MULTIVITAMIN) TABS tablet Take 1 tablet by mouth daily at 12 noon. 07/05/13   Minta Balsam, MD   BP 133/81 mmHg  Pulse 105  Temp(Src) 98.9 F (37.2 C) (Oral)  Resp 16  SpO2 99% Physical Exam  Constitutional: She is oriented to person, place, and time. She appears well-developed and well-nourished. No distress.  HENT:  Head: Normocephalic and atraumatic.  Eyes: Conjunctivae and EOM are normal.  Neck: Normal range of motion.  Cardiovascular: Normal rate and regular rhythm.  Exam reveals no gallop and no friction rub.   No murmur heard. Pulmonary/Chest: Effort normal and breath sounds normal. She has no wheezes. She has no rales. She exhibits no tenderness.  Abdominal: Soft. She exhibits no distension. There is tenderness. There is no rebound.  Generalized abdominal tenderness, most notable in the lower abdomen. No peritoneal signs.   Musculoskeletal: Normal range of motion.  Neurological: She is alert and oriented to person, place, and time. Coordination normal.  Speech is goal-oriented. Moves limbs without  ataxia.   Skin: Skin is warm and dry.  Psychiatric: She has a normal mood and affect. Her behavior is normal.  Nursing note and vitals reviewed.   ED Course  Procedures (including critical care time) Labs Review Labs Reviewed  CBC WITH DIFFERENTIAL/PLATELET - Abnormal; Notable for the  following:    WBC 20.3 (*)    Hemoglobin 15.1 (*)    Neutrophils Relative % 91 (*)    Neutro Abs 18.4 (*)    Lymphocytes Relative 5 (*)    All other components within normal limits  COMPREHENSIVE METABOLIC PANEL - Abnormal; Notable for the following:    Glucose, Bld 106 (*)    Total Protein 8.3 (*)    ALT 12 (*)    All other components within normal limits  LIPASE, BLOOD - Abnormal; Notable for the following:    Lipase 18 (*)    All other components within normal limits  URINALYSIS, ROUTINE W REFLEX MICROSCOPIC (NOT AT [facility]) - Abnormal; Notable for the following:    Color, Urine AMBER (*)    APPearance CLOUDY (*)    Hgb urine dipstick MODERATE (*)    Ketones, ur 15 (*)    Leukocytes, UA LARGE (*)    All other components within normal limits  URINE MICROSCOPIC-ADD ON - Abnormal; Notable for the following:    Squamous Epithelial / LPF FEW (*)    Bacteria, UA MANY (*)    All other components within normal limits  I-STAT BETA HCG BLOOD, ED ([facility], [facility], [facility] ONLY)    Imaging Review US Transvaginal Non-ob  02/06/2015   CLINICAL DATA:  Generalized abdominal pain 1 day.  EXAM: TRANSABDOMINAL AND TRANSVAGINAL ULTRASOUND OF PELVIS  TECHNIQUE: Both transabdominal and transvaginal ultrasound examinations of the pelvis were performed. Transabdominal technique was performed for global imaging of the pelvis including uterus, ovaries, adnexal regions, and pelvic cul-de-sac. It was necessary to proceed with endovaginal exam following the transabdominal exam to visualize the endometrium and ovaries.  COMPARISON:  CT 02/06/2015  FINDINGS: Uterus  Uterus is retroverted. Measurements: 4.6 x 5.8 x 8.0 cm. No fibroids or other mass visualized.  Endometrium  Thickness: 1.5 mm. No focal abnormality visualized. IUD in adequate position over the fundal endometrial cavity.  Right ovary  Measurements: 3.6 x 3.3 x 3.8 cm. 3.2 cm simple follicular cyst.  Left ovary  Measurements: 2.7 x 2.3 x 4.1 cm. There is an oval 2 cm  focus slightly hypoechoic to adjacent ovarian stroma with mild heterogeneity and no internal vascularity likely involuting follicle. No internal vascularity.  Other findings  No free fluid.  IMPRESSION: Retroverted uterus which is otherwise unremarkable with IUD in adequate position. 3.2 cm simple right ovarian follicular cyst. Unremarkable left ovary. No free fluid.   Electronically Signed   By: Elberta Fortis M.D.   On: 02/06/2015 17:37   US Pelvis Complete  02/06/2015   CLINICAL DATA:  Generalized abdominal pain 1 day.  EXAM: TRANSABDOMINAL AND TRANSVAGINAL ULTRASOUND OF PELVIS  TECHNIQUE: Both transabdominal and transvaginal ultrasound examinations of the pelvis were performed. Transabdominal technique was performed for global imaging of the pelvis including uterus, ovaries, adnexal regions, and pelvic cul-de-sac. It was necessary to proceed with endovaginal exam following the transabdominal exam to visualize the endometrium and ovaries.  COMPARISON:  CT 02/06/2015  FINDINGS: Uterus  Uterus is retroverted. Measurements: 4.6 x 5.8 x 8.0 cm. No fibroids or other mass visualized.  Endometrium  Thickness: 1.5 mm. No focal abnormality visualized. IUD in adequate position  over the fundal endometrial cavity.  Right ovary  Measurements: 3.6 x 3.3 x 3.8 cm. 3.2 cm simple follicular cyst.  Left ovary  Measurements: 2.7 x 2.3 x 4.1 cm. There is an oval 2 cm focus slightly hypoechoic to adjacent ovarian stroma with mild heterogeneity and no internal vascularity likely involuting follicle. No internal vascularity.  Other findings  No free fluid.  IMPRESSION: Retroverted uterus which is otherwise unremarkable with IUD in adequate position. 3.2 cm simple right ovarian follicular cyst. Unremarkable left ovary. No free fluid.   Electronically Signed   By: Elberta Fortis M.D.   On: 02/06/2015 17:37   Ct Abdomen Pelvis W Contrast  02/06/2015   CLINICAL DATA:  Abdominal pain which began after menses ended. History of abdominal  pain this pain is the worst she has ever felt. History of IUD 15 months ago. Abdominal pain since IUD placed.  EXAM: CT ABDOMEN AND PELVIS WITH CONTRAST  TECHNIQUE: Multidetector CT imaging of the abdomen and pelvis was performed using the standard protocol following bolus administration of intravenous contrast.  CONTRAST:  OMNIPAQUE IOHEXOL 300 MG/ML  SOLN  COMPARISON:  01/21/2010  FINDINGS: Lower chest: No pulmonary nodules, pleural effusions, or infiltrates. Heart size is normal. No imaged pericardial effusion or significant coronary artery calcifications. Small low-attenuation region is identified adjacent to the falciform ligament of the liver, consistent with focal fatty infiltration. No focal abnormality identified within the spleen, pancreas, or adrenal glands. Gallbladder is present.  Upper abdomen: The stomach and small bowel loops are normal in appearance.  Gastrointestinal tract: The stomach and small bowel loops are normal in appearance. The appendix is well seen and has a normal appearance. Colonic loops have a normal appearance.  Pelvis: The uterus is present. Intrauterine device in the central uterus. Retroverted uterus. Right ovarian low-attenuation is 3.5 cm. Left ovarian low-attenuation is 3.7 cm. There is a small amount of stranding anterior to the proximal ascending colon and within the fat anterior to the bladder and lower pelvic small bowel loops.  Retroperitoneum: Small nonspecific mesenteric lymph nodes are identified. No significant adenopathy. No evidence for aortic aneurysm.  Abdominal wall: Unremarkable.  Osseous structures: Unremarkable.  No acute fractures.  IMPRESSION: 1. Mild hazy appearance of the mesentery and pericolonic fat within the lower anterior pelvis and right lower quadrant. Given the presence of bilateral adnexal low-attenuation lesions, and elevated white count, bilateral tubo-ovarian abscesses should be considered. Pelvic ultrasound is recommended. 2. Focal fatty  infiltration adjacent to the falciform ligament of the liver. 3. Intrauterine device in expected location of the central uterus.  The salient findings were discussed with the emergency room provideron 02/06/2015 at 4:48 pm.   Electronically Signed   By: Norva Pavlov M.D.   On: 02/06/2015 16:53   I have personally reviewed and evaluated these images and lab results as part of my medical decision-making.   EKG Interpretation None      MDM   Final diagnoses:  Adnexal tenderness  PID (acute pelvic inflammatory disease)   12:52 PM Labs and urinalysis pending. Patient will have fluids, morphine and zofran for symptoms. Vitals stable and patient afebrile.   CT abdomen pelvis pending. Patient signed out to Dr. Lafayette Dragon.    Emilia Beck, PA-C 02/07/15 1250  Blane Ohara, MD 02/09/15 [phone]

## 2015-02-06 NOTE — ED Notes (Addendum)
Pt. Has hx of painful menstrual cramps. Pt. Ended menses yesterday, but states that her cramping has gotten severe as of last night when she got in bed around 8pm. She has been seen in the hospital before for abd. Pain. She states that abd. Pain is the 'worst she has ever felt'. Pt. Had IUD placed 15 months ago after her child was born. Reports no abd. Pain since IUD placement.

## 2015-02-06 NOTE — Discharge Instructions (Signed)
Pelvic Inflammatory Disease °Pelvic inflammatory disease (PID) refers to an infection in some or all of the female organs. The infection can be in the uterus, ovaries, fallopian tubes, or the surrounding tissues in the pelvis. PID can cause abdominal or pelvic pain that comes on suddenly (acute pelvic pain). PID is a serious infection because it can lead to lasting (chronic) pelvic pain or the inability to have children (infertile).  °CAUSES  °The infection is often caused by the normal bacteria found in the vaginal tissues. PID may also be caused by an infection that is spread during sexual contact. PID can also occur following:  °· The birth of a baby.   °· A miscarriage.   °· An abortion.   °· Major pelvic surgery.   °· The use of an intrauterine device (IUD).   °· A sexual assault.   °RISK FACTORS °Certain factors can put a person at higher risk for PID, such as: °· Being younger than 25 years. °· Being sexually active at a young age. °· Using nonbarrier contraception. °· Having multiple sexual partners. °· Having sex with someone who has symptoms of a genital infection. °· Using oral contraception. °Other times, certain behaviors can increase the possibility of getting PID, such as: °· Having sex during your period. °· Using a vaginal douche. °· Having an intrauterine device (IUD) in place. °SYMPTOMS  °· Abdominal or pelvic pain.   °· Fever.   °· Chills.   °· Abnormal vaginal discharge. °· Abnormal uterine bleeding.   °· Unusual pain shortly after finishing your period. °DIAGNOSIS  °Your caregiver will choose some of the following methods to make a diagnosis, such as:  °· Performing a physical exam and history. A pelvic exam typically reveals a very tender uterus and surrounding pelvis.   °· Ordering laboratory tests including a pregnancy test, blood tests, and urine test.  °· Ordering cultures of the vagina and cervix to check for a sexually transmitted infection (STI). °· Performing an ultrasound.    °· Performing a laparoscopic procedure to look inside the pelvis.   °TREATMENT  °· Antibiotic medicines may be prescribed and taken by mouth.   °· Sexual partners may be treated when the infection is caused by a sexually transmitted disease (STD).   °· Hospitalization may be needed to give antibiotics intravenously. °· Surgery may be needed, but this is rare. °It may take weeks until you are completely well. If you are diagnosed with PID, you should also be checked for human immunodeficiency virus (HIV).   °HOME CARE INSTRUCTIONS  °· If given, take your antibiotics as directed. Finish the medicine even if you start to feel better.   °· Only take over-the-counter or prescription medicines for pain, discomfort, or fever as directed by your caregiver.   °· Do not have sexual intercourse until treatment is completed or as directed by your caregiver. If PID is confirmed, your recent sexual partner(s) will need treatment.   °· Keep your follow-up appointments. °SEEK MEDICAL CARE IF:  °· You have increased or abnormal vaginal discharge.   °· You need prescription medicine for your pain.   °· You vomit.   °· You cannot take your medicines.   °· Your partner has an STD.   °SEEK IMMEDIATE MEDICAL CARE IF:  °· You have a fever.   °· You have increased abdominal or pelvic pain.   °· You have chills.   °· You have pain when you urinate.   °· You are not better after 72 hours following treatment.   °MAKE SURE YOU:  °· Understand these instructions. °· Will watch your condition. °· Will get help right away if you are not doing well or get worse. °  Document Released: 05/25/2005 Document Revised: 09/19/2012 Document Reviewed: 05/21/2011 °[employer]® Patient Information ©2015 [employer]. This information is not intended to replace advice given to you by your health care provider. Make sure you discuss any questions you have with your health care provider. ° °

## 2015-02-06 NOTE — ED Notes (Signed)
Pt with episode of emesis. Approximately 200cc.

## 2015-02-06 NOTE — ED Provider Notes (Signed)
24 yo female that p/w diffuse abd pain x 1 day. Worse in lower b/l abd. W/ N/V.  Pt has UTI. WBC of 20.  Got CTX. Plan to f/u CT abd/pelvis. No prior medical history.   Patient CT scan again for bilateral cyst. Ultrasound was performed to evaluate and these were simple cysts. I performed a pelvic exam on the patient and she had significant cervical motion tenderness, we will treat for PID.  Beverely Risen, MD 02/06/15 1806  Richardean Canal, MD 02/07/15 2035

## 2015-02-07 LAB — GC/CHLAMYDIA PROBE AMP (~~LOC~~) NOT AT ARMC
CHLAMYDIA, DNA PROBE: NEGATIVE
NEISSERIA GONORRHEA: POSITIVE — AB

## 2015-02-07 LAB — RPR: RPR Ser Ql: NONREACTIVE

## 2015-02-07 LAB — HIV ANTIBODY (ROUTINE TESTING W REFLEX): HIV Screen 4th Generation wRfx: NONREACTIVE

## 2015-02-08 ENCOUNTER — Telehealth (HOSPITAL_COMMUNITY): Payer: Self-pay

## 2015-02-08 NOTE — Telephone Encounter (Signed)
Pt returned call.   ID verified x 2.  Pt informed Gonorrhea (+), Chlamydia (-) and HIV & RPR are non reactive.  Pt informed tx rcvd and prescribed approp., notify partner for testing and tx and abstain from sex x 2 wks post tx.

## 2015-02-08 NOTE — Telephone Encounter (Signed)
Results received from [facility].  (+) Gonorrhea.  Treated with Rx for Doxycycline  and Rocephin 1 gram IV.   LVM requesting callback. [employer] form completed and faxed.

## 2016-09-15 ENCOUNTER — Encounter (HOSPITAL_COMMUNITY): Payer: Self-pay | Admitting: Obstetrics and Gynecology

## 2018-04-22 ENCOUNTER — Other Ambulatory Visit: Payer: Self-pay

## 2018-04-22 ENCOUNTER — Emergency Department (HOSPITAL_COMMUNITY): Payer: PRIVATE HEALTH INSURANCE

## 2018-04-22 ENCOUNTER — Encounter (HOSPITAL_COMMUNITY): Payer: Self-pay

## 2018-04-22 ENCOUNTER — Emergency Department (HOSPITAL_COMMUNITY)
Admission: EM | Admit: 2018-04-22 | Discharge: 2018-04-22 | Disposition: A | Discharge status: Home / Self Care | Payer: PRIVATE HEALTH INSURANCE | Attending: Emergency Medicine | Admitting: Emergency Medicine

## 2018-04-22 DIAGNOSIS — Z87891 Personal history of nicotine dependence: Secondary | ICD-10-CM | POA: Insufficient documentation

## 2018-04-22 DIAGNOSIS — Z79899 Other long term (current) drug therapy: Secondary | ICD-10-CM | POA: Diagnosis not present

## 2018-04-22 DIAGNOSIS — M545 Low back pain, unspecified: Secondary | ICD-10-CM

## 2018-04-22 DIAGNOSIS — M542 Cervicalgia: Secondary | ICD-10-CM | POA: Diagnosis not present

## 2018-04-22 DIAGNOSIS — R51 Headache: Secondary | ICD-10-CM | POA: Diagnosis present

## 2018-04-22 LAB — POC URINE PREG, ED: Preg Test, Ur: NEGATIVE

## 2018-04-22 MED ORDER — ACETAMINOPHEN 500 MG PO TABS: 500.0000 mg | ORAL_TABLET | Freq: Four times a day (QID) | ORAL | 0 refills | Status: DC | PRN

## 2018-04-22 MED ORDER — CYCLOBENZAPRINE HCL 10 MG PO TABS
10.0000 mg | ORAL_TABLET | Freq: Once | ORAL | Status: AC
  Administered 2018-04-22: 10 mg via ORAL
  Filled 2018-04-22: qty 1

## 2018-04-22 MED ORDER — ACETAMINOPHEN 500 MG PO TABS
1000.0000 mg | ORAL_TABLET | Freq: Once | ORAL | Status: AC
  Administered 2018-04-22: 1000 mg via ORAL
  Filled 2018-04-22: qty 2

## 2018-04-22 MED ORDER — CYCLOBENZAPRINE HCL 10 MG PO TABS: 10.0000 mg | ORAL_TABLET | Freq: Two times a day (BID) | ORAL | 0 refills | Status: DC | PRN

## 2018-04-22 MED ORDER — IBUPROFEN 600 MG PO TABS: 600.0000 mg | ORAL_TABLET | Freq: Four times a day (QID) | ORAL | 0 refills | Status: DC | PRN

## 2018-04-22 NOTE — ED Notes (Signed)
Patient verbalizes understanding of discharge instructions. Opportunity for questioning and answers were provided. Armband removed by staff, pt discharged from ED.  
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
## 2018-04-22 NOTE — Discharge Instructions (Signed)
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
## 2018-10-18 ENCOUNTER — Ambulatory Visit (INDEPENDENT_AMBULATORY_CARE_PROVIDER_SITE_OTHER): Payer: Self-pay | Admitting: *Deleted

## 2018-10-18 ENCOUNTER — Encounter: Payer: Self-pay | Admitting: General Practice

## 2018-10-18 ENCOUNTER — Other Ambulatory Visit: Payer: Self-pay

## 2018-10-18 VITALS — BP 128/78 | HR 93 | Temp 98.7°F | Ht 64.0 in | Wt 201.0 lb

## 2018-10-18 DIAGNOSIS — Z3200 Encounter for pregnancy test, result unknown: Secondary | ICD-10-CM

## 2018-10-18 DIAGNOSIS — Z3201 Encounter for pregnancy test, result positive: Secondary | ICD-10-CM

## 2018-10-18 DIAGNOSIS — Z3480 Encounter for supervision of other normal pregnancy, unspecified trimester: Secondary | ICD-10-CM | POA: Insufficient documentation

## 2018-10-18 LAB — POCT URINE PREGNANCY: Preg Test, Ur: POSITIVE — AB

## 2018-10-18 MED ORDER — VITAFOL GUMMIES 3.33-0.333-34.8 MG PO CHEW: 3.0000 | CHEWABLE_TABLET | Freq: Every day | ORAL | 12 refills | Status: DC

## 2018-10-18 NOTE — Progress Notes (Signed)
   Hannah Burns presents today for UPT. She has no unusual complaints. LMP: 07/29/2018    OBJECTIVE: Appears well, in no apparent distress.  OB History    Gravida  2   Para  1   Term  1   Preterm      AB      Living  1     SAB      TAB      Ectopic      Multiple      Live Births  1          Home UPT Result: Positive In-Office UPT result: Positive I have reviewed the patient's medical, obstetrical, social, and family histories, and medications.   ASSESSMENT: Positive pregnancy test  PLAN Prenatal care to be completed at: [facility] EDD: 05/05/2019 by LMP Patient will call next week to complete history. PNV sent to pharmacy.  Clovis Pu, RN

## 2018-10-18 NOTE — Patient Instructions (Addendum)
° ° °Coronavirus (COVID-19) and Pregnancy:  Frequently Asked Questions ° ° °How might coronavirus affect my pregnancy? °The data for COVID-19 is limited, but we know that women with other coronavirus infections (such as SARS-CoV) did NOT have miscarriage or stillbirth at higher rates than the general population.  On the other hand, we know that having other respiratory viral infections during pregnancy, such as flu, has been associated with problems like low birth weight and preterm birth. Also, having a high fever early in pregnancy may increase the risk of certain birth defects. ° °Could I transmit coronavirus to my baby during pregnancy or delivery? °Among the few case studies of infants born to mothers with COVID-19 published in peer-reviewed literature, none of the infants tested positive for the virus. And there have been no reports of mother-to-baby transmission for other coronaviruses (MERS-CoV and SARS-CoV). Also, there was no virus detected in samples of amniotic fluid or breast milk. °But there have been a few reports of newborns as young as a few days old with infection, suggesting that a mother can transmit the infection to her infant through close contact after delivery. ° °Is it safe for me to deliver at a hospital where there have been COVID-19 cases? °It should be. We know that COVID-19 is a very scary virus. The good news is that hospitals are taking great precautions to keep patients and healthcare providers safe.  According to the CDC guidelines, when a patient is even suspected to have COVID-19, they should be placed in a negative pressure room. (Think of these rooms as vacuums that suck and filter the air so it's safe for the other people in the hospital.) If there are no rooms available, these patients should be asked to wait at home until they can be accomodated safely. This should make it possible for you to deliver at the hospital without putting you or your baby at risk.  °Hospitals are  also implementing stricter visiting policies to keep patients safe. It's worth calling your hospital to check if there are any new regulations to be aware of. ° °What plans should I make now in case the hospital system is overwhelmed when it's time for me to deliver? °Every hospital is making different plans for dealing with this scenario. Talk with your doctor or midwife once you're at least [redacted] weeks pregnant. °I work in healthcare.  ° °I work in healthcare. Should I ask my doctor to excuse me from work until the baby is born? Should I ask my doctor to excuse me from work until the baby is born? What if I work in a school, the travel industry, or some other high-risk setting? °Healthcare facilities should take care to limit the exposure of pregnant employees to patients with confirmed or suspected COVID-19, just as they would with other infectious cases. If you continue working, be sure to follow the CDC's risk assessment and infection control guidelines. ° °If you work in a school, travel industry, or other high-risk setting, talk with your employer about what it's doing to protect employees and minimize infection risks. Wash your hands often. ° ° °What if my OB gets COVID-19? °If your doctor or midwife tests positive for COVID-19, they will need to be quarantined until they recover and are no longer at risk of transmitting the virus. In this case, you'll be assigned to another OB in your doctor's practice (or you may choose another practitioner yourself). °Ask your new OB or your doctor's office if you should   self-quarantine or be tested for the virus. (It will depend on when you last saw your provider and when that person tested positive.) ° °Should we hold off on trying to conceive because of COVID-19? °At this time, there's no reason to hold off on trying to get pregnant, but the data we have is really limited. For example, we don't think the virus causes birth defects or increases your risk of miscarriage.  But we don't know for sure whether you could transmit COVID-19 to your baby before or during delivery. °We also don't know if the virus lives in semen or can be sexually transmitted. ° °We have a babymoon scheduled in the next few months - should we cancel? °Yes. At this time, the virus has reached more than 140 countries, and there are travel bans to [place], most of [place], and [place]. Places where large numbers of people gather are at highest risk, especially airports and cruise ships.  If you were planning travel in the [place] note that any travel setting increases your risk of exposure, and there are already many places where everyone is being asked to stay home. To see how the virus is spreading, check The [place] Times map based on CDC data.  °For the most current advice to help you avoid exposure, check the CDC's COVID-19 travel page. ° °Will the hospital separate me from my newborn and keep the baby in quarantine? °If you don't have COVID-19 and have not been exposed to the virus, the hospital will not separate you from your newborn. °If you do test positive for COVID-19 or have been exposed but have no symptoms, the CDC, American College of Obstetricians and Gynecologists, and the Society for Maternal-Fetal Medicine all recommend that you be separated from your baby to decrease the risk of transmission to the baby. This would last until you are no longer at risk of transmitting the virus.  °This scenario would, of course, be beyond heartbreaking. Talk to the hospital, your baby's pediatrician, and your family about how to plan for care of your baby in the event that you have to be separated after delivery. And try to make sure you have the emotional support you would need to endure the sadness and stress of having to potentially wait weeks to meet your newborn. ° ° °My hospital is restricting visitors and only allowing one support person. If my support person leaves after the delivery, will they be allowed to  come back? °Every hospital has different policies. Contact your hospital or labor and delivery unit a week or so before delivery to get the most up-to-date restrictions. °In general, if your support person needs to leave, they would be allowed back unless they knew they were exposed to COVID-19 after leaving your company. ° °My mom was planning to fly here to help me care for my new baby after delivery. Should I tell her not to come? °Yes. If your mom is over 60 or has any serious chronic medical conditions (such as heart disease, lung disease, or diabetes), she is at higher risk of serious illness from COVID-19 and should avoid air travel. °And remember that any travel setting increases a person's risk of exposure. So, it may be risky to have her around the baby after she has been traveling. °For the most current advice on traveling, check the CDC's COVID-19 travel page. °

## 2018-10-24 ENCOUNTER — Other Ambulatory Visit: Payer: Self-pay

## 2018-10-24 ENCOUNTER — Ambulatory Visit (INDEPENDENT_AMBULATORY_CARE_PROVIDER_SITE_OTHER): Payer: Self-pay | Admitting: *Deleted

## 2018-10-24 DIAGNOSIS — Z3480 Encounter for supervision of other normal pregnancy, unspecified trimester: Secondary | ICD-10-CM

## 2018-10-24 MED ORDER — ONE-A-DAY WOMENS PRENATAL 1 28-0.8-235 MG PO CAPS: 1.0000 | ORAL_CAPSULE | Freq: Every day | ORAL | 0 refills | Status: DC

## 2018-10-24 NOTE — Progress Notes (Signed)
     Virtual Visit via Telephone Note  I connected with Hannah Burns on 10/24/18 at  2:00 PM EDT by telephone and verified that I am speaking with the correct person using two identifiers.  Location: Patient: Hannah Burns  Provider: Clovis Pu, RN   I discussed the limitations, risks, security and privacy concerns of performing an evaluation and management service by telephone and the availability of in person appointments. I also discussed with the patient that there may be a patient responsible charge related to this service. The patient expressed understanding and agreed to proceed.   History of Present Illness: PRENATAL INTAKE SUMMARY  Ms. Routhier presents today New OB Nurse Interview.  OB History    Gravida  2   Para  1   Term  1   Preterm      AB      Living  1     SAB      TAB      Ectopic      Multiple      Live Births  1          I have reviewed the patient's medical, obstetrical, social, and family histories, medications, and available lab results.  SUBJECTIVE She has no unusual complaints.   Observations/Objective: Initial telehealth nurse interview for history (New OB).  EDD: 05/05/2019 by LMP GA: [redacted]w[redacted]d G2P1001  GENERAL APPEARANCE: oriented to person, place and time, non-face to face visit.  Assessment and Plan: Normal pregnancy Prenatal care-[facility] Pnl/HIV  OB Urine Culture GC/CT/PAP at next visit with midwife HgbEval/SMA/CF at next visit A1C Glucose  PNV samples-pt to pick up U/S <14 weeks ordered  Follow Up Instructions:    I discussed the assessment and treatment plan with the patient. The patient was provided an opportunity to ask questions and all were answered. The patient agreed with the plan and demonstrated an understanding of the instructions.   The patient was advised to call back or seek an in-person evaluation if the symptoms worsen or if the condition fails to improve as anticipated.  I provided 20  minutes of non-face-to-face time during this encounter.   Clovis Pu, RN

## 2018-11-03 ENCOUNTER — Ambulatory Visit (INDEPENDENT_AMBULATORY_CARE_PROVIDER_SITE_OTHER): Payer: Self-pay

## 2018-11-03 ENCOUNTER — Ambulatory Visit (HOSPITAL_COMMUNITY)
Admission: RE | Admit: 2018-11-03 | Discharge: 2018-11-03 | Disposition: A | Discharge status: Home / Self Care | Payer: Medicaid Other | Source: Ambulatory Visit | Attending: Obstetrics and Gynecology | Admitting: Obstetrics and Gynecology

## 2018-11-03 ENCOUNTER — Other Ambulatory Visit: Payer: Self-pay

## 2018-11-03 DIAGNOSIS — Z3480 Encounter for supervision of other normal pregnancy, unspecified trimester: Secondary | ICD-10-CM | POA: Diagnosis present

## 2018-11-03 DIAGNOSIS — O3680X0 Pregnancy with inconclusive fetal viability, not applicable or unspecified: Secondary | ICD-10-CM

## 2018-11-03 NOTE — Progress Notes (Signed)
Pt here today for OB US results. Notified Dr. Alysia Penna who agrees with recommendation of [place] of scheduling in 10 days.   [place] scheduled for June 9th @ 1300.   Notified pt that we scheduled her appt for [place] due to [place] needing to make sure that she is having a good and viable pregnancy.  Informed pt of [place] appt.  Pt verbalized understanding with no further questions.   Addison Naegeli, RN 11/03/18

## 2018-11-03 NOTE — Progress Notes (Signed)
Agree with A & P. 

## 2018-11-09 ENCOUNTER — Telehealth: Payer: Self-pay | Admitting: General Practice

## 2018-11-09 NOTE — Telephone Encounter (Signed)
Left message for patient to give our office a call back in regards to appointment scheduled for tomorrow at 0830.  Patient will have labs completed instead of New OB appointment per Provider.  Mychart message sent as well.

## 2018-11-10 ENCOUNTER — Other Ambulatory Visit: Payer: Self-pay

## 2018-11-10 ENCOUNTER — Encounter: Payer: Self-pay | Admitting: Obstetrics and Gynecology

## 2018-11-10 ENCOUNTER — Other Ambulatory Visit: Payer: Self-pay | Admitting: *Deleted

## 2018-11-10 DIAGNOSIS — O3680X0 Pregnancy with inconclusive fetal viability, not applicable or unspecified: Secondary | ICD-10-CM

## 2018-11-10 NOTE — Progress Notes (Signed)
Patient in clinic for Beta Hcg to determine viability of pregnancy.   Clovis Pu, RN

## 2018-11-11 LAB — BETA HCG QUANT (REF LAB): hCG Quant: 802 m[IU]/mL

## 2018-11-14 ENCOUNTER — Telehealth: Payer: Self-pay | Admitting: Obstetrics and Gynecology

## 2018-11-14 NOTE — Telephone Encounter (Signed)
Discussed results of HCG lab results. Informed the pregnancy is more than likely a miscarriage in progress d/t HCG level being 802 from last week. Discussed in order to see pregnancy on U/S HCG levels are usually 1500-2000. Since she had an U/S where there was pregnancy seen, that would probably mean that her HCG level was >1500 at that time. She would need to have her U/S for a definitive diagnosis of miscarriage. She will need to speak to a provider after her U/S on 6/9. She should expect to have HCG level drawn this week and see provider next week. Comfort offered to patient. Reassurance given that she did not do anything to cause this to happen nor could she have done anything to prevent it from happening. Patient stated she has someone she can speak to about her feelings of grief. Patient verbalized an understanding of the plan of care and agrees.   Laury Deep, CNM

## 2018-11-15 ENCOUNTER — Ambulatory Visit (HOSPITAL_COMMUNITY)
Admission: RE | Admit: 2018-11-15 | Discharge: 2018-11-15 | Disposition: A | Discharge status: Home / Self Care | Payer: Medicaid Other | Source: Ambulatory Visit | Attending: Obstetrics and Gynecology | Admitting: Obstetrics and Gynecology

## 2018-11-15 ENCOUNTER — Other Ambulatory Visit: Payer: Self-pay

## 2018-11-15 DIAGNOSIS — O3680X0 Pregnancy with inconclusive fetal viability, not applicable or unspecified: Secondary | ICD-10-CM | POA: Diagnosis present

## 2018-11-17 ENCOUNTER — Other Ambulatory Visit: Payer: Medicaid Other

## 2018-11-17 ENCOUNTER — Other Ambulatory Visit: Payer: Self-pay

## 2018-11-17 ENCOUNTER — Encounter: Payer: Self-pay | Admitting: General Practice

## 2018-11-17 DIAGNOSIS — O021 Missed abortion: Secondary | ICD-10-CM

## 2018-11-17 NOTE — Progress Notes (Signed)
   Patient in clinic for lab work.  Derl Barrow, RN

## 2018-11-18 ENCOUNTER — Telehealth: Payer: Self-pay | Admitting: Obstetrics and Gynecology

## 2018-11-18 LAB — BETA HCG QUANT (REF LAB): hCG Quant: 328 m[IU]/mL

## 2018-11-18 NOTE — Telephone Encounter (Signed)
Notified of HCG results. Patient states she is having more VB today than yesterday. Advised to seek care in MAU if she is soaking 2 or more pads/hour and/or pain not relieved with ibuprofen 600 mg every 6 hours. Patient verbalized an understanding of the plan of care and agrees.   Laury Deep, CNM

## 2018-11-24 ENCOUNTER — Encounter: Payer: Self-pay | Admitting: Obstetrics and Gynecology

## 2018-11-24 ENCOUNTER — Ambulatory Visit (INDEPENDENT_AMBULATORY_CARE_PROVIDER_SITE_OTHER): Payer: Medicaid Other | Admitting: Obstetrics and Gynecology

## 2018-11-24 ENCOUNTER — Other Ambulatory Visit: Payer: Self-pay

## 2018-11-24 ENCOUNTER — Ambulatory Visit (HOSPITAL_COMMUNITY): Payer: Self-pay

## 2018-11-24 VITALS — BP 121/73 | HR 79 | Temp 98.5°F | Wt 212.0 lb

## 2018-11-24 DIAGNOSIS — O039 Complete or unspecified spontaneous abortion without complication: Secondary | ICD-10-CM | POA: Diagnosis not present

## 2018-11-24 NOTE — Progress Notes (Signed)
  GYNECOLOGY PROGRESS NOTE  History:  28 y.o. G2P1001 presents to [facility] office today for follow-up SAB visit. She reports scant spotting with wiping only.  She denies h/a, dizziness, shortness of breath, n/v, or fever/chills.    The following portions of the patient's history were reviewed and updated as appropriate: allergies, current medications, past family history, past medical history, past social history, past surgical history and problem list. Last pap smear on 10/19/2013 was normal.  Review of Systems:  Pertinent items are noted in HPI.   Objective:  Physical Exam Blood pressure 121/73, pulse 79, temperature 98.5 F (36.9 C), temperature source Oral, weight 212 lb (96.2 kg), last menstrual period 07/29/2018. VS reviewed, nursing note reviewed,  Constitutional: well developed, well nourished, no distress HEENT: normocephalic CV: normal rate Pulm/chest wall: normal effort Breast Exam: deferred Abdomen: soft Neuro: alert and oriented x 3 Skin: warm, dry Psych: affect normal Pelvic exam: Deferred d/t patient only having spotting with wiping  Assessment & Plan:  1. Miscarriage - Beta hCG quant (ref lab) today  continue with weekly HCG levels - Discussed bleeding precautions - Discussed normal SAB expectations - Return for AEX with pap - Patient verbalized an understanding of the plan of care and agrees.   Laury Deep, CNM 11:46 AM

## 2018-11-25 LAB — BETA HCG QUANT (REF LAB): hCG Quant: 108 m[IU]/mL

## 2018-11-28 ENCOUNTER — Encounter: Payer: Self-pay | Admitting: Obstetrics and Gynecology

## 2018-12-01 ENCOUNTER — Other Ambulatory Visit: Payer: Self-pay

## 2018-12-01 ENCOUNTER — Other Ambulatory Visit: Payer: Medicaid Other | Admitting: *Deleted

## 2018-12-01 DIAGNOSIS — O039 Complete or unspecified spontaneous abortion without complication: Secondary | ICD-10-CM

## 2018-12-01 NOTE — Progress Notes (Signed)
   Patient in clinic for lab only.  Derl Barrow, RN

## 2018-12-02 LAB — BETA HCG QUANT (REF LAB): hCG Quant: 70 m[IU]/mL

## 2018-12-08 ENCOUNTER — Other Ambulatory Visit (INDEPENDENT_AMBULATORY_CARE_PROVIDER_SITE_OTHER): Payer: Medicaid Other | Admitting: *Deleted

## 2018-12-08 ENCOUNTER — Other Ambulatory Visit: Payer: Self-pay

## 2018-12-08 ENCOUNTER — Encounter: Payer: Self-pay | Admitting: *Deleted

## 2018-12-08 ENCOUNTER — Ambulatory Visit (INDEPENDENT_AMBULATORY_CARE_PROVIDER_SITE_OTHER): Payer: Medicaid Other | Admitting: Primary Care

## 2018-12-08 ENCOUNTER — Encounter (INDEPENDENT_AMBULATORY_CARE_PROVIDER_SITE_OTHER): Payer: Self-pay | Admitting: Primary Care

## 2018-12-08 VITALS — BP 115/75 | HR 72 | Temp 98.2°F | Ht 64.0 in | Wt 214.0 lb

## 2018-12-08 DIAGNOSIS — T7840XA Allergy, unspecified, initial encounter: Secondary | ICD-10-CM

## 2018-12-08 DIAGNOSIS — Z7689 Persons encountering health services in other specified circumstances: Secondary | ICD-10-CM | POA: Diagnosis not present

## 2018-12-08 DIAGNOSIS — O039 Complete or unspecified spontaneous abortion without complication: Secondary | ICD-10-CM

## 2018-12-08 MED ORDER — LORATADINE 10 MG PO TABS: 10.0000 mg | ORAL_TABLET | Freq: Every day | ORAL | 11 refills | Status: DC

## 2018-12-08 NOTE — Progress Notes (Signed)
   Patient in clinic for beta Hcg quant.  Derl Barrow, RN

## 2018-12-08 NOTE — Progress Notes (Signed)
New Patient Office Visit  Subjective:  Patient ID: Hannah Burns, female    DOB: 06-15-1990  Age: 28 y.o. MRN: [id number]  CC:  Chief Complaint  Patient presents with  . New Patient (Initial Visit)    possible pink eye     HPI Hannah Hitz presents to establish care she only has one concern, her left eyes some morning when she wakes up her eye is red and swollen, denies charge but is watery and itches.No significant medical history. Refer to by woman health she is presently having a miscarriage  and this situation can lead to depression will follow up on mental health status   No past medical history on file.  Past Surgical History:  Procedure Laterality Date  . NO PAST SURGERIES      Family History  Problem Relation Age of Onset  . Diabetes Maternal Grandmother     Social History   Socioeconomic History  . Marital status: Single    Spouse name: Not on file  . Number of children: 1  . Years of education: 12th grade  . Highest education level: High school graduate  Occupational History  . Not on file  Social Needs  . Financial resource strain: Not on file  . Food insecurity    Worry: Never true    Inability: Never true  . Transportation needs    Medical: No    Non-medical: No  Tobacco Use  . Smoking status: Former Smoker    Packs/day: 0.25    Types: Cigarettes    Quit date: 07/19/2013    Years since quitting: 5.3  . Smokeless tobacco: Never Used  Substance and Sexual Activity  . Alcohol use: Not Currently  . Drug use: Not Currently    Frequency: 1.0 times per week    Types: Marijuana  . Sexual activity: Yes    Birth control/protection: None, I.U.D.  Lifestyle  . Physical activity    Days per week: 5 days    Minutes per session: 30 min  . Stress: To some extent  Relationships  . Social connections    Talks on phone: More than three times a week    Gets together: More than three times a week    Attends religious service: More than 4 times per year   Active member of club or organization: No    Attends meetings of clubs or organizations: Never    Relationship status: Never married  . Intimate partner violence    Fear of current or ex partner: No    Emotionally abused: No    Physically abused: No    Forced sexual activity: No  Other Topics Concern  . Not on file  Social History Narrative  . Not on file    ROS Review of Systems  Eyes: Positive for discharge, redness and itching.  Allergic/Immunologic: Positive for environmental allergies.    Objective:   Today's Vitals: BP 115/75 (BP Location: Left Arm, Patient Position: Sitting, Cuff Size: Large)   Pulse 72   Temp 98.2 F (36.8 C) (Oral)   Ht 5\' 4"  (1.626 m)   Wt 214 lb (97.1 kg)   LMP 07/29/2018 (Approximate)   SpO2 97%   BMI 36.73 kg/m   Physical Exam Vitals signs reviewed.  Constitutional:      Appearance: Normal appearance.  HENT:     Head: Normocephalic.     Mouth/Throat:     Mouth: Mucous membranes are moist.  Eyes:     Extraocular Movements: Extraocular  movements intact.     Pupils: Pupils are equal, round, and reactive to light.     Comments: Left eye lid raised area   Neck:     Musculoskeletal: Normal range of motion.  Cardiovascular:     Rate and Rhythm: Normal rate and regular rhythm.  Pulmonary:     Effort: Pulmonary effort is normal.     Breath sounds: Normal breath sounds.  Abdominal:     General: Bowel sounds are normal.     Palpations: Abdomen is soft.  Musculoskeletal: Normal range of motion.  Neurological:     Mental Status: She is alert and oriented to person, place, and time.  Psychiatric:        Mood and Affect: Mood normal.     Assessment & Plan:   Problem List Items Addressed This Visit    None      Outpatient Encounter Medications as of 12/08/2018  Medication Sig  . Prenat-Fe Carbonyl-FA-Omega 3 (ONE-A-DAY WOMENS PRENATAL 1) 28-0.8-235 MG CAPS Take 1 tablet by mouth daily.  . [DISCONTINUED] Prenatal Vit-Fe Phos-FA-Omega  (VITAFOL GUMMIES) 3.33-0.333-34.8 MG CHEW Chew 3 each by mouth daily.   No facility-administered encounter medications on file as of 12/08/2018.    [place] was seen today for new patient (initial visit).  Diagnoses and all orders for this visit:  Encounter to establish care Referred by [facility] to establish care for Primary care  Allergic state, initial encounter Symptoms do not present as conjunctiva eye is clear sclera with blooming flowers and trees with s/s of watery eyes, and itching with out matted together treat for seasonal allergies  Other orders -     loratadine (CLARITIN) 10 MG tablet; Take 1 tablet (10 mg total) by mouth daily.  Miscarriage Present having a spontanous abortion she is continuing to have spotting, Beta HCG (reference lab) are done weekly for levels. This is a tramatic situation I will follow here closely after normal grief to follow up on mental status and be conscious of having increase risk for depression      Kerin Perna, NP

## 2018-12-08 NOTE — Patient Instructions (Signed)
Allergic Rhinitis, Adult Allergic rhinitis is a reaction to allergens in the air. Allergens are tiny specks (particles) in the air that cause your body to have an allergic reaction. This condition cannot be passed from person to person (is not contagious). Allergic rhinitis cannot be cured, but it can be controlled. There are two types of allergic rhinitis:  Seasonal. This type is also called hay fever. It happens only during certain times of the year.  Perennial. This type can happen at any time of the year. What are the causes? This condition may be caused by:  Pollen from grasses, trees, and weeds.  House dust mites.  Pet dander.  Mold. What are the signs or symptoms? Symptoms of this condition include:  Sneezing.  Runny or stuffy nose (nasal congestion).  A lot of mucus in the back of the throat (postnasal drip).  Itchy nose.  Tearing of the eyes.  Trouble sleeping.  Being sleepy during day. How is this treated? There is no cure for this condition. You should avoid things that trigger your symptoms (allergens). Treatment can help to relieve symptoms. This may include:  Medicines that block allergy symptoms, such as antihistamines. These may be given as a shot, nasal spray, or pill.  Shots that are given until your body becomes less sensitive to the allergen (desensitization).  Stronger medicines, if all other treatments have not worked. Follow these instructions at home: Avoiding allergens   Find out what you are allergic to. Common allergens include smoke, dust, and pollen.  Avoid them if you can. These are some of the things that you can do to avoid allergens: ? Replace carpet with wood, tile, or vinyl flooring. Carpet can trap dander and dust. ? Clean any mold found in the home. ? Do not smoke. Do not allow smoking in your home. ? Change your heating and air conditioning filter at least once a month. ? During allergy season:  Keep windows closed as much as  you can. If possible, use air conditioning when there is a lot of pollen in the air.  Use a special filter for allergies with your furnace and air conditioner.  Plan outdoor activities when pollen counts are lowest. This is usually during the early morning or evening hours.  If you do go outdoors when pollen count is high, wear a special mask for people with allergies.  When you come indoors, take a shower and change your clothes before sitting on furniture or bedding. General instructions  Do not use fans in your home.  Do not hang clothes outside to dry.  Wear sunglasses to keep pollen out of your eyes.  Wash your hands right away after you touch household pets.  Take over-the-counter and prescription medicines only as told by your doctor.  Keep all follow-up visits as told by your doctor. This is important. Contact a doctor if:  You have a fever.  You have a cough that does not go away (is persistent).  You start to make whistling sounds when you breathe (wheeze).  Your symptoms do not get better with treatment.  You have thick fluid coming from your nose.  You start to have nosebleeds. Get help right away if:  Your tongue or your lips are swollen.  You have trouble breathing.  You feel dizzy or you feel like you are going to pass out (faint).  You have cold sweats. Summary  Allergic rhinitis is a reaction to allergens in the air.  This condition may be   caused by allergens. These include pollen, dust mites, pet dander, and mold.  Symptoms include a runny, itchy nose, sneezing, or tearing eyes. You may also have trouble sleeping or feel sleepy during the day.  Treatment includes taking medicines and avoiding allergens. You may also get shots or take stronger medicines.  Get help if you have a fever or a cough that does not stop. Get help right away if you are short of breath. This information is not intended to replace advice given to you by your health care  provider. Make sure you discuss any questions you have with your health care provider. Document Released: 09/24/2010 Document Revised: 09/13/2018 Document Reviewed: 12/14/2017 Elsevier Patient Education  2020 Elsevier Inc.  

## 2018-12-08 NOTE — Progress Notes (Signed)
Pt complained of pain and redness in her eye, but it has now gone away/gotten better. No redness today

## 2018-12-09 LAB — BETA HCG QUANT (REF LAB): hCG Quant: 54 m[IU]/mL

## 2018-12-15 ENCOUNTER — Other Ambulatory Visit: Payer: Self-pay

## 2018-12-15 ENCOUNTER — Other Ambulatory Visit: Payer: Medicaid Other | Admitting: *Deleted

## 2018-12-15 DIAGNOSIS — O039 Complete or unspecified spontaneous abortion without complication: Secondary | ICD-10-CM

## 2018-12-15 NOTE — Progress Notes (Unsigned)
   Patient in clinic for repeat Beta Hcg.  Martin, Tamika L, RN  

## 2018-12-16 LAB — BETA HCG QUANT (REF LAB): hCG Quant: 43 m[IU]/mL

## 2018-12-19 NOTE — Telephone Encounter (Signed)
-----   Message from Laury Deep, [place] sent at 12/17/2018  1:13 AM EDT ----- Weekly HCG still needed

## 2018-12-22 ENCOUNTER — Other Ambulatory Visit: Payer: Medicaid Other

## 2018-12-23 ENCOUNTER — Other Ambulatory Visit: Payer: Self-pay

## 2018-12-23 DIAGNOSIS — O360920 Maternal care for other rhesus isoimmunization, second trimester, not applicable or unspecified: Secondary | ICD-10-CM

## 2018-12-23 DIAGNOSIS — O26892 Other specified pregnancy related conditions, second trimester: Secondary | ICD-10-CM

## 2018-12-23 DIAGNOSIS — K760 Fatty (change of) liver, not elsewhere classified: Secondary | ICD-10-CM

## 2018-12-23 DIAGNOSIS — R109 Unspecified abdominal pain: Secondary | ICD-10-CM

## 2018-12-23 DIAGNOSIS — Z3A22 22 weeks gestation of pregnancy: Secondary | ICD-10-CM

## 2018-12-23 DIAGNOSIS — Z833 Family history of diabetes mellitus: Secondary | ICD-10-CM | POA: Insufficient documentation

## 2018-12-23 DIAGNOSIS — Z87891 Personal history of nicotine dependence: Secondary | ICD-10-CM | POA: Insufficient documentation

## 2018-12-23 DIAGNOSIS — O039 Complete or unspecified spontaneous abortion without complication: Secondary | ICD-10-CM

## 2018-12-23 DIAGNOSIS — Z6790 Unspecified blood type, Rh positive: Secondary | ICD-10-CM | POA: Insufficient documentation

## 2018-12-23 DIAGNOSIS — R1084 Generalized abdominal pain: Secondary | ICD-10-CM | POA: Insufficient documentation

## 2018-12-23 NOTE — ED Notes (Signed)
Report to Lauren, RN in MAU.

## 2018-12-24 ENCOUNTER — Encounter (HOSPITAL_COMMUNITY): Payer: Self-pay | Admitting: *Deleted

## 2018-12-24 ENCOUNTER — Inpatient Hospital Stay (HOSPITAL_COMMUNITY)
Admission: EM | Admit: 2018-12-24 | Discharge: 2018-12-24 | Disposition: A | Discharge status: Home / Self Care | Payer: Medicaid Other | Attending: Obstetrics and Gynecology | Admitting: Obstetrics and Gynecology

## 2018-12-24 ENCOUNTER — Inpatient Hospital Stay (HOSPITAL_COMMUNITY): Payer: Medicaid Other

## 2018-12-24 DIAGNOSIS — R1084 Generalized abdominal pain: Secondary | ICD-10-CM | POA: Diagnosis not present

## 2018-12-24 DIAGNOSIS — Z6790 Unspecified blood type, Rh positive: Secondary | ICD-10-CM | POA: Diagnosis not present

## 2018-12-24 DIAGNOSIS — R109 Unspecified abdominal pain: Secondary | ICD-10-CM | POA: Diagnosis not present

## 2018-12-24 DIAGNOSIS — O039 Complete or unspecified spontaneous abortion without complication: Secondary | ICD-10-CM | POA: Diagnosis not present

## 2018-12-24 DIAGNOSIS — O26899 Other specified pregnancy related conditions, unspecified trimester: Secondary | ICD-10-CM

## 2018-12-24 DIAGNOSIS — O26892 Other specified pregnancy related conditions, second trimester: Secondary | ICD-10-CM | POA: Diagnosis not present

## 2018-12-24 DIAGNOSIS — K760 Fatty (change of) liver, not elsewhere classified: Secondary | ICD-10-CM

## 2018-12-24 DIAGNOSIS — Z833 Family history of diabetes mellitus: Secondary | ICD-10-CM | POA: Diagnosis not present

## 2018-12-24 DIAGNOSIS — Z87891 Personal history of nicotine dependence: Secondary | ICD-10-CM | POA: Diagnosis not present

## 2018-12-24 LAB — URINALYSIS, ROUTINE W REFLEX MICROSCOPIC
Bilirubin Urine: NEGATIVE
Glucose, UA: NEGATIVE mg/dL
Hgb urine dipstick: NEGATIVE
Ketones, ur: NEGATIVE mg/dL
Leukocytes,Ua: NEGATIVE
Nitrite: NEGATIVE
Protein, ur: NEGATIVE mg/dL
Specific Gravity, Urine: 1.012 (ref 1.005–1.030)
pH: 5 (ref 5.0–8.0)

## 2018-12-24 LAB — AMYLASE: Amylase: 60 U/L (ref 28–100)

## 2018-12-24 LAB — CBC WITH DIFFERENTIAL/PLATELET
Abs Immature Granulocytes: 0.07 10*3/uL (ref 0.00–0.07)
Basophils Absolute: 0.1 10*3/uL (ref 0.0–0.1)
Basophils Relative: 1 %
Eosinophils Absolute: 0.4 10*3/uL (ref 0.0–0.5)
Eosinophils Relative: 3 %
HCT: 40.5 % (ref 36.0–46.0)
Hemoglobin: 13.9 g/dL (ref 12.0–15.0)
Immature Granulocytes: 1 %
Lymphocytes Relative: 12 %
Lymphs Abs: 1.7 10*3/uL (ref 0.7–4.0)
MCH: 32.8 pg (ref 26.0–34.0)
MCHC: 34.3 g/dL (ref 30.0–36.0)
MCV: 95.5 fL (ref 80.0–100.0)
Monocytes Absolute: 0.9 10*3/uL (ref 0.1–1.0)
Monocytes Relative: 7 %
Neutro Abs: 10.9 10*3/uL — ABNORMAL HIGH (ref 1.7–7.7)
Neutrophils Relative %: 76 %
Platelets: 177 10*3/uL (ref 150–400)
RBC: 4.24 MIL/uL (ref 3.87–5.11)
RDW: 12.1 % (ref 11.5–15.5)
WBC: 14.1 10*3/uL — ABNORMAL HIGH (ref 4.0–10.5)
nRBC: 0 % (ref 0.0–0.2)

## 2018-12-24 LAB — COMPREHENSIVE METABOLIC PANEL
ALT: 34 U/L (ref 0–44)
AST: 22 U/L (ref 15–41)
Albumin: 3.8 g/dL (ref 3.5–5.0)
Alkaline Phosphatase: 58 U/L (ref 38–126)
Anion gap: 9 (ref 5–15)
BUN: 8 mg/dL (ref 6–20)
CO2: 23 mmol/L (ref 22–32)
Calcium: 9.3 mg/dL (ref 8.9–10.3)
Chloride: 104 mmol/L (ref 98–111)
Creatinine, Ser: 0.87 mg/dL (ref 0.44–1.00)
GFR calc Af Amer: >60 mL/min (ref >60)
GFR calc non Af Amer: >60 mL/min (ref >60)
Glucose, Bld: 132 mg/dL — ABNORMAL HIGH (ref 70–99)
Potassium: 3.5 mmol/L (ref 3.5–5.1)
Sodium: 136 mmol/L (ref 135–145)
Total Bilirubin: 0.9 mg/dL (ref 0.3–1.2)
Total Protein: 6.8 g/dL (ref 6.5–8.1)

## 2018-12-24 LAB — LIPASE, BLOOD: Lipase: 31 U/L (ref 11–51)

## 2018-12-24 LAB — HCG, QUANTITATIVE, PREGNANCY: hCG, Beta Chain, Quant, S: 36 m[IU]/mL — ABNORMAL HIGH (ref <5)

## 2018-12-24 NOTE — Discharge Instructions (Signed)
Miscarriage °A miscarriage is the loss of an unborn baby (fetus) before the 20th week of pregnancy. Most miscarriages happen during the first 3 months of pregnancy. Sometimes, a miscarriage can happen before a woman knows that she is pregnant. °Having a miscarriage can be an emotional experience. If you have had a miscarriage, talk with your health care provider about any questions you may have about miscarrying, the grieving process, and your plans for future pregnancy. °What are the causes? °A miscarriage may be caused by: °· Problems with the genes or chromosomes of the fetus. These problems make it impossible for the baby to develop normally. They are often the result of random errors that occur early in the development of the baby, and are not passed from parent to child (not inherited). °· Infection of the cervix or uterus. °· Conditions that affect hormone balance in the body. °· Problems with the cervix, such as the cervix opening and thinning before pregnancy is at term (cervical insufficiency). °· Problems with the uterus. These may include: °? A uterus with an abnormal shape. °? Fibroids in the uterus. °? Congenital abnormalities. These are problems that were present at birth. °· Certain medical conditions. °· Smoking, drinking alcohol, or using drugs. °· Injury (trauma). °In many cases, the cause of a miscarriage is not known. °What are the signs or symptoms? °Symptoms of this condition include: °· Vaginal bleeding or spotting, with or without cramps or pain. °· Pain or cramping in the abdomen or lower back. °· Passing fluid, tissue, or blood clots from the vagina. °How is this diagnosed? °This condition may be diagnosed based on: °· A physical exam. °· Ultrasound. °· Blood tests. °· Urine tests. °How is this treated? °Treatment for a miscarriage is sometimes not necessary if you naturally pass all the tissue that was in your uterus. If necessary, this condition may be treated with: °· Dilation and  curettage (D&C). This is a procedure in which the cervix is stretched open and the lining of the uterus (endometrium) is scraped. This is done only if tissue from the fetus or placenta remains in the body (incomplete miscarriage). °· Medicines, such as: °? Antibiotic medicine, to treat infection. °? Medicine to help the body pass any remaining tissue. °? Medicine to reduce (contract) the size of the uterus. These medicines may be given if you have a lot of bleeding. °If you have Rh negative blood and your baby was Rh positive, you will need a shot of a medicine called Rh immunoglobulinto protect your future babies from Rh blood problems. "Rh-negative" and "Rh-positive" refer to whether or not the blood has a specific protein found on the surface of red blood cells (Rh factor). °Follow these instructions at home: °Medicines ° °· Take over-the-counter and prescription medicines only as told by your health care provider. °· If you were prescribed antibiotic medicine, take it as told by your health care provider. Do not stop taking the antibiotic even if you start to feel better. °· Do not take NSAIDs, such as aspirin and ibuprofen, unless they are approved by your health care provider. These medicines can cause bleeding. °Activity °· Rest as directed. Ask your health care provider what activities are safe for you. °· Have someone help with home and family responsibilities during this time. °General instructions °· Keep track of the number of sanitary pads you use each day and how soaked (saturated) they are. Write down this information. °· Monitor the amount of tissue or blood clots that   you pass from your vagina. Save any large amounts of tissue for your health care provider to examine. °· Do not use tampons, douche, or have sex until your health care provider approves. °· To help you and your partner with the process of grieving, talk with your health care provider or seek counseling. °· When you are ready, meet with  your health care provider to discuss any important steps you should take for your health. Also, discuss steps you should take to have a healthy pregnancy in the future. °· Keep all follow-up visits as told by your health care provider. This is important. °Where to find more information °· The American [employer] of Obstetricians and Gynecologists: www.acog.org °· [place] Department of Health and Human Services Office of Women’s Health: www.womenshealth.gov °Contact a health care provider if: °· You have a fever or chills. °· You have a foul smelling vaginal discharge. °· You have more bleeding instead of less. °Get help right away if: °· You have severe cramps or pain in your back or abdomen. °· You pass blood clots or tissue from your vagina that is walnut-sized or larger. °· You soak more than 1 regular sanitary pad in an hour. °· You become light-headed or weak. °· You pass out. °· You have feelings of sadness that take over your thoughts, or you have thoughts of hurting yourself. °Summary °· Most miscarriages happen in the first 3 months of pregnancy. Sometimes miscarriage happens before a woman even knows that she is pregnant. °· Follow your health care provider's instruction for home care. Keep all follow-up appointments. °· To help you and your partner with the process of grieving, talk with your health care provider or seek counseling. °This information is not intended to replace advice given to you by your health care provider. Make sure you discuss any questions you have with your health care provider. °Document Released: 11/18/2000 Document Revised: 09/16/2018 Document Reviewed: 06/30/2016 °Elsevier Patient Education © 2020 Elsevier Inc. ° °

## 2018-12-24 NOTE — MAU Note (Addendum)
PT SAYS SHE HAS A FAILED PREG - FOUND OUT AT 15 WEEKS .   NO BLEEDING . FEELS ABD PAIN.  GOING TO DR EVERY WEEK - UNTIL LABS WERE 0. VOMITED AT HOME

## 2018-12-24 NOTE — MAU Provider Note (Signed)
History     CSN: [id number]  Arrival date and time: 12/23/18 2343   First Provider Initiated Contact with Patient 12/24/18 0122      Chief Complaint  Patient presents with  . Abdominal Pain   Hannah Burns is a 28 y.o. G3P1001 at 159w4d who presents to MAU for abdominal pain. Pt reports by her LMP she was 15wks when she experienced a failed pregnancy, but by US pt was measuring 5-6weeks.  Onset: tonight around 2300 Location: generalized abdomen Duration: 2.5hrs (pt states the pain started and she came right in) Character: pt states she felt like she needed to have a bowel movement so she went to the bathroom, had a successful bowel movement and reports the pain improved, pt denies constipation; sharp, stabbing Aggravating/Associated: none/none Relieving: bowel movement Treatment: none Severity: 6/10 (started at 10/10), pt reports pain is "fading away"  Pt denies VB, vaginal discharge/odor/itching. Pt denies N/V, abdominal pain, constipation, diarrhea, or urinary problems. Pt denies fever, chills, fatigue, sweating or changes in appetite. Pt denies SOB or chest pain. Pt denies dizziness, HA, light-headedness, weakness.  Problems this pregnancy include: failed pregnancy. Allergies? NKDA Current medications/supplements? PNVs Prenatal care provider? [facility], next appt unscheduled, likely next Thursday   OB History    Gravida  3   Para  1   Term  1   Preterm      AB      Living  1     SAB      TAB      Ectopic      Multiple      Live Births  1           History reviewed. No pertinent past medical history.  Past Surgical History:  Procedure Laterality Date  . NO PAST SURGERIES      Family History  Problem Relation Age of Onset  . Diabetes Maternal Grandmother     Social History   Tobacco Use  . Smoking status: Former Smoker    Packs/day: 0.25    Types: Cigarettes    Quit date: 07/19/2013    Years since quitting: 5.4  . Smokeless  tobacco: Never Used  Substance Use Topics  . Alcohol use: Not Currently  . Drug use: Not Currently    Frequency: 1.0 times per week    Types: Marijuana    Comment: LAST TIME SMOKED - JUNE    Allergies: No Known Allergies  Medications Prior to Admission  Medication Sig Dispense Refill Last Dose  . Prenat-Fe Carbonyl-FA-Omega 3 (ONE-A-DAY WOMENS PRENATAL 1) 28-0.8-235 MG CAPS Take 1 tablet by mouth daily. 30 capsule 0 12/24/2018 at Unknown time  . loratadine (CLARITIN) 10 MG tablet Take 1 tablet (10 mg total) by mouth daily. 30 tablet 11 More than a month at Unknown time    Review of Systems  Constitutional: Negative for chills, diaphoresis, fatigue and fever.  Respiratory: Negative for shortness of breath.   Cardiovascular: Negative for chest pain.  Gastrointestinal: Positive for abdominal pain. Negative for constipation, diarrhea, nausea and vomiting.  Genitourinary: Negative for dysuria, flank pain, frequency, pelvic pain, urgency, vaginal bleeding and vaginal discharge.  Neurological: Negative for dizziness, weakness, light-headedness and headaches.   Physical Exam   Blood pressure 124/65, pulse 61, temperature 98.7 F (37.1 C), temperature source Oral, resp. rate 20, height 5\' 4"  (1.626 m), weight 98 kg, last menstrual period 07/29/2018, SpO2 99 %, unknown if currently breastfeeding.  Patient Vitals for the past 24 hrs:  BP  Temp Temp src Pulse Resp SpO2 Height Weight  12/24/18 0112 124/65 - - 61 - - - -  12/24/18 0048 119/73 98.7 F (37.1 C) Oral 60 20 - 5\' 4"  (1.626 m) 98 kg  12/23/18 2346 (!) 140/95 98.1 F (36.7 C) Oral 63 (!) 22 99 % - -   Physical Exam  Constitutional: She is oriented to person, place, and time. She appears well-developed and well-nourished. No distress.  HENT:  Head: Normocephalic and atraumatic.  Respiratory: Effort normal.  GI: Soft. She exhibits no distension and no mass. There is no abdominal tenderness. There is no rebound and no guarding.   Neurological: She is alert and oriented to person, place, and time.  Skin: Skin is warm and dry. She is not diaphoretic.  Psychiatric: She has a normal mood and affect. Her behavior is normal. Judgment and thought content normal.   Results for orders placed or performed during the hospital encounter of 12/24/18 (from the past 24 hour(s))  CBC with Differential/Platelet     Status: Abnormal   Collection Time: 12/24/18  1:45 AM  Result Value Ref Range   WBC 14.1 (H) 4.0 - 10.5 K/uL   RBC 4.24 3.87 - 5.11 MIL/uL   Hemoglobin 13.9 12.0 - 15.0 g/dL   HCT 40.940.5 81.136.0 - 91.446.0 %   MCV 95.5 80.0 - 100.0 fL   MCH 32.8 26.0 - 34.0 pg   MCHC 34.3 30.0 - 36.0 g/dL   RDW 78.212.1 95.611.5 - 21.315.5 %   Platelets 177 150 - 400 K/uL   nRBC 0.0 0.0 - 0.2 %   Neutrophils Relative % 76 %   Neutro Abs 10.9 (H) 1.7 - 7.7 K/uL   Lymphocytes Relative 12 %   Lymphs Abs 1.7 0.7 - 4.0 K/uL   Monocytes Relative 7 %   Monocytes Absolute 0.9 0.1 - 1.0 K/uL   Eosinophils Relative 3 %   Eosinophils Absolute 0.4 0.0 - 0.5 K/uL   Basophils Relative 1 %   Basophils Absolute 0.1 0.0 - 0.1 K/uL   Immature Granulocytes 1 %   Abs Immature Granulocytes 0.07 0.00 - 0.07 K/uL  Comprehensive metabolic panel     Status: Abnormal   Collection Time: 12/24/18  1:45 AM  Result Value Ref Range   Sodium 136 135 - 145 mmol/L   Potassium 3.5 3.5 - 5.1 mmol/L   Chloride 104 98 - 111 mmol/L   CO2 23 22 - 32 mmol/L   Glucose, Bld 132 (H) 70 - 99 mg/dL   BUN 8 6 - 20 mg/dL   Creatinine, Ser 0.860.87 0.44 - 1.00 mg/dL   Calcium 9.3 8.9 - 57.810.3 mg/dL   Total Protein 6.8 6.5 - 8.1 g/dL   Albumin 3.8 3.5 - 5.0 g/dL   AST 22 15 - 41 U/L   ALT 34 0 - 44 U/L   Alkaline Phosphatase 58 38 - 126 U/L   Total Bilirubin 0.9 0.3 - 1.2 mg/dL   GFR calc non Af Amer >60 >60 mL/min   GFR calc Af Amer >60 >60 mL/min   Anion gap 9 5 - 15  hCG, quantitative, pregnancy     Status: Abnormal   Collection Time: 12/24/18  1:45 AM  Result Value Ref Range   hCG,  Beta Chain, Quant, S 36 (H) <5 mIU/mL  Amylase     Status: None   Collection Time: 12/24/18  1:45 AM  Result Value Ref Range   Amylase 60 28 - 100 U/L  Lipase,  blood     Status: None   Collection Time: 12/24/18  1:45 AM  Result Value Ref Range   Lipase 31 11 - 51 U/L  Urinalysis, Routine w reflex microscopic     Status: None   Collection Time: 12/24/18  1:50 AM  Result Value Ref Range   Color, Urine YELLOW YELLOW   APPearance CLEAR CLEAR   Specific Gravity, Urine 1.012 1.005 - 1.030   pH 5.0 5.0 - 8.0   Glucose, UA NEGATIVE NEGATIVE mg/dL   Hgb urine dipstick NEGATIVE NEGATIVE   Bilirubin Urine NEGATIVE NEGATIVE   Ketones, ur NEGATIVE NEGATIVE mg/dL   Protein, ur NEGATIVE NEGATIVE mg/dL   Nitrite NEGATIVE NEGATIVE   Leukocytes,Ua NEGATIVE NEGATIVE   [place] Abdomen Complete  Result Date: 12/24/2018 CLINICAL DATA:  28 year old pregnant female with abdominal pain. EXAM: ABDOMEN ULTRASOUND COMPLETE COMPARISON:  None. FINDINGS: Gallbladder: No gallstones or wall thickening visualized. No sonographic Murphy sign noted by sonographer. Common bile duct: Diameter: 6 mm Liver: Mildly increased liver echogenicity, likely mild fatty infiltration. Portal vein is patent on color Doppler imaging with normal direction of blood flow towards the liver. IVC: No abnormality visualized. Pancreas: Visualized portion unremarkable. Spleen: Size and appearance within normal limits. Right Kidney: Length: 11 cm. Echogenicity within normal limits. No mass or hydronephrosis visualized. Left Kidney: Length: 11 cm. Echogenicity within normal limits. No mass or hydronephrosis visualized. Abdominal aorta: No aneurysm visualized. Other findings: None. IMPRESSION: Probable mild fatty liver otherwise unremarkable abdominal ultrasound. Electronically Signed   By: Elgie CollardArash  Radparvar M.D.   On: 12/24/2018 02:34    MAU Course  Procedures  MDM -generalized abdominal pain, hCG currently being followed for failed pregnancy -pt  denies VB or abnormal vaginal discharge -UA: WNL -CBC w/ diff: WBCs 14, elevated neutrophils, otherwise WNL -CMP: glucose 132, otherwise WNL -hCG: 36 (down from 43 9days ago, pt is currently being monitored for a miscarriage with monitoring of levels down to zero) -Amylase: 60 (WNL) -Lipase: 31 (WNL) -Abdominal US: mild fatty liver infiltration, otherwise unremarkable -reexamined pt pain @0227 , pt reports pain is improving and is 4/10, without any pain medications -upon provider entering the room to discuss above results with pt, pt sleeping soundly. Upon waking, pt reports pain has resolved and is now 0/10. -pt discharged to home in stable condition  Orders Placed This Encounter  Procedures  . US Abdomen Complete    Standing Status:   Standing    Number of Occurrences:   1    Order Specific Question:   Symptom/Reason for Exam    Answer:   Abdominal pain in pregnancy [[id number]][[id number]]  . CBC with Differential/Platelet    Standing Status:   Standing    Number of Occurrences:   1  . Comprehensive metabolic panel    Standing Status:   Standing    Number of Occurrences:   1  . hCG, quantitative, pregnancy    Standing Status:   Standing    Number of Occurrences:   1  . Amylase    Standing Status:   Standing    Number of Occurrences:   1  . Lipase, blood    Standing Status:   Standing    Number of Occurrences:   1  . Urinalysis, Routine w reflex microscopic    Standing Status:   Standing    Number of Occurrences:   1  . Discharge patient    Order Specific Question:   Discharge disposition    Answer:   01-Home  or Self Care [1]    Order Specific Question:   Discharge patient date    Answer:   12/24/2018   No orders of the defined types were placed in this encounter.  Assessment and Plan   1. Abdominal pain in pregnancy   2. Fatty liver   3. Miscarriage   4. Blood type, Rh positive    Allergies as of 12/24/2018   No Known Allergies     Medication List    TAKE these medications    loratadine 10 MG tablet Commonly known as: CLARITIN Take 1 tablet (10 mg total) by mouth daily.   One-A-Day Womens Prenatal 1 28-0.8-235 MG Caps Take 1 tablet by mouth daily.      -pt advised to keep next appt with OB and to continue to follow hCG levels to 0 -return MAU precautions discussed -pt advised to f/u with PCP to discuss fatty liver -pt discharged to home in stable condition  Hannah Burns 12/24/2018, 3:01 AM

## 2018-12-26 ENCOUNTER — Telehealth: Payer: Self-pay | Admitting: General Practice

## 2018-12-26 NOTE — Telephone Encounter (Signed)
Left message for pt on VM in regards to appt scheduled on 12/30/2018 at 10:00am to check HCG levels.  Asked pt to give our office a call back with any questions or concerns.

## 2018-12-30 ENCOUNTER — Other Ambulatory Visit (INDEPENDENT_AMBULATORY_CARE_PROVIDER_SITE_OTHER): Payer: Medicaid Other | Admitting: *Deleted

## 2018-12-30 ENCOUNTER — Other Ambulatory Visit: Payer: Self-pay

## 2018-12-30 DIAGNOSIS — O039 Complete or unspecified spontaneous abortion without complication: Secondary | ICD-10-CM

## 2018-12-30 NOTE — Progress Notes (Signed)
   Patient in clinic for repeat Beta Hcg.  Martin, Tamika L, RN  

## 2018-12-31 LAB — BETA HCG QUANT (REF LAB): hCG Quant: 27 m[IU]/mL

## 2019-01-06 ENCOUNTER — Other Ambulatory Visit: Payer: Medicaid Other | Admitting: *Deleted

## 2019-01-06 ENCOUNTER — Other Ambulatory Visit: Payer: Self-pay

## 2019-01-06 DIAGNOSIS — O039 Complete or unspecified spontaneous abortion without complication: Secondary | ICD-10-CM

## 2019-01-06 NOTE — Progress Notes (Signed)
   Patient in clinic for repeat Beta Hcg.  Martin, Tamika L, RN  

## 2019-01-07 LAB — BETA HCG QUANT (REF LAB): hCG Quant: 19 m[IU]/mL

## 2019-01-13 ENCOUNTER — Other Ambulatory Visit: Payer: Medicaid Other | Admitting: *Deleted

## 2019-01-13 ENCOUNTER — Other Ambulatory Visit: Payer: Self-pay

## 2019-01-13 DIAGNOSIS — O039 Complete or unspecified spontaneous abortion without complication: Secondary | ICD-10-CM

## 2019-01-13 NOTE — Progress Notes (Signed)
   Patient in clinic for follow up beta Hcg.  Derl Barrow, RN

## 2019-01-14 LAB — BETA HCG QUANT (REF LAB): hCG Quant: 14 m[IU]/mL

## 2019-01-20 ENCOUNTER — Other Ambulatory Visit: Payer: Medicaid Other

## 2019-01-27 ENCOUNTER — Other Ambulatory Visit: Payer: Self-pay

## 2019-01-27 ENCOUNTER — Other Ambulatory Visit (INDEPENDENT_AMBULATORY_CARE_PROVIDER_SITE_OTHER): Payer: Medicaid Other | Admitting: *Deleted

## 2019-01-27 DIAGNOSIS — O039 Complete or unspecified spontaneous abortion without complication: Secondary | ICD-10-CM

## 2019-01-27 NOTE — Progress Notes (Signed)
   Ms. Hannah Burns presents to [facility] for follow-up quant hCG blood draw today.   Patient denies pain or bleeding today. Discussed with patient, we are following hCG levels today.  Results will be back in approximately 1-2 business days.  Valid contact number for patient confirmed. I will call the patient with results.    Derl Barrow 01/27/2019 11:24 AM

## 2019-01-28 LAB — BETA HCG QUANT (REF LAB): hCG Quant: 7 m[IU]/mL

## 2019-03-14 ENCOUNTER — Other Ambulatory Visit: Payer: Self-pay

## 2019-03-14 DIAGNOSIS — Z20822 Contact with and (suspected) exposure to covid-19: Secondary | ICD-10-CM

## 2019-03-16 LAB — NOVEL CORONAVIRUS, NAA: SARS-CoV-2, NAA: NOT DETECTED

## 2019-08-08 ENCOUNTER — Emergency Department (HOSPITAL_COMMUNITY)
Admission: EM | Admit: 2019-08-08 | Discharge: 2019-08-08 | Disposition: A | Discharge status: Home / Self Care | Payer: Medicaid Other | Attending: Emergency Medicine | Admitting: Emergency Medicine

## 2019-08-08 ENCOUNTER — Encounter (HOSPITAL_COMMUNITY): Payer: Self-pay

## 2019-08-08 ENCOUNTER — Other Ambulatory Visit: Payer: Self-pay

## 2019-08-08 DIAGNOSIS — M5441 Lumbago with sciatica, right side: Secondary | ICD-10-CM | POA: Insufficient documentation

## 2019-08-08 DIAGNOSIS — Z87891 Personal history of nicotine dependence: Secondary | ICD-10-CM | POA: Insufficient documentation

## 2019-08-08 DIAGNOSIS — M5431 Sciatica, right side: Secondary | ICD-10-CM

## 2019-08-08 DIAGNOSIS — M545 Low back pain: Secondary | ICD-10-CM | POA: Diagnosis present

## 2019-08-08 LAB — POC URINE PREG, ED: Preg Test, Ur: NEGATIVE

## 2019-08-08 MED ORDER — METHOCARBAMOL 500 MG PO TABS: 500.0000 mg | ORAL_TABLET | Freq: Two times a day (BID) | ORAL | 0 refills | Status: DC

## 2019-08-08 MED ORDER — HYDROCODONE-ACETAMINOPHEN 5-325 MG PO TABS: 1.0000 | ORAL_TABLET | Freq: Four times a day (QID) | ORAL | 0 refills | Status: DC | PRN

## 2019-08-08 MED ORDER — LIDOCAINE 5 % EX PTCH: 1.0000 | MEDICATED_PATCH | CUTANEOUS | 0 refills | Status: DC

## 2019-08-08 NOTE — ED Provider Notes (Signed)
[facility] EMERGENCY DEPARTMENT Provider Note   CSN: [id number] Arrival date & time: 08/08/19  1332     History Chief Complaint  Patient presents with  . Hip Pain    Hannah Burns is a 29 y.o. female.  HPI      Hannah Fontenot is a 29 y.o. female, patient with no pertinent past medical history, presenting to the ED with right lower back back for 4 days. Pain radiates into the right lower extremity, 10/10, described as a tightness and soreness.  She has tried [employer] for the pain. She helped a family member move last week.  Denies fever/chills, urinary symptoms, numbness, weakness, falls/trauma, abdominal pain, changes in bowel or bladder function, saddle anesthesias, or any other complaints.   History reviewed. No pertinent past medical history.  There are no problems to display for this patient.   Past Surgical History:  Procedure Laterality Date  . NO PAST SURGERIES       OB History    Gravida  3   Para  1   Term  1   Preterm      AB      Living  1     SAB      TAB      Ectopic      Multiple      Live Births  1           Family History  Problem Relation Age of Onset  . Diabetes Maternal Grandmother     Social History   Tobacco Use  . Smoking status: Former Smoker    Packs/day: 0.25    Types: Cigarettes    Quit date: 07/19/2013    Years since quitting: 6.0  . Smokeless tobacco: Never Used  Substance Use Topics  . Alcohol use: Not Currently  . Drug use: Not Currently    Frequency: 1.0 times per week    Types: Marijuana    Comment: LAST TIME SMOKED - JUNE    Home Medications Prior to Admission medications   Medication Sig Start Date End Date Taking? Authorizing Provider  lidocaine (LIDODERM) 5 % Place 1 patch onto the skin daily. Remove & Discard patch within 12 hours or as directed by MD 08/08/19   Anselm Pancoast, PA-C    Allergies    Patient has no known allergies.  Review of Systems   Review of Systems    Constitutional: Negative for chills and fever.  Respiratory: Negative for shortness of breath.   Cardiovascular: Negative for chest pain.  Gastrointestinal: Negative for abdominal pain, nausea and vomiting.  Musculoskeletal: Positive for back pain.  Neurological: Negative for weakness and numbness.  All other systems reviewed and are negative.   Physical Exam Updated Vital Signs BP 128/86 (BP Location: Right Arm)   Pulse 91   Temp 98.5 F (36.9 C)   Resp 18   Ht 5\' 2"  (1.575 m)   Wt 95.3 kg   SpO2 99%   BMI 38.41 kg/m   Physical Exam Vitals and nursing note reviewed.  Constitutional:      General: She is not in acute distress.    Appearance: She is well-developed. She is not diaphoretic.  HENT:     Head: Normocephalic and atraumatic.     Mouth/Throat:     Mouth: Mucous membranes are moist.     Pharynx: Oropharynx is clear.  Eyes:     Conjunctiva/sclera: Conjunctivae normal.  Cardiovascular:     Rate and Rhythm:  Normal rate and regular rhythm.     Pulses: Normal pulses.          Radial pulses are 2+ on the right side and 2+ on the left side.       Posterior tibial pulses are 2+ on the right side and 2+ on the left side.     Comments: Tactile temperature in the extremities appropriate and equal bilaterally. Pulmonary:     Effort: Pulmonary effort is normal. No respiratory distress.  Abdominal:     Palpations: Abdomen is soft.     Tenderness: There is no abdominal tenderness. There is no guarding.  Musculoskeletal:     Cervical back: Neck supple.     Right lower leg: No edema.     Left lower leg: No edema.     Comments: Tenderness to the right lumbar musculature into the right buttocks. Full range of motion in the right hip and knee without noted difficulty. Normal motor function intact in all extremities. No midline spinal tenderness.   Skin:    General: Skin is warm and dry.  Neurological:     Mental Status: She is alert.     Comments: Sensation grossly intact  to light touch in the lower extremities bilaterally. No saddle anesthesias. Strength 5/5 in the bilateral lower extremities. No noted gait deficit. Coordination intact.  Psychiatric:        Mood and Affect: Mood and affect normal.        Speech: Speech normal.        Behavior: Behavior normal.     ED Results / Procedures / Treatments   Labs (all labs ordered are listed, but only abnormal results are displayed) Labs Reviewed  POC URINE PREG, ED    EKG None  Radiology No results found.  Procedures Procedures (including critical care time)  Medications Ordered in ED Medications - No data to display  ED Course  I have reviewed the triage vital signs and the nursing notes.  Pertinent labs & imaging results that were available during my care of the patient were reviewed by me and considered in my medical decision making (see chart for details).    MDM Rules/Calculators/A&P                      Patient presents with right lower back pain in a pattern suggestive of sciatica.  No focal neurologic deficits.  No findings to give concern for neurosurgical emergency, such as cauda equina. The patient was given instructions for home care as well as return precautions. Patient voices understanding of these instructions, accepts the plan, and is comfortable with discharge.    Final Clinical Impression(s) / ED Diagnoses Final diagnoses:  Sciatica of right side    Rx / DC Orders ED Discharge Orders         Ordered    lidocaine (LIDODERM) 5 %  Every 24 hours     08/08/19 1458           Lorayne Bender, PA-C 08/08/19 1522    Virgel Manifold, MD 08/09/19 [phone]

## 2019-08-08 NOTE — Discharge Instructions (Signed)
  Take it easy, but do not lay around too much as this may make any stiffness worse.  Antiinflammatory medications: Take 600 mg of ibuprofen every 6 hours or 440 mg (over the counter dose) to 500 mg (prescription dose) of naproxen every 12 hours for the next 3 days. After this time, these medications may be used as needed for pain. Take these medications with food to avoid upset stomach. Choose only one of these medications, do not take them together. Acetaminophen (generic for Tylenol): Should you continue to have additional pain while taking the ibuprofen or naproxen, you may add in acetaminophen as needed. Your daily total maximum amount of acetaminophen from all sources should be limited to 4000mg /day for persons without liver problems, or 2000mg /day for those with liver problems. Vicodin: May take Vicodin (hydrocodone-acetaminophen) as needed for severe pain.   Do not drive or perform other dangerous activities while taking this medication as it can cause drowsiness as well as changes in reaction time and judgement.   Please note that each pill of Vicodin contains 325 mg of acetaminophen (generic for Tylenol) and the above dosage limits apply. Methocarbamol: Methocarbamol (generic for Robaxin) is a muscle relaxer and can help relieve stiff muscles or muscle spasms.  Do not drive or perform other dangerous activities while taking this medication as it can cause drowsiness as well as changes in reaction time and judgement. Lidocaine patches: These are available via either prescription or over-the-counter. The over-the-counter option may be more economical one and are likely just as effective. There are multiple over-the-counter brands, such as Salonpas. Ice: May apply ice to the area over the next 24 hours for 15 minutes at a time to reduce pain, inflammation, and swelling, if present. Exercises: Be sure to perform the attached exercises starting with three times a week and working up to performing them  daily. This is an essential part of preventing long term problems.  Follow up: Follow up with a primary care provider or orthopedist for any future management of these complaints. Be sure to follow up within 7-10 days. Return: Return to the ED should symptoms worsen.  For prescription assistance, may try using prescription discount sites or apps, such as goodrx.com

## 2019-08-08 NOTE — ED Triage Notes (Signed)
Pt arrives POV for eval of R hip pain radiating down leg to knee. Pt reports she feels as though fluid is building up in R knee as well. States she was helping her sister move last week but "nothing strenous". Denies other known injury/trauma. Denies N/T or weakness to leg.

## 2019-08-08 NOTE — ED Notes (Signed)
Patient given discharge instructions patient verbalizes understanding. 

## 2019-08-29 ENCOUNTER — Ambulatory Visit (INDEPENDENT_AMBULATORY_CARE_PROVIDER_SITE_OTHER): Payer: Medicaid Other | Admitting: *Deleted

## 2019-08-29 ENCOUNTER — Other Ambulatory Visit: Payer: Self-pay

## 2019-08-29 ENCOUNTER — Other Ambulatory Visit (HOSPITAL_COMMUNITY)
Admission: RE | Admit: 2019-08-29 | Discharge: 2019-08-29 | Disposition: A | Discharge status: Home / Self Care | Payer: Medicaid Other | Source: Ambulatory Visit | Attending: Obstetrics and Gynecology | Admitting: Obstetrics and Gynecology

## 2019-08-29 VITALS — BP 138/78 | HR 86 | Temp 98.0°F | Wt 210.4 lb

## 2019-08-29 DIAGNOSIS — Z202 Contact with and (suspected) exposure to infections with a predominantly sexual mode of transmission: Secondary | ICD-10-CM

## 2019-08-29 DIAGNOSIS — A599 Trichomoniasis, unspecified: Secondary | ICD-10-CM | POA: Insufficient documentation

## 2019-08-29 MED ORDER — METRONIDAZOLE 500 MG PO TABS: 2000.0000 mg | ORAL_TABLET | Freq: Once | ORAL | 0 refills | Status: AC

## 2019-08-29 NOTE — Progress Notes (Signed)
   SUBJECTIVE:  29 y.o. female denies vaginal discharge. Denies abnormal vaginal bleeding or significant pelvic pain or fever. No UTI symptoms. Patient reported exposure to Trich a week ago from partner. Partner has been treated.  No LMP recorded.  OBJECTIVE:  She appears well, afebrile. Urine dipstick: not done.  ASSESSMENT:  Exposure to Trich  PLAN:  GC, chlamydia, trichomonas, BVAG, CVAG probe sent to lab. Treatment: To be determined once lab results are received ROV prn if symptoms persist or worsen. Flagyl 2gm PO x 1 sent to pharmacy.  Clovis Pu, RN

## 2019-08-30 LAB — CERVICOVAGINAL ANCILLARY ONLY
Bacterial Vaginitis (gardnerella): POSITIVE — AB
Candida Glabrata: NEGATIVE
Candida Vaginitis: NEGATIVE
Chlamydia: NEGATIVE
Comment: NEGATIVE
Comment: NEGATIVE
Comment: NEGATIVE
Comment: NEGATIVE
Comment: NEGATIVE
Comment: NORMAL
Neisseria Gonorrhea: NEGATIVE
Trichomonas: POSITIVE — AB

## 2019-08-30 LAB — RPR: RPR Ser Ql: NONREACTIVE

## 2019-08-30 LAB — HIV ANTIBODY (ROUTINE TESTING W REFLEX): HIV Screen 4th Generation wRfx: NONREACTIVE

## 2019-08-31 ENCOUNTER — Telehealth: Payer: Self-pay | Admitting: *Deleted

## 2019-08-31 NOTE — Telephone Encounter (Signed)
-----   Message from Raelyn Mora, [place] sent at 08/30/2019  5:08 PM EDT ----- No. Let's wait until the TOC. Thanks, I must've missed that.

## 2019-12-17 ENCOUNTER — Ambulatory Visit (HOSPITAL_COMMUNITY)
Admission: EM | Admit: 2019-12-17 | Discharge: 2019-12-17 | Disposition: A | Discharge status: Home / Self Care | Payer: Medicaid Other | Attending: Family Medicine | Admitting: Family Medicine

## 2019-12-17 ENCOUNTER — Other Ambulatory Visit: Payer: Self-pay

## 2019-12-17 ENCOUNTER — Encounter (HOSPITAL_COMMUNITY): Payer: Self-pay

## 2019-12-17 DIAGNOSIS — R519 Headache, unspecified: Secondary | ICD-10-CM

## 2019-12-17 MED ORDER — KETOROLAC TROMETHAMINE 60 MG/2ML IM SOLN
INTRAMUSCULAR | Status: AC
  Filled 2019-12-17: qty 2

## 2019-12-17 MED ORDER — DEXAMETHASONE SODIUM PHOSPHATE 10 MG/ML IJ SOLN
INTRAMUSCULAR | Status: AC
  Filled 2019-12-17: qty 1

## 2019-12-17 MED ORDER — DEXAMETHASONE SODIUM PHOSPHATE 10 MG/ML IJ SOLN
10.0000 mg | Freq: Once | INTRAMUSCULAR | Status: AC
  Administered 2019-12-17: 10 mg

## 2019-12-17 MED ORDER — KETOROLAC TROMETHAMINE 60 MG/2ML IM SOLN
60.0000 mg | Freq: Once | INTRAMUSCULAR | Status: AC
  Administered 2019-12-17: 60 mg via INTRAMUSCULAR

## 2019-12-17 NOTE — Discharge Instructions (Signed)
May take Benadryl as well to promote sleep and help with headache.  Go home, rest in quiet dark room. Limit screen time.  Drink plenty of water to ensure adequate hydration.   If no improvement, worsening, or return of symptoms please go to the ER for further evaluation of this severe headache.

## 2019-12-17 NOTE — ED Triage Notes (Signed)
Pt presents with headache X 2 days with some light sensitivity.

## 2019-12-18 NOTE — ED Provider Notes (Signed)
[facility]    CSN: [id number] Arrival date & time: 12/17/19  1351      History   Chief Complaint Chief Complaint  Patient presents with   Headache    HPI Hannah Burns is a 29 y.o. female.   Hannah Lapaglia presents with complaints of headache, worse to left side of head and behind left eye. Started 2-3 days ago and has been waxing and waning. She feels like it causes her face and neck muscles to ache even . No associated nausea or vomiting. No dizziness. No weakness, numbness or tingling. Some change in vision to left eye she feels, which waxes and wanes, no floaters or curtain appearance. Light sensitivity. Denies history of migraines or bad headaches. She did just start a new job. Has tried advil and goodies which haven't helped, hasn't taken anything for pain today. No head injury. LMP 2 weeks ago. No URI symptoms.     ROS per HPI, negative if not otherwise mentioned.      History reviewed. No pertinent past medical history.  There are no problems to display for this patient.   Past Surgical History:  Procedure Laterality Date   NO PAST SURGERIES      OB History    Gravida  3   Para  1   Term  1   Preterm      AB      Living  1     SAB      TAB      Ectopic      Multiple      Live Births  1            Home Medications    Prior to Admission medications   Medication Sig Start Date End Date Taking? Authorizing Provider  HYDROcodone-acetaminophen (NORCO/VICODIN) 5-325 MG tablet Take 1 tablet by mouth every 6 (six) hours as needed for severe pain. Patient not taking: Reported on 08/29/2019 08/08/19   Joy, Ines Bloomer C, PA-C  lidocaine (LIDODERM) 5 % Place 1 patch onto the skin daily. Remove & Discard patch within 12 hours or as directed by MD Patient not taking: Reported on 08/29/2019 08/08/19   Joy, Hillard Danker, PA-C  methocarbamol (ROBAXIN) 500 MG tablet Take 1 tablet (500 mg total) by mouth 2 (two) times daily. Patient not taking: Reported on  08/29/2019 08/08/19   Anselm Pancoast, PA-C    Family History Family History  Problem Relation Age of Onset   Diabetes Maternal Grandmother     Social History Social History   Tobacco Use   Smoking status: Former Smoker    Packs/day: 0.25    Types: Cigarettes    Quit date: 07/19/2013    Years since quitting: 6.4   Smokeless tobacco: Never Used  Vaping Use   Vaping Use: Never used  Substance Use Topics   Alcohol use: Not Currently   Drug use: Not Currently    Frequency: 1.0 times per week    Types: Marijuana    Comment: LAST TIME SMOKED - JUNE     Allergies   Patient has no known allergies.   Review of Systems Review of Systems   Physical Exam Triage Vital Signs ED Triage Vitals [12/17/19 1455]  Enc Vitals Group     BP 115/63     Pulse Rate 92     Resp 18     Temp 98.8 F (37.1 C)     Temp Source Oral     SpO2 99 %  Weight      Height      Head Circumference      Peak Flow      Pain Score 7     Pain Loc      Pain Edu?      Excl. in GC?    No data found.  Updated Vital Signs BP 115/63 (BP Location: Left Arm)    Pulse 92    Temp 98.8 F (37.1 C) (Oral)    Resp 18    LMP 12/04/2019    SpO2 99%   Visual Acuity Right Eye Distance: 20/30 Left Eye Distance: 20/30 Bilateral Distance:    Right Eye Near:   Left Eye Near:    Bilateral Near:     Physical Exam Constitutional:      General: She is not in acute distress.    Appearance: She is well-developed.  HENT:     Head: Normocephalic and atraumatic.     Comments: Some tenderness to left frontal head and temple extending to left scalp and left neck musculature; no palpable or visible vasculature, no visible or palpable rash prese    Mouth/Throat:     Mouth: Mucous membranes are moist.  Eyes:     General: No visual field deficit or scleral icterus.    Extraocular Movements: Extraocular movements intact.     Pupils: Pupils are equal, round, and reactive to light.  Cardiovascular:     Rate and  Rhythm: Normal rate.  Pulmonary:     Effort: Pulmonary effort is normal.  Skin:    General: Skin is warm and dry.  Neurological:     Mental Status: She is alert and oriented to person, place, and time.     Cranial Nerves: No cranial nerve deficit or facial asymmetry.     Sensory: No sensory deficit.     Motor: No weakness.     Coordination: Coordination normal.      UC Treatments / Results  Labs (all labs ordered are listed, but only abnormal results are displayed) Labs Reviewed - No data to display  EKG   Radiology No results found.  Procedures Procedures (including critical care time)  Medications Ordered in UC Medications  ketorolac (TORADOL) injection 60 mg (60 mg Intramuscular Given 12/17/19 1540)  dexamethasone (DECADRON) injection 10 mg (10 mg Other Given 12/17/19 1540)    Initial Impression / Assessment and Plan / UC Course  I have reviewed the triage vital signs and the nursing notes.  Pertinent labs & imaging results that were available during my care of the patient were reviewed by me and considered in my medical decision making (see chart for details).     Arteritis, shingles, glaucoma, migraine considered. Visual acuity wnl which is reassuring. No visible rash or palpable vasculature. No red flag neurological findings on exam. Hasn't taken any medications today. Opted to trial decadron and toradol im (she drove toclinic), with strict er precautions for any persistent or worsening of symptoms. Patient verbalized understanding and agreeable to plan.   Final Clinical Impressions(s) / UC Diagnoses   Final diagnoses:  Bad headache     Discharge Instructions     May take Benadryl as well to promote sleep and help with headache.  Go home, rest in quiet dark room. Limit screen time.  Drink plenty of water to ensure adequate hydration.   If no improvement, worsening, or return of symptoms please go to the ER for further evaluation of this severe headache.  ED Prescriptions    None     PDMP not reviewed this encounter.   Georgetta Haber, NP 12/18/19 1100

## 2020-01-13 ENCOUNTER — Ambulatory Visit (HOSPITAL_COMMUNITY)
Admission: EM | Admit: 2020-01-13 | Discharge: 2020-01-13 | Disposition: A | Discharge status: Home / Self Care | Payer: Medicaid Other | Attending: Emergency Medicine | Admitting: Emergency Medicine

## 2020-01-13 ENCOUNTER — Encounter (HOSPITAL_COMMUNITY): Payer: Self-pay

## 2020-01-13 ENCOUNTER — Other Ambulatory Visit: Payer: Self-pay

## 2020-01-13 DIAGNOSIS — S0501XA Injury of conjunctiva and corneal abrasion without foreign body, right eye, initial encounter: Secondary | ICD-10-CM | POA: Diagnosis not present

## 2020-01-13 DIAGNOSIS — H1011 Acute atopic conjunctivitis, right eye: Secondary | ICD-10-CM

## 2020-01-13 MED ORDER — TETRACAINE HCL 0.5 % OP SOLN
OPHTHALMIC | Status: AC
  Filled 2020-01-13: qty 4

## 2020-01-13 MED ORDER — OLOPATADINE HCL 0.1 % OP SOLN: 1.0000 [drp] | Freq: Every day | OPHTHALMIC | 12 refills | Status: AC

## 2020-01-13 MED ORDER — FLUORESCEIN SODIUM 1 MG OP STRP
ORAL_STRIP | OPHTHALMIC | Status: AC
  Filled 2020-01-13: qty 1

## 2020-01-13 MED ORDER — ERYTHROMYCIN 5 MG/GM OP OINT: 1.0000 "application " | TOPICAL_OINTMENT | Freq: Four times a day (QID) | OPHTHALMIC | 0 refills | Status: AC

## 2020-01-13 NOTE — ED Triage Notes (Signed)
Pt presents to UC for right eye swelling x1 hour. Pt states she was around a family member's dog that could have caused the irritation. Pt denies meds PTA. Pt denies visual disturbances. Pt endorsing pain in right eye.

## 2020-01-13 NOTE — Discharge Instructions (Signed)
Use of allergy drops as well as eye ointment until complete resolution.  Please follow up with your eye doctor next week for recheck.  Return if any worsening.

## 2020-01-13 NOTE — ED Provider Notes (Signed)
[facility]    CSN: [id number] Arrival date & time: 01/13/20  1743      History   Chief Complaint Chief Complaint  Patient presents with  . Facial Swelling    HPI Hannah Burns is a 29 y.o. female.   Hannah Burns presents with complaints of swelling to right eye. Felt like she was able to see some welts around eye as well. Swelling and redness has decreased since being here. Started approximately around 5p today. Inner eye and eye lids. Eye tearing, no discharge. Mild itching. No previous similar. Left eye feels normal. No congestion. Hasn't taken any medications for symptoms. Nothing in the eye as far as she known, no known allergen exposure. Vision feels blurred. Wears glasses, no contacts.    ROS per HPI, negative if not otherwise mentioned.      History reviewed. No pertinent past medical history.  There are no problems to display for this patient.   Past Surgical History:  Procedure Laterality Date  . NO PAST SURGERIES      OB History    Gravida  3   Para  1   Term  1   Preterm      AB      Living  1     SAB      TAB      Ectopic      Multiple      Live Births  1            Home Medications    Prior to Admission medications   Medication Sig Start Date End Date Taking? Authorizing Provider  erythromycin ophthalmic ointment Place 1 application into the right eye 4 (four) times daily for 7 days. 01/13/20 01/20/20  Georgetta Haber, NP  HYDROcodone-acetaminophen (NORCO/VICODIN) 5-325 MG tablet Take 1 tablet by mouth every 6 (six) hours as needed for severe pain. Patient not taking: Reported on 08/29/2019 08/08/19   Joy, Ines Bloomer C, PA-C  lidocaine (LIDODERM) 5 % Place 1 patch onto the skin daily. Remove & Discard patch within 12 hours or as directed by MD Patient not taking: Reported on 08/29/2019 08/08/19   Joy, Hillard Danker, PA-C  methocarbamol (ROBAXIN) 500 MG tablet Take 1 tablet (500 mg total) by mouth 2 (two) times daily. Patient not taking:  Reported on 08/29/2019 08/08/19   Harolyn Rutherford C, PA-C  olopatadine (PATANOL) 0.1 % ophthalmic solution Place 1 drop into both eyes daily for 14 days. 01/13/20 01/27/20  Georgetta Haber, NP    Family History Family History  Problem Relation Age of Onset  . Diabetes Maternal Grandmother     Social History Social History   Tobacco Use  . Smoking status: Former Smoker    Packs/day: 0.25    Types: Cigarettes    Quit date: 07/19/2013    Years since quitting: 6.4  . Smokeless tobacco: Never Used  Vaping Use  . Vaping Use: Never used  Substance Use Topics  . Alcohol use: Not Currently  . Drug use: Not Currently    Frequency: 1.0 times per week    Types: Marijuana    Comment: LAST TIME SMOKED - JUNE     Allergies   Patient has no known allergies.   Review of Systems Review of Systems   Physical Exam Triage Vital Signs ED Triage Vitals  Enc Vitals Group     BP 01/13/20 1840 120/79     Pulse Rate 01/13/20 1840 69     Resp 01/13/20  1840 16     Temp 01/13/20 1840 98.2 F (36.8 C)     Temp Source 01/13/20 1840 Oral     SpO2 01/13/20 1840 97 %     Weight --      Height --      Head Circumference --      Peak Flow --      Pain Score 01/13/20 1842 0     Pain Loc --      Pain Edu? --      Excl. in GC? --    No data found.  Updated Vital Signs BP 120/79 (BP Location: Right Arm)   Pulse 69   Temp 98.2 F (36.8 C) (Oral)   Resp 16   SpO2 97%   Visual Acuity Right Eye Distance: 20/25 Left Eye Distance: 20/25 Bilateral Distance: 20/25  Right Eye Near:   Left Eye Near:    Bilateral Near:     Physical Exam Constitutional:      General: She is not in acute distress.    Appearance: She is well-developed.  Eyes:     General: Lids are everted, no foreign bodies appreciated. Vision grossly intact.        Right eye: No foreign body, discharge or hordeolum.     Extraocular Movements: Extraocular movements intact.     Conjunctiva/sclera:     Right eye: Right conjunctiva  is injected.     Pupils: Pupils are equal, round, and reactive to light.     Right eye: No fluorescein uptake.  Cardiovascular:     Rate and Rhythm: Normal rate.  Pulmonary:     Effort: Pulmonary effort is normal.  Skin:    General: Skin is warm and dry.  Neurological:     Mental Status: She is alert and oriented to person, place, and time.      UC Treatments / Results  Labs (all labs ordered are listed, but only abnormal results are displayed) Labs Reviewed - No data to display  EKG   Radiology No results found.  Procedures Procedures (including critical care time)  Medications Ordered in UC Medications - No data to display  Initial Impression / Assessment and Plan / UC Course  I have reviewed the triage vital signs and the nursing notes.  Pertinent labs & imaging results that were available during my care of the patient were reviewed by me and considered in my medical decision making (see chart for details).     No red flag findings. Maybe very small abrasion on exam, erythromycin provided. Allergies considered as well with drops provided. Follow up recommended. Patient verbalized understanding and agreeable to plan.   Final Clinical Impressions(s) / UC Diagnoses   Final diagnoses:  Abrasion of right cornea, initial encounter  Allergic conjunctivitis of right eye     Discharge Instructions     Use of allergy drops as well as eye ointment until complete resolution.  Please follow up with your eye doctor next week for recheck.  Return if any worsening.     ED Prescriptions    Medication Sig Dispense Auth. Provider   olopatadine (PATANOL) 0.1 % ophthalmic solution Place 1 drop into both eyes daily for 14 days. 5 mL Linus Mako B, NP   erythromycin ophthalmic ointment Place 1 application into the right eye 4 (four) times daily for 7 days. 28 g Georgetta Haber, NP     PDMP not reviewed this encounter.   Georgetta Haber, NP 01/14/20 2349

## 2020-06-18 ENCOUNTER — Ambulatory Visit: Payer: Self-pay

## 2021-06-24 ENCOUNTER — Other Ambulatory Visit: Payer: Self-pay

## 2021-06-24 ENCOUNTER — Emergency Department (HOSPITAL_COMMUNITY)
Admission: EM | Admit: 2021-06-24 | Discharge: 2021-06-25 | Disposition: A | Discharge status: Home / Self Care | Payer: Medicaid Other | Attending: Emergency Medicine | Admitting: Emergency Medicine

## 2021-06-24 DIAGNOSIS — M545 Low back pain, unspecified: Secondary | ICD-10-CM | POA: Insufficient documentation

## 2021-06-24 DIAGNOSIS — Z5321 Procedure and treatment not carried out due to patient leaving prior to being seen by health care provider: Secondary | ICD-10-CM | POA: Insufficient documentation

## 2021-06-24 NOTE — ED Triage Notes (Signed)
Pt reported to ED with c/o pain to lower back that radiates into left leg and foot. Reports history of sciatica.

## 2021-06-24 NOTE — ED Provider Triage Note (Signed)
Emergency Medicine Provider Triage Evaluation Note  Hannah Burns , a 31 y.o. female  was evaluated in triage.  Pt complains of low back radiating into hip and right leg.  Now has some tingling of the leg. Hx of same, told related to sciatica.  Denies any new injury/trauma/falls.  No bowel or bladder incontinence.  Taking tylenol/motrin at home without relief..  Review of Systems  Positive: Low back pain Negative: fever  Physical Exam  BP (!) 168/101 (BP Location: Right Arm)    Pulse 62    Temp 99 F (37.2 C) (Oral)    Resp 14    SpO2 97%  Gen:   Awake, no distress   Resp:  Normal effort  MSK:   Moves extremities without difficulty  Other:    Medical Decision Making  Medically screening exam initiated at 11:29 PM.  Appropriate orders placed.  Hannah Burns was informed that the remainder of the evaluation will be completed by another provider, this initial triage assessment does not replace that evaluation, and the importance of remaining in the ED until their evaluation is complete.  Low back pain.  No focal deficits in triage.  May be related to recurrent sciatica.     Garlon Hatchet, PA-C 06/24/21 [phone]

## 2021-06-25 LAB — PREGNANCY, URINE: Preg Test, Ur: NEGATIVE

## 2021-06-25 LAB — URINALYSIS, ROUTINE W REFLEX MICROSCOPIC
Bilirubin Urine: NEGATIVE
Glucose, UA: NEGATIVE mg/dL
Hgb urine dipstick: NEGATIVE
Ketones, ur: NEGATIVE mg/dL
Leukocytes,Ua: NEGATIVE
Nitrite: NEGATIVE
Protein, ur: NEGATIVE mg/dL
Specific Gravity, Urine: >1.03 — ABNORMAL HIGH (ref 1.005–1.030)
pH: 5.5 (ref 5.0–8.0)

## 2021-06-25 NOTE — ED Notes (Signed)
Patient called x2 wth no response

## 2022-06-27 ENCOUNTER — Other Ambulatory Visit: Payer: Self-pay

## 2022-06-27 ENCOUNTER — Emergency Department (HOSPITAL_COMMUNITY)
Admission: EM | Admit: 2022-06-27 | Discharge: 2022-06-28 | Disposition: A | Discharge status: Home / Self Care | Payer: Medicaid Other | Attending: Emergency Medicine | Admitting: Emergency Medicine

## 2022-06-27 DIAGNOSIS — Z202 Contact with and (suspected) exposure to infections with a predominantly sexual mode of transmission: Secondary | ICD-10-CM | POA: Diagnosis present

## 2022-06-27 LAB — URINALYSIS, ROUTINE W REFLEX MICROSCOPIC
Bilirubin Urine: NEGATIVE
Glucose, UA: NEGATIVE mg/dL
Ketones, ur: 20 mg/dL — AB
Nitrite: NEGATIVE
Protein, ur: NEGATIVE mg/dL
RBC / HPF: >50 RBC/hpf — ABNORMAL HIGH (ref 0–5)
Specific Gravity, Urine: 1.025 (ref 1.005–1.030)
pH: 5 (ref 5.0–8.0)

## 2022-06-27 LAB — I-STAT BETA HCG BLOOD, ED (MC, WL, AP ONLY): I-stat hCG, quantitative: <5 m[IU]/mL (ref <5)

## 2022-06-27 LAB — RAPID HIV SCREEN (HIV 1/2 AB+AG)
HIV 1/2 Antibodies: NONREACTIVE
HIV-1 P24 Antigen - HIV24: NONREACTIVE

## 2022-06-27 NOTE — ED Triage Notes (Signed)
Pt via POV for STD check up. 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 Pt denies any lesion. No burning with urination. No vag discharge.

## 2022-06-27 NOTE — ED Provider Triage Note (Signed)
Emergency Medicine Provider Triage Evaluation Note  Hannah Burns , a 32 y.o. female  was evaluated in triage.  Pt complains of exposure to STD.  States partner told her that he was diagnosed with herpes.  She denies genital lesions.  States she wants to be checked for any/all STD's.  Review of Systems  Positive: STD check Negative: fever  Physical Exam  BP (!) 139/95   Pulse 93   Temp 98.2 F (36.8 C)   Resp 18   Ht 5\' 2"  (1.575 m)   Wt 95.3 kg   SpO2 99%   BMI 38.41 kg/m   Gen:   Awake, no distress   Resp:  Normal effort  MSK:   Moves extremities without difficulty  Other:    Medical Decision Making  Medically screening exam initiated at 10:35 PM.  Appropriate orders placed.  Hannah Hoon was informed that the remainder of the evaluation will be completed by another provider, this initial triage assessment does not replace that evaluation, and the importance of remaining in the ED until their evaluation is complete.  STD exposure.  Denies current symptoms.  Labs ordered.   Larene Pickett, PA-C 06/27/22 2238

## 2022-06-28 LAB — RPR: RPR Ser Ql: NONREACTIVE

## 2022-06-28 NOTE — ED Notes (Signed)
Patient Alert and oriented to baseline. Stable and ambulatory to baseline. Patient verbalized understanding of the discharge instructions.  Patient belongings were taken by the patient.   

## 2022-06-28 NOTE — Discharge Instructions (Addendum)
Check MyChart for the results of the STD testing.  Your HIV was negative, he should check the results for the syphilis, gonorrhea and chlamydia.  If you are positive you should receive a call from our department to send in antibiotics.  Return to the ED for symptoms.  Follow-up with your primary as needed.

## 2022-06-28 NOTE — ED Provider Notes (Signed)
[facility] Provider Note   CSN: [id number] Arrival date & time: 06/27/22  2228     History  Chief Complaint  Patient presents with   Exposure to STD    Niger Richens is a 32 y.o. female.   Exposure to STD     Patient presents to the emergency department due to exposure to STD.  Her significant other recently tested positive for herpes.  Patient is asymptomatic.  Denies any history of untreated STDs, dysuria, abdominal pain, fevers, vaginal discharge.  She is currently on her menstrual cycle so has had hematuria secondary to that.  Home Medications Prior to Admission medications   Medication Sig Start Date End Date Taking? Authorizing Provider  HYDROcodone-acetaminophen (NORCO/VICODIN) 5-325 MG tablet Take 1 tablet by mouth every 6 (six) hours as needed for severe pain. Patient not taking: Reported on 08/29/2019 08/08/19   Joy, Raquel Sarna C, PA-C  lidocaine (LIDODERM) 5 % Place 1 patch onto the skin daily. Remove & Discard patch within 12 hours or as directed by MD Patient not taking: Reported on 08/29/2019 08/08/19   Joy, Helane Gunther, PA-C  methocarbamol (ROBAXIN) 500 MG tablet Take 1 tablet (500 mg total) by mouth 2 (two) times daily. Patient not taking: Reported on 08/29/2019 08/08/19   Lorayne Bender, PA-C      Allergies    Patient has no known allergies.    Review of Systems   Review of Systems  Physical Exam Updated Vital Signs BP 112/73 (BP Location: Left Arm)   Pulse (!) 55   Temp (!) 97.2 F (36.2 C)   Resp 13   Ht 5\' 2"  (1.575 m)   Wt 95.3 kg   SpO2 99%   BMI 38.41 kg/m  Physical Exam Vitals and nursing note reviewed. Exam conducted with a chaperone present.  Constitutional:      General: She is not in acute distress.    Appearance: Normal appearance.  HENT:     Head: Normocephalic and atraumatic.  Eyes:     General: No scleral icterus.    Extraocular Movements: Extraocular movements intact.     Pupils: Pupils are equal,  round, and reactive to light.  Cardiovascular:     Rate and Rhythm: Normal rate and regular rhythm.  Pulmonary:     Effort: Pulmonary effort is normal.     Breath sounds: Normal breath sounds.  Genitourinary:    Comments: Pelvic deferred given asymptomatic Skin:    Coloration: Skin is not jaundiced.  Neurological:     Mental Status: She is alert. Mental status is at baseline.     Coordination: Coordination normal.     ED Results / Procedures / Treatments   Labs (all labs ordered are listed, but only abnormal results are displayed) Labs Reviewed  URINALYSIS, ROUTINE W REFLEX MICROSCOPIC - Abnormal; Notable for the following components:      Result Value   APPearance HAZY (*)    Hgb urine dipstick LARGE (*)    Ketones, ur 20 (*)    Leukocytes,Ua SMALL (*)    RBC / HPF >50 (*)    Bacteria, UA FEW (*)    All other components within normal limits  RAPID HIV SCREEN (HIV 1/2 AB+AG)  RPR  I-STAT BETA HCG BLOOD, ED ([facility], [facility], [facility] ONLY)  GC/CHLAMYDIA PROBE AMP ([place]) NOT AT [facility]    EKG None  Radiology No results found.  Procedures Procedures    Medications Ordered in ED Medications -  No data to display  ED Course/ Medical Decision Making/ A&P                             Medical Decision Making  This is an asymptomatic 32 year old female presenting today with request for STD exposure.  She is requesting STD testing for all possible STDs, she is entirely asymptomatic.  Afebrile, no abdominal tenderness.  She is also not having any discharge or pelvic pain.    Engaged in shared decision-making and will defer pelvic exam and have patient self swab.  RPR, HIV, UA and pregnancy test were ordered.  Patient has hematuria on the UA likely secondary to menstrual cycle, do not suspect nephrolithiasis or UTI given asymptomatic.  Patient will follow-up on MyChart for the results of the STD testing.    Presentation not consistent with sepsis, UTI, nephrolithiasis, PID.  Stable  for discharge at this time.        Final Clinical Impression(s) / ED Diagnoses Final diagnoses:  STD exposure    Rx / DC Orders ED Discharge Orders     None         Sherrill Raring, [place] 06/28/22 1038    Wyvonnia Dusky, MD 06/28/22 1221

## 2022-06-29 LAB — GC/CHLAMYDIA PROBE AMP (~~LOC~~) NOT AT ARMC
Chlamydia: POSITIVE — AB
Comment: NEGATIVE
Comment: NORMAL
Neisseria Gonorrhea: NEGATIVE

## 2022-06-30 ENCOUNTER — Encounter (INDEPENDENT_AMBULATORY_CARE_PROVIDER_SITE_OTHER): Payer: Self-pay | Admitting: Primary Care

## 2022-07-02 ENCOUNTER — Telehealth (HOSPITAL_COMMUNITY): Payer: Self-pay

## 2022-07-02 MED ORDER — DOXYCYCLINE HYCLATE 100 MG PO CAPS: 100.0000 mg | ORAL_CAPSULE | Freq: Two times a day (BID) | ORAL | 0 refills | Status: AC

## 2022-07-09 ENCOUNTER — Ambulatory Visit (INDEPENDENT_AMBULATORY_CARE_PROVIDER_SITE_OTHER): Payer: Medicaid Other | Admitting: Primary Care

## 2023-01-06 ENCOUNTER — Emergency Department (HOSPITAL_COMMUNITY)
Admission: EM | Admit: 2023-01-06 | Discharge: 2023-01-06 | Disposition: A | Discharge status: Home / Self Care | Payer: Medicaid Other | Attending: Emergency Medicine | Admitting: Emergency Medicine

## 2023-01-06 ENCOUNTER — Emergency Department (HOSPITAL_COMMUNITY): Payer: Medicaid Other

## 2023-01-06 ENCOUNTER — Other Ambulatory Visit: Payer: Self-pay

## 2023-01-06 ENCOUNTER — Encounter (HOSPITAL_COMMUNITY): Payer: Self-pay

## 2023-01-06 DIAGNOSIS — D72829 Elevated white blood cell count, unspecified: Secondary | ICD-10-CM | POA: Insufficient documentation

## 2023-01-06 DIAGNOSIS — Z1152 Encounter for screening for COVID-19: Secondary | ICD-10-CM | POA: Diagnosis not present

## 2023-01-06 DIAGNOSIS — N12 Tubulo-interstitial nephritis, not specified as acute or chronic: Secondary | ICD-10-CM | POA: Diagnosis not present

## 2023-01-06 DIAGNOSIS — R1031 Right lower quadrant pain: Secondary | ICD-10-CM | POA: Diagnosis present

## 2023-01-06 LAB — CBC WITH DIFFERENTIAL/PLATELET
Abs Immature Granulocytes: 0.03 10*3/uL (ref 0.00–0.07)
Basophils Absolute: 0.1 10*3/uL (ref 0.0–0.1)
Basophils Relative: 0 %
Eosinophils Absolute: 0.1 10*3/uL (ref 0.0–0.5)
Eosinophils Relative: 1 %
HCT: 39.8 % (ref 36.0–46.0)
Hemoglobin: 13.5 g/dL (ref 12.0–15.0)
Immature Granulocytes: 0 %
Lymphocytes Relative: 9 %
Lymphs Abs: 1.1 10*3/uL (ref 0.7–4.0)
MCH: 32.3 pg (ref 26.0–34.0)
MCHC: 33.9 g/dL (ref 30.0–36.0)
MCV: 95.2 fL (ref 80.0–100.0)
Monocytes Absolute: 0.6 10*3/uL (ref 0.1–1.0)
Monocytes Relative: 5 %
Neutro Abs: 10.1 10*3/uL — ABNORMAL HIGH (ref 1.7–7.7)
Neutrophils Relative %: 85 %
Platelets: 175 10*3/uL (ref 150–400)
RBC: 4.18 MIL/uL (ref 3.87–5.11)
RDW: 12.4 % (ref 11.5–15.5)
WBC: 12 10*3/uL — ABNORMAL HIGH (ref 4.0–10.5)
nRBC: 0 % (ref 0.0–0.2)

## 2023-01-06 LAB — URINALYSIS, ROUTINE W REFLEX MICROSCOPIC
Bilirubin Urine: NEGATIVE
Glucose, UA: NEGATIVE mg/dL
Ketones, ur: 5 mg/dL — AB
Nitrite: POSITIVE — AB
Protein, ur: 100 mg/dL — AB
RBC / HPF: >50 RBC/hpf (ref 0–5)
Specific Gravity, Urine: 1.009 (ref 1.005–1.030)
WBC, UA: >50 WBC/hpf (ref 0–5)
pH: 7 (ref 5.0–8.0)

## 2023-01-06 LAB — LIPASE, BLOOD: Lipase: 30 U/L (ref 11–51)

## 2023-01-06 LAB — COMPREHENSIVE METABOLIC PANEL
ALT: 13 U/L (ref 0–44)
AST: 15 U/L (ref 15–41)
Albumin: 3.8 g/dL (ref 3.5–5.0)
Alkaline Phosphatase: 52 U/L (ref 38–126)
Anion gap: 9 (ref 5–15)
BUN: 6 mg/dL (ref 6–20)
CO2: 23 mmol/L (ref 22–32)
Calcium: 9.4 mg/dL (ref 8.9–10.3)
Chloride: 105 mmol/L (ref 98–111)
Creatinine, Ser: 0.81 mg/dL (ref 0.44–1.00)
GFR, Estimated: >60 mL/min (ref >60)
Glucose, Bld: 103 mg/dL — ABNORMAL HIGH (ref 70–99)
Potassium: 3.6 mmol/L (ref 3.5–5.1)
Sodium: 137 mmol/L (ref 135–145)
Total Bilirubin: 0.9 mg/dL (ref 0.3–1.2)
Total Protein: 7.2 g/dL (ref 6.5–8.1)

## 2023-01-06 LAB — HCG, SERUM, QUALITATIVE: Preg, Serum: NEGATIVE

## 2023-01-06 LAB — RESP PANEL BY RT-PCR (RSV, FLU A&B, COVID)  RVPGX2
Influenza A by PCR: NEGATIVE
Influenza B by PCR: NEGATIVE
Resp Syncytial Virus by PCR: NEGATIVE
SARS Coronavirus 2 by RT PCR: NEGATIVE

## 2023-01-06 MED ORDER — MORPHINE SULFATE (PF) 4 MG/ML IV SOLN
4.0000 mg | Freq: Once | INTRAVENOUS | Status: AC
  Administered 2023-01-06: 4 mg via INTRAVENOUS
  Filled 2023-01-06: qty 1

## 2023-01-06 MED ORDER — CEFUROXIME AXETIL 500 MG PO TABS: 500.0000 mg | ORAL_TABLET | Freq: Two times a day (BID) | ORAL | 0 refills | Status: AC

## 2023-01-06 MED ORDER — SODIUM CHLORIDE 0.9 % IV BOLUS
1000.0000 mL | Freq: Once | INTRAVENOUS | Status: AC
  Administered 2023-01-06: 1000 mL via INTRAVENOUS

## 2023-01-06 MED ORDER — ONDANSETRON 8 MG PO TBDP: 8.0000 mg | ORAL_TABLET | Freq: Three times a day (TID) | ORAL | 0 refills | Status: DC | PRN

## 2023-01-06 MED ORDER — ONDANSETRON HCL 4 MG/2ML IJ SOLN
4.0000 mg | Freq: Once | INTRAMUSCULAR | Status: AC
  Administered 2023-01-06: 4 mg via INTRAVENOUS
  Filled 2023-01-06: qty 2

## 2023-01-06 MED ORDER — SODIUM CHLORIDE 0.9 % IV SOLN
2.0000 g | Freq: Once | INTRAVENOUS | Status: AC
  Administered 2023-01-06: 2 g via INTRAVENOUS
  Filled 2023-01-06: qty 20

## 2023-01-06 MED ORDER — ONDANSETRON 4 MG PO TBDP
4.0000 mg | ORAL_TABLET | Freq: Once | ORAL | Status: AC | PRN
  Administered 2023-01-06: 4 mg via ORAL
  Filled 2023-01-06: qty 1

## 2023-01-06 MED ORDER — NAPROXEN 375 MG PO TABS: 375.0000 mg | ORAL_TABLET | Freq: Two times a day (BID) | ORAL | 0 refills | Status: DC

## 2023-01-06 NOTE — ED Triage Notes (Signed)
Arrives [facility] from home with nausea, vomiting and generalized abdominal pains that began ~8am yesterday.

## 2023-01-06 NOTE — Discharge Instructions (Signed)
Take the medications as prescribed until they are finished.  Follow-up with a primary care doctor next week to be rechecked to make sure you are improving.  Return to the ER for fevers, persistent vomiting or other concerning symptoms.

## 2023-01-06 NOTE — ED Provider Notes (Signed)
[place] EMERGENCY DEPARTMENT AT [facility] Provider Note   CSN: [id number] Arrival date & time: 01/06/23  0404     History  Chief Complaint  Patient presents with   Vomiting    Hannah Burns is a 32 y.o. female.  HPI   Patient denies any significant medical history.  She presents to the ED with complaints of nausea vomiting abdominal discomfort.  Patient states she started having symptoms yesterday.  Patient states she has had episodes of nausea as well as vomiting.  She is also had abdominal discomfort that is mostly on the right side and now more focused towards her lower abdomen.  She denies any diarrhea.  She states her urination is not really painful but it does feel a bit different.  She is not aware of any fevers.  Symptoms have been progressing since yesterday and she was feeling worse so she came to the ED  Home Medications Prior to Admission medications   Medication Sig Start Date End Date Taking? Authorizing Provider  cefUROXime (CEFTIN) 500 MG tablet Take 1 tablet (500 mg total) by mouth 2 (two) times daily with a meal for 10 days. 01/06/23 01/16/23 Yes Linwood Dibbles, MD  naproxen (NAPROSYN) 375 MG tablet Take 1 tablet (375 mg total) by mouth 2 (two) times daily. 01/06/23  Yes Linwood Dibbles, MD  ondansetron (ZOFRAN-ODT) 8 MG disintegrating tablet Take 1 tablet (8 mg total) by mouth every 8 (eight) hours as needed for nausea or vomiting. 01/06/23  Yes Linwood Dibbles, MD  HYDROcodone-acetaminophen (NORCO/VICODIN) 5-325 MG tablet Take 1 tablet by mouth every 6 (six) hours as needed for severe pain. Patient not taking: Reported on 08/29/2019 08/08/19   Joy, Ines Bloomer C, PA-C  lidocaine (LIDODERM) 5 % Place 1 patch onto the skin daily. Remove & Discard patch within 12 hours or as directed by MD Patient not taking: Reported on 08/29/2019 08/08/19   Joy, Hillard Danker, PA-C  methocarbamol (ROBAXIN) 500 MG tablet Take 1 tablet (500 mg total) by mouth 2 (two) times daily. Patient not taking:  Reported on 08/29/2019 08/08/19   Anselm Pancoast, PA-C      Allergies    Patient has no known allergies.    Review of Systems   Review of Systems  Physical Exam Updated Vital Signs BP (!) 144/84 (BP Location: Right Arm)   Pulse (!) 47   Temp 98.4 F (36.9 C) (Oral)   Resp 18   Ht 1.575 m (5\' 2" )   Wt 95.3 kg   SpO2 100%   BMI 38.41 kg/m  Physical Exam Vitals and nursing note reviewed.  Constitutional:      General: She is not in acute distress.    Appearance: She is well-developed.  HENT:     Head: Normocephalic and atraumatic.     Right Ear: External ear normal.     Left Ear: External ear normal.  Eyes:     General: No scleral icterus.       Right eye: No discharge.        Left eye: No discharge.     Conjunctiva/sclera: Conjunctivae normal.  Neck:     Trachea: No tracheal deviation.  Cardiovascular:     Rate and Rhythm: Normal rate and regular rhythm.  Pulmonary:     Effort: Pulmonary effort is normal. No respiratory distress.     Breath sounds: Normal breath sounds. No stridor. No wheezing or rales.  Abdominal:     General: Bowel sounds are normal. There  is no distension.     Palpations: Abdomen is soft.     Tenderness: There is abdominal tenderness in the right lower quadrant and suprapubic area. There is right CVA tenderness. There is no guarding or rebound.  Musculoskeletal:        General: No tenderness or deformity.     Cervical back: Neck supple.  Skin:    General: Skin is warm and dry.     Findings: No rash.  Neurological:     General: No focal deficit present.     Mental Status: She is alert.     Cranial Nerves: No cranial nerve deficit, dysarthria or facial asymmetry.     Sensory: No sensory deficit.     Motor: No abnormal muscle tone or seizure activity.     Coordination: Coordination normal.  Psychiatric:        Mood and Affect: Mood normal.     ED Results / Procedures / Treatments   Labs (all labs ordered are listed, but only abnormal results  are displayed) Labs Reviewed  COMPREHENSIVE METABOLIC PANEL - Abnormal; Notable for the following components:      Result Value   Glucose, Bld 103 (*)    All other components within normal limits  URINALYSIS, ROUTINE W REFLEX MICROSCOPIC - Abnormal; Notable for the following components:   APPearance CLOUDY (*)    Hgb urine dipstick LARGE (*)    Ketones, ur 5 (*)    Protein, ur 100 (*)    Nitrite POSITIVE (*)    Leukocytes,Ua LARGE (*)    Bacteria, UA MANY (*)    Non Squamous Epithelial 0-5 (*)    All other components within normal limits  CBC WITH DIFFERENTIAL/PLATELET - Abnormal; Notable for the following components:   WBC 12.0 (*)    Neutro Abs 10.1 (*)    All other components within normal limits  RESP PANEL BY RT-PCR (RSV, FLU A&B, COVID)  [id number]  URINE CULTURE  LIPASE, BLOOD  HCG, SERUM, QUALITATIVE    EKG None  Radiology CT Renal Stone Study  Result Date: 01/06/2023 CLINICAL DATA:  Flank pain EXAM: CT ABDOMEN AND PELVIS WITHOUT CONTRAST TECHNIQUE: Multidetector CT imaging of the abdomen and pelvis was performed following the standard protocol without IV contrast. RADIATION DOSE REDUCTION: This exam was performed according to the departmental dose-optimization program which includes automated exposure control, adjustment of the mA and/or kV according to patient size and/or use of iterative reconstruction technique. COMPARISON:  CT 02/06/2015 FINDINGS: Lower chest: No acute abnormality. Hepatobiliary: No focal liver abnormality is seen. No gallstones, gallbladder wall thickening, or biliary dilatation. Pancreas: Unremarkable. No pancreatic ductal dilatation or surrounding inflammatory changes. Spleen: Normal in size without focal abnormality. Adrenals/Urinary Tract: No renal stones identified. No collecting system dilatation. The ureters somewhat difficult to follow along their course. There are small calcifications in the pelvis. These are not clearly along the course of either  ureter. If needed a follow up study with contrast and delayed imaging to assess for the exact location of the uterus could be performed. The bladder is underdistended. There is some mild stranding along the course of the right ureter. Stomach/Bowel: On this non oral contrast exam large bowel has a normal course and caliber. Scattered stool. Stomach and small bowel are nondilated. Vascular/Lymphatic: Aortic atherosclerosis. No enlarged abdominal or pelvic lymph nodes. Reproductive: Uterus and bilateral adnexa are unremarkable. Other: No free air or free fluid. Musculoskeletal: Mild disc height loss at L5-S1 with some disc bulging. IMPRESSION: No  bowel obstruction.  Normal appendix. Slight stranding along the course of the right ureter but no definite confirmed renal stone or collecting system dilatation. There are some calcifications in the pelvis which could be vascular but are near the course of the right ureter. The ureters are somewhat difficult to follow along their course in the pelvis. If there is concern of a nonobstructing renal stone a contrast study could be considered with delayed imaging to confirm the course of the right ureter. Please correlate with patient's urinalysis. Electronically Signed   By: Karen Kays M.D.   On: 01/06/2023 10:31    Procedures Procedures    Medications Ordered in ED Medications  ondansetron (ZOFRAN-ODT) disintegrating tablet 4 mg (4 mg Oral Given 01/06/23 0441)  sodium chloride 0.9 % bolus 1,000 mL (1,000 mLs Intravenous New Bag/Given 01/06/23 0947)  ondansetron (ZOFRAN) injection 4 mg (4 mg Intravenous Given 01/06/23 0946)  morphine (PF) 4 MG/ML injection 4 mg (4 mg Intravenous Given 01/06/23 0946)  cefTRIAXone (ROCEPHIN) 2 g in sodium chloride 0.9 % 100 mL IVPB (2 g Intravenous New Bag/Given 01/06/23 0947)    ED Course/ Medical Decision Making/ A&P Clinical Course as of 01/06/23 1112  Wed Jan 06, 2023  0928 CBC with Differential(!) CBC shows leukocytosis.   Metabolic panel without signs of liver abnormalities or severe dehydration.  Pregnancy test is negative.  Urinalysis is suggestive of infection with pyuria and hematuria many bacteria [JK]  1049 No obvious signs of kidney stone although somewhat limited per radiologist [JK]    Clinical Course User Index [JK] Linwood Dibbles, MD                                 Medical Decision Making Problems Addressed: Pyelonephritis: acute illness or injury that poses a threat to life or bodily functions  Amount and/or Complexity of Data Reviewed Labs: ordered. Decision-making details documented in ED Course. Radiology: ordered and independent interpretation performed.  Risk Prescription drug management. Parenteral controlled substances. Decision regarding hospitalization.   Patient presented with complaints of abdominal pain vomiting.  Patient had noted some urinary symptoms.  Labs notable for leukocytosis.  Otherwise no evidence of hepatitis or pancreatitis.  Urinalysis was consistent with urinary tract infection with many bacteria and pyuria, positive nitrite and leukocyte Estrace.  Patient was treated with IV fluids pain medications and antiemetics.  With the hematuria CT scan was performed to rule out obstructing ureteral stone.  No definite signs of kidney stone on CT scan although radiologist commented on somewhat limited study.  Patient improved with treatment in the ED.  No persistent vomiting.  Do not feel that she requires inpatient treatment at this time.  Will discharge home with course of antibiotics antiemetic.  Warning signs precautions discussed.        Final Clinical Impression(s) / ED Diagnoses Final diagnoses:  Pyelonephritis    Rx / DC Orders ED Discharge Orders          Ordered    cefUROXime (CEFTIN) 500 MG tablet  2 times daily with meals        01/06/23 1109    ondansetron (ZOFRAN-ODT) 8 MG disintegrating tablet  Every 8 hours PRN        01/06/23 1109    naproxen  (NAPROSYN) 375 MG tablet  2 times daily        01/06/23 1109  Linwood Dibbles, MD 01/06/23 [phone]

## 2023-01-09 ENCOUNTER — Telehealth (HOSPITAL_BASED_OUTPATIENT_CLINIC_OR_DEPARTMENT_OTHER): Payer: Self-pay | Admitting: *Deleted

## 2023-01-09 NOTE — Telephone Encounter (Signed)
Post ED Visit - Positive Culture Follow-up  Culture report reviewed by antimicrobial stewardship pharmacist: [facility] []  [address], Pharm.D. []  Celedonio Miyamoto, Pharm.D., BCPS AQ-ID []  Garvin Fila, Pharm.D., BCPS []  Georgina Pillion, Pharm.D., BCPS []  [place], [place].D., BCPS, AAHIVP []  Estella Husk, Pharm.D., BCPS, AAHIVP []  Lysle Pearl, PharmD, BCPS []  Phillips Climes, PharmD, BCPS []  Agapito Games, PharmD, BCPS []  Verlan Friends, PharmD []  Mervyn Gay, PharmD, BCPS []  Vinnie Level, PharmD  [facility] []  Len Childs, PharmD []  Greer Pickerel, PharmD []  Adalberto Cole, PharmD []  Perlie Gold, Rph []  Lonell Face) Jean Rosenthal, PharmD []  Earl Many, PharmD []  Junita Push, PharmD []  Dorna Leitz, PharmD []  Terrilee Files, PharmD []  Lynann Beaver, PharmD []  Keturah Barre, PharmD []  Loralee Pacas, PharmD [x]  Bernadene Person, PharmD   Positive urine culture Treated with Cefuroxime Axetil, organism sensitive to the same and no further patient follow-up is required at this time.  Patsey Berthold 01/09/2023, 3:26 PM

## 2023-01-24 ENCOUNTER — Inpatient Hospital Stay (HOSPITAL_COMMUNITY)
Admission: EM | Admit: 2023-01-24 | Discharge: 2023-01-28 | DRG: 516 | Disposition: A | Discharge status: Rehabilitation Facility | Payer: Medicaid Other | Attending: General Surgery | Admitting: General Surgery

## 2023-01-24 ENCOUNTER — Other Ambulatory Visit: Payer: Self-pay

## 2023-01-24 ENCOUNTER — Encounter (HOSPITAL_COMMUNITY): Payer: Self-pay

## 2023-01-24 ENCOUNTER — Inpatient Hospital Stay (HOSPITAL_COMMUNITY): Payer: Medicaid Other

## 2023-01-24 ENCOUNTER — Emergency Department (HOSPITAL_COMMUNITY): Payer: Medicaid Other

## 2023-01-24 DIAGNOSIS — Y9301 Activity, walking, marching and hiking: Secondary | ICD-10-CM | POA: Diagnosis present

## 2023-01-24 DIAGNOSIS — S32421D Displaced fracture of posterior wall of right acetabulum, subsequent encounter for fracture with routine healing: Secondary | ICD-10-CM | POA: Diagnosis not present

## 2023-01-24 DIAGNOSIS — R609 Edema, unspecified: Secondary | ICD-10-CM | POA: Diagnosis not present

## 2023-01-24 DIAGNOSIS — D62 Acute posthemorrhagic anemia: Secondary | ICD-10-CM | POA: Diagnosis not present

## 2023-01-24 DIAGNOSIS — E669 Obesity, unspecified: Secondary | ICD-10-CM | POA: Diagnosis not present

## 2023-01-24 DIAGNOSIS — S329XXD Fracture of unspecified parts of lumbosacral spine and pelvis, subsequent encounter for fracture with routine healing: Secondary | ICD-10-CM | POA: Diagnosis not present

## 2023-01-24 DIAGNOSIS — F1210 Cannabis abuse, uncomplicated: Secondary | ICD-10-CM | POA: Diagnosis present

## 2023-01-24 DIAGNOSIS — S32421A Displaced fracture of posterior wall of right acetabulum, initial encounter for closed fracture: Secondary | ICD-10-CM | POA: Diagnosis not present

## 2023-01-24 DIAGNOSIS — Z23 Encounter for immunization: Secondary | ICD-10-CM | POA: Diagnosis not present

## 2023-01-24 DIAGNOSIS — S32411A Displaced fracture of anterior wall of right acetabulum, initial encounter for closed fracture: Secondary | ICD-10-CM | POA: Diagnosis present

## 2023-01-24 DIAGNOSIS — E559 Vitamin D deficiency, unspecified: Secondary | ICD-10-CM | POA: Diagnosis present

## 2023-01-24 DIAGNOSIS — Z833 Family history of diabetes mellitus: Secondary | ICD-10-CM

## 2023-01-24 DIAGNOSIS — F17210 Nicotine dependence, cigarettes, uncomplicated: Secondary | ICD-10-CM | POA: Diagnosis present

## 2023-01-24 DIAGNOSIS — R400 Somnolence: Secondary | ICD-10-CM | POA: Diagnosis present

## 2023-01-24 DIAGNOSIS — S32811A Multiple fractures of pelvis with unstable disruption of pelvic ring, initial encounter for closed fracture: Principal | ICD-10-CM | POA: Diagnosis present

## 2023-01-24 DIAGNOSIS — Y92410 Unspecified street and highway as the place of occurrence of the external cause: Secondary | ICD-10-CM

## 2023-01-24 DIAGNOSIS — K5901 Slow transit constipation: Secondary | ICD-10-CM | POA: Diagnosis not present

## 2023-01-24 DIAGNOSIS — Z6834 Body mass index (BMI) 34.0-34.9, adult: Secondary | ICD-10-CM | POA: Diagnosis not present

## 2023-01-24 DIAGNOSIS — S3210XA Unspecified fracture of sacrum, initial encounter for closed fracture: Secondary | ICD-10-CM | POA: Diagnosis not present

## 2023-01-24 DIAGNOSIS — R52 Pain, unspecified: Secondary | ICD-10-CM | POA: Diagnosis not present

## 2023-01-24 DIAGNOSIS — S32421S Displaced fracture of posterior wall of right acetabulum, sequela: Secondary | ICD-10-CM | POA: Diagnosis not present

## 2023-01-24 DIAGNOSIS — S329XXA Fracture of unspecified parts of lumbosacral spine and pelvis, initial encounter for closed fracture: Principal | ICD-10-CM | POA: Diagnosis present

## 2023-01-24 DIAGNOSIS — S32511A Fracture of superior rim of right pubis, initial encounter for closed fracture: Principal | ICD-10-CM

## 2023-01-24 DIAGNOSIS — F1910 Other psychoactive substance abuse, uncomplicated: Secondary | ICD-10-CM | POA: Diagnosis not present

## 2023-01-24 LAB — CBC
HCT: 45.4 % (ref 36.0–46.0)
HCT: 42.2 % (ref 36.0–46.0)
Hemoglobin: 15.3 g/dL — ABNORMAL HIGH (ref 12.0–15.0)
Hemoglobin: 14.3 g/dL (ref 12.0–15.0)
MCH: 32.3 pg (ref 26.0–34.0)
MCH: 31.7 pg (ref 26.0–34.0)
MCHC: 33.7 g/dL (ref 30.0–36.0)
MCHC: 33.9 g/dL (ref 30.0–36.0)
MCV: 95.8 fL (ref 80.0–100.0)
MCV: 93.6 fL (ref 80.0–100.0)
Platelets: 209 10*3/uL (ref 150–400)
Platelets: 182 10*3/uL (ref 150–400)
RBC: 4.74 MIL/uL (ref 3.87–5.11)
RBC: 4.51 MIL/uL (ref 3.87–5.11)
RDW: 11.9 % (ref 11.5–15.5)
RDW: 11.9 % (ref 11.5–15.5)
WBC: 9.3 10*3/uL (ref 4.0–10.5)
WBC: 13.6 10*3/uL — ABNORMAL HIGH (ref 4.0–10.5)
nRBC: 0 % (ref 0.0–0.2)
nRBC: 0 % (ref 0.0–0.2)

## 2023-01-24 LAB — ETHANOL: Alcohol, Ethyl (B): <10 mg/dL (ref <10)

## 2023-01-24 LAB — I-STAT CHEM 8, ED
BUN: 11 mg/dL (ref 6–20)
Calcium, Ion: 1.12 mmol/L — ABNORMAL LOW (ref 1.15–1.40)
Chloride: 106 mmol/L (ref 98–111)
Creatinine, Ser: 0.8 mg/dL (ref 0.44–1.00)
Glucose, Bld: 106 mg/dL — ABNORMAL HIGH (ref 70–99)
HCT: 45 % (ref 36.0–46.0)
Hemoglobin: 15.3 g/dL — ABNORMAL HIGH (ref 12.0–15.0)
Potassium: 4.1 mmol/L (ref 3.5–5.1)
Sodium: 139 mmol/L (ref 135–145)
TCO2: 24 mmol/L (ref 22–32)

## 2023-01-24 LAB — COMPREHENSIVE METABOLIC PANEL
ALT: 22 U/L (ref 0–44)
AST: 24 U/L (ref 15–41)
Albumin: 3.9 g/dL (ref 3.5–5.0)
Alkaline Phosphatase: 63 U/L (ref 38–126)
Anion gap: 11 (ref 5–15)
BUN: 10 mg/dL (ref 6–20)
CO2: 21 mmol/L — ABNORMAL LOW (ref 22–32)
Calcium: 9.3 mg/dL (ref 8.9–10.3)
Chloride: 105 mmol/L (ref 98–111)
Creatinine, Ser: 0.87 mg/dL (ref 0.44–1.00)
GFR, Estimated: >60 mL/min (ref >60)
Glucose, Bld: 122 mg/dL — ABNORMAL HIGH (ref 70–99)
Potassium: 3.6 mmol/L (ref 3.5–5.1)
Sodium: 137 mmol/L (ref 135–145)
Total Bilirubin: 0.9 mg/dL (ref 0.3–1.2)
Total Protein: 7.9 g/dL (ref 6.5–8.1)

## 2023-01-24 LAB — URINALYSIS, ROUTINE W REFLEX MICROSCOPIC
Bilirubin Urine: NEGATIVE
Glucose, UA: NEGATIVE mg/dL
Hgb urine dipstick: NEGATIVE
Ketones, ur: NEGATIVE mg/dL
Nitrite: NEGATIVE
Protein, ur: NEGATIVE mg/dL
Specific Gravity, Urine: 1.035 — ABNORMAL HIGH (ref 1.005–1.030)
pH: 6 (ref 5.0–8.0)

## 2023-01-24 LAB — RAPID URINE DRUG SCREEN, HOSP PERFORMED
Amphetamines: NOT DETECTED
Barbiturates: NOT DETECTED
Benzodiazepines: NOT DETECTED
Cocaine: POSITIVE — AB
Opiates: NOT DETECTED
Tetrahydrocannabinol: POSITIVE — AB

## 2023-01-24 LAB — I-STAT CG4 LACTIC ACID, ED: Lactic Acid, Venous: 1.5 mmol/L (ref 0.5–1.9)

## 2023-01-24 LAB — CREATININE, SERUM
Creatinine, Ser: 0.77 mg/dL (ref 0.44–1.00)
GFR, Estimated: >60 mL/min (ref >60)

## 2023-01-24 LAB — PROTIME-INR
INR: 1.1 (ref 0.8–1.2)
Prothrombin Time: 14.6 seconds (ref 11.4–15.2)

## 2023-01-24 LAB — SAMPLE TO BLOOD BANK

## 2023-01-24 LAB — HCG, SERUM, QUALITATIVE: Preg, Serum: NEGATIVE

## 2023-01-24 MED ORDER — FENTANYL CITRATE PF 50 MCG/ML IJ SOSY
50.0000 ug | PREFILLED_SYRINGE | Freq: Once | INTRAMUSCULAR | Status: AC
  Administered 2023-01-24: 50 ug via INTRAVENOUS
  Filled 2023-01-24: qty 1

## 2023-01-24 MED ORDER — HYDROMORPHONE HCL 1 MG/ML IJ SOLN
0.5000 mg | INTRAMUSCULAR | Status: DC | PRN
  Administered 2023-01-24 – 2023-01-26 (×5): 0.5 mg via INTRAVENOUS
  Filled 2023-01-24 (×6): qty 0.5

## 2023-01-24 MED ORDER — HYDROMORPHONE HCL 1 MG/ML IJ SOLN
0.5000 mg | Freq: Once | INTRAMUSCULAR | Status: AC
  Administered 2023-01-24: 0.5 mg via INTRAVENOUS
  Filled 2023-01-24: qty 1

## 2023-01-24 MED ORDER — ONDANSETRON HCL 4 MG/2ML IJ SOLN
4.0000 mg | Freq: Four times a day (QID) | INTRAMUSCULAR | Status: DC | PRN
  Administered 2023-01-27: 4 mg via INTRAVENOUS
  Filled 2023-01-24: qty 2

## 2023-01-24 MED ORDER — METOPROLOL TARTRATE 5 MG/5ML IV SOLN: 5.0000 mg | Freq: Four times a day (QID) | INTRAVENOUS | Status: DC | PRN

## 2023-01-24 MED ORDER — DOCUSATE SODIUM 100 MG PO CAPS
100.0000 mg | ORAL_CAPSULE | Freq: Two times a day (BID) | ORAL | Status: DC
  Administered 2023-01-24 – 2023-01-28 (×6): 100 mg via ORAL
  Filled 2023-01-24 (×7): qty 1

## 2023-01-24 MED ORDER — TETANUS-DIPHTH-ACELL PERTUSSIS 5-2.5-18.5 LF-MCG/0.5 IM SUSY
0.5000 mL | PREFILLED_SYRINGE | Freq: Once | INTRAMUSCULAR | Status: AC
  Administered 2023-01-24: 0.5 mL via INTRAMUSCULAR
  Filled 2023-01-24: qty 0.5

## 2023-01-24 MED ORDER — METHOCARBAMOL 1000 MG/10ML IJ SOLN
500.0000 mg | Freq: Three times a day (TID) | INTRAVENOUS | Status: DC
  Administered 2023-01-25: 500 mg via INTRAVENOUS
  Filled 2023-01-24: qty 500

## 2023-01-24 MED ORDER — IBUPROFEN 200 MG PO TABS
600.0000 mg | ORAL_TABLET | Freq: Four times a day (QID) | ORAL | Status: DC | PRN
  Administered 2023-01-24: 600 mg via ORAL
  Filled 2023-01-24: qty 3

## 2023-01-24 MED ORDER — HYDRALAZINE HCL 20 MG/ML IJ SOLN: 10.0000 mg | INTRAMUSCULAR | Status: DC | PRN

## 2023-01-24 MED ORDER — LACTATED RINGERS IV SOLN: INTRAVENOUS | Status: DC

## 2023-01-24 MED ORDER — ACETAMINOPHEN 500 MG PO TABS
1000.0000 mg | ORAL_TABLET | Freq: Four times a day (QID) | ORAL | Status: DC
  Administered 2023-01-24 – 2023-01-28 (×14): 1000 mg via ORAL
  Filled 2023-01-24 (×15): qty 2

## 2023-01-24 MED ORDER — IOHEXOL 350 MG/ML SOLN
75.0000 mL | Freq: Once | INTRAVENOUS | Status: AC | PRN
  Administered 2023-01-24: 75 mL via INTRAVENOUS

## 2023-01-24 MED ORDER — ONDANSETRON 4 MG PO TBDP: 4.0000 mg | ORAL_TABLET | Freq: Four times a day (QID) | ORAL | Status: DC | PRN

## 2023-01-24 MED ORDER — METHOCARBAMOL 500 MG PO TABS
500.0000 mg | ORAL_TABLET | Freq: Three times a day (TID) | ORAL | Status: DC
  Administered 2023-01-24 – 2023-01-27 (×7): 500 mg via ORAL
  Filled 2023-01-24 (×7): qty 1

## 2023-01-24 MED ORDER — POLYETHYLENE GLYCOL 3350 17 G PO PACK: 17.0000 g | PACK | Freq: Every day | ORAL | Status: DC | PRN

## 2023-01-24 MED ORDER — ENOXAPARIN SODIUM 30 MG/0.3ML IJ SOSY: 30.0000 mg | PREFILLED_SYRINGE | Freq: Two times a day (BID) | INTRAMUSCULAR | Status: DC

## 2023-01-24 MED ORDER — HYDROMORPHONE HCL 1 MG/ML IJ SOLN
1.0000 mg | Freq: Once | INTRAMUSCULAR | Status: AC
  Administered 2023-01-24: 1 mg via INTRAVENOUS
  Filled 2023-01-24: qty 1

## 2023-01-24 MED ORDER — OXYCODONE HCL 5 MG PO TABS
5.0000 mg | ORAL_TABLET | Freq: Four times a day (QID) | ORAL | Status: DC | PRN
  Administered 2023-01-24 – 2023-01-26 (×6): 10 mg via ORAL
  Filled 2023-01-24 (×8): qty 2

## 2023-01-24 NOTE — ED Notes (Signed)
RN removed c-collar
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
## 2023-01-24 NOTE — TOC CAGE-AID Note (Signed)
Transition of Care [facility]) - CAGE-AID Screening   Patient Details  Name: Hannah Burns MRN: [id number] Date of Birth: [birth year]  Transition of Care [facility]) CM/SW Contact:    Katha Hamming, RN Phone Number: 01/24/2023, 10:15 PM    CAGE-AID Screening:    Have You Ever Felt You Ought to Cut Down on Your Drinking or Drug Use?: No Have People Annoyed You By Critizing Your Drinking Or Drug Use?: No Have You Felt Bad Or Guilty About Your Drinking Or Drug Use?: No Have You Ever Had a Drink or Used Drugs First Thing In The Morning to Steady Your Nerves or to Get Rid of a Hangover?: No CAGE-AID Score: 0  Substance Abuse Education Offered: No
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
## 2023-01-25 ENCOUNTER — Inpatient Hospital Stay (HOSPITAL_COMMUNITY): Payer: Medicaid Other

## 2023-01-25 ENCOUNTER — Other Ambulatory Visit: Payer: Self-pay

## 2023-01-25 ENCOUNTER — Encounter (HOSPITAL_COMMUNITY): Admission: EM | Disposition: A | Discharge status: Still In Hospital | Payer: Self-pay | Source: Home / Self Care

## 2023-01-25 DIAGNOSIS — Z6834 Body mass index (BMI) 34.0-34.9, adult: Secondary | ICD-10-CM | POA: Diagnosis not present

## 2023-01-25 DIAGNOSIS — E669 Obesity, unspecified: Secondary | ICD-10-CM | POA: Diagnosis not present

## 2023-01-25 DIAGNOSIS — S3210XA Unspecified fracture of sacrum, initial encounter for closed fracture: Secondary | ICD-10-CM | POA: Diagnosis not present

## 2023-01-25 HISTORY — PX: PERCUTANEOUS PINNING: SHX2209

## 2023-01-25 LAB — SURGICAL PCR SCREEN
MRSA, PCR: NEGATIVE
Staphylococcus aureus: NEGATIVE

## 2023-01-25 LAB — CBC
HCT: 37.6 % (ref 36.0–46.0)
Hemoglobin: 12.6 g/dL (ref 12.0–15.0)
MCH: 31.6 pg (ref 26.0–34.0)
MCHC: 33.5 g/dL (ref 30.0–36.0)
MCV: 94.2 fL (ref 80.0–100.0)
Platelets: 157 10*3/uL (ref 150–400)
RBC: 3.99 MIL/uL (ref 3.87–5.11)
RDW: 11.9 % (ref 11.5–15.5)
WBC: 7.8 10*3/uL (ref 4.0–10.5)
nRBC: 0 % (ref 0.0–0.2)

## 2023-01-25 LAB — BASIC METABOLIC PANEL
Anion gap: 9 (ref 5–15)
BUN: 8 mg/dL (ref 6–20)
CO2: 24 mmol/L (ref 22–32)
Calcium: 8.5 mg/dL — ABNORMAL LOW (ref 8.9–10.3)
Chloride: 101 mmol/L (ref 98–111)
Creatinine, Ser: 0.69 mg/dL (ref 0.44–1.00)
GFR, Estimated: >60 mL/min (ref >60)
Glucose, Bld: 109 mg/dL — ABNORMAL HIGH (ref 70–99)
Potassium: 3.1 mmol/L — ABNORMAL LOW (ref 3.5–5.1)
Sodium: 134 mmol/L — ABNORMAL LOW (ref 135–145)

## 2023-01-25 SURGERY — PINNING, EXTREMITY, PERCUTANEOUS
Anesthesia: General | Site: Hip | Laterality: Left

## 2023-01-25 MED ORDER — SUGAMMADEX SODIUM 200 MG/2ML IV SOLN
INTRAVENOUS | Status: DC | PRN
  Administered 2023-01-25: 200 mg via INTRAVENOUS

## 2023-01-25 MED ORDER — KETOROLAC TROMETHAMINE 30 MG/ML IJ SOLN
INTRAMUSCULAR | Status: AC
  Filled 2023-01-25: qty 1

## 2023-01-25 MED ORDER — ONDANSETRON HCL 4 MG/2ML IJ SOLN
INTRAMUSCULAR | Status: AC
  Filled 2023-01-25: qty 2

## 2023-01-25 MED ORDER — SCOPOLAMINE 1 MG/3DAYS TD PT72: 1.0000 | MEDICATED_PATCH | TRANSDERMAL | Status: DC

## 2023-01-25 MED ORDER — DEXAMETHASONE SODIUM PHOSPHATE 10 MG/ML IJ SOLN
INTRAMUSCULAR | Status: AC
  Filled 2023-01-25: qty 1

## 2023-01-25 MED ORDER — PROPOFOL 10 MG/ML IV BOLUS
INTRAVENOUS | Status: AC
  Filled 2023-01-25: qty 20

## 2023-01-25 MED ORDER — OXYCODONE HCL 5 MG/5ML PO SOLN: 5.0000 mg | Freq: Once | ORAL | Status: DC | PRN

## 2023-01-25 MED ORDER — DEXAMETHASONE SODIUM PHOSPHATE 10 MG/ML IJ SOLN
INTRAMUSCULAR | Status: DC | PRN
  Administered 2023-01-25: 10 mg via INTRAVENOUS

## 2023-01-25 MED ORDER — POTASSIUM CHLORIDE 20 MEQ PO PACK
40.0000 meq | PACK | Freq: Once | ORAL | Status: AC
  Administered 2023-01-25: 40 meq via ORAL
  Filled 2023-01-25: qty 2

## 2023-01-25 MED ORDER — ONDANSETRON HCL 4 MG/2ML IJ SOLN
INTRAMUSCULAR | Status: DC | PRN
Start: 2023-01-25 — End: 2023-01-25
  Administered 2023-01-25: 4 mg via INTRAVENOUS

## 2023-01-25 MED ORDER — LIDOCAINE 2% (20 MG/ML) 5 ML SYRINGE
INTRAMUSCULAR | Status: AC
  Filled 2023-01-25: qty 5

## 2023-01-25 MED ORDER — ROCURONIUM BROMIDE 10 MG/ML (PF) SYRINGE
PREFILLED_SYRINGE | INTRAVENOUS | Status: DC | PRN
  Administered 2023-01-25: 60 mg via INTRAVENOUS

## 2023-01-25 MED ORDER — CEFAZOLIN SODIUM-DEXTROSE 2-4 GM/100ML-% IV SOLN
INTRAVENOUS | Status: AC
  Filled 2023-01-25: qty 100

## 2023-01-25 MED ORDER — NEOSTIGMINE METHYLSULFATE 3 MG/3ML IV SOSY
PREFILLED_SYRINGE | INTRAVENOUS | Status: AC
  Filled 2023-01-25: qty 3

## 2023-01-25 MED ORDER — ONDANSETRON HCL 4 MG/2ML IJ SOLN: 4.0000 mg | Freq: Once | INTRAMUSCULAR | Status: DC | PRN

## 2023-01-25 MED ORDER — ROCURONIUM BROMIDE 10 MG/ML (PF) SYRINGE
PREFILLED_SYRINGE | INTRAVENOUS | Status: AC
  Filled 2023-01-25: qty 10

## 2023-01-25 MED ORDER — ENOXAPARIN SODIUM 30 MG/0.3ML IJ SOSY
30.0000 mg | PREFILLED_SYRINGE | Freq: Two times a day (BID) | INTRAMUSCULAR | Status: DC
  Administered 2023-01-26 – 2023-01-28 (×5): 30 mg via SUBCUTANEOUS
  Filled 2023-01-25 (×5): qty 0.3

## 2023-01-25 MED ORDER — HYDROMORPHONE HCL 1 MG/ML IJ SOLN
INTRAMUSCULAR | Status: AC
  Filled 2023-01-25: qty 1

## 2023-01-25 MED ORDER — MIDAZOLAM HCL 2 MG/2ML IJ SOLN
INTRAMUSCULAR | Status: DC | PRN
  Administered 2023-01-25: 2 mg via INTRAVENOUS

## 2023-01-25 MED ORDER — OXYCODONE HCL 5 MG PO TABS: 5.0000 mg | ORAL_TABLET | Freq: Once | ORAL | Status: DC | PRN

## 2023-01-25 MED ORDER — KETOROLAC TROMETHAMINE 30 MG/ML IJ SOLN
30.0000 mg | Freq: Once | INTRAMUSCULAR | Status: AC | PRN
  Administered 2023-01-25: 30 mg via INTRAVENOUS

## 2023-01-25 MED ORDER — SUCCINYLCHOLINE CHLORIDE 200 MG/10ML IV SOSY
PREFILLED_SYRINGE | INTRAVENOUS | Status: AC
  Filled 2023-01-25: qty 10

## 2023-01-25 MED ORDER — CHLORHEXIDINE GLUCONATE 0.12 % MT SOLN: 15.0000 mL | Freq: Once | OROMUCOSAL | Status: DC

## 2023-01-25 MED ORDER — ARTIFICIAL TEARS OPHTHALMIC OINT
TOPICAL_OINTMENT | OPHTHALMIC | Status: AC
  Filled 2023-01-25: qty 3.5

## 2023-01-25 MED ORDER — CEFAZOLIN SODIUM-DEXTROSE 2-4 GM/100ML-% IV SOLN
2.0000 g | Freq: Once | INTRAVENOUS | Status: AC
  Administered 2023-01-25: 2 g via INTRAVENOUS

## 2023-01-25 MED ORDER — EPHEDRINE 5 MG/ML INJ
INTRAVENOUS | Status: AC
  Filled 2023-01-25: qty 5

## 2023-01-25 MED ORDER — 0.9 % SODIUM CHLORIDE (POUR BTL) OPTIME
TOPICAL | Status: DC | PRN
  Administered 2023-01-25: 1000 mL

## 2023-01-25 MED ORDER — SODIUM CHLORIDE (PF) 0.9 % IJ SOLN
INTRAMUSCULAR | Status: AC
  Filled 2023-01-25: qty 10

## 2023-01-25 MED ORDER — GLYCOPYRROLATE PF 0.2 MG/ML IJ SOSY
PREFILLED_SYRINGE | INTRAMUSCULAR | Status: AC
  Filled 2023-01-25: qty 1

## 2023-01-25 MED ORDER — LIDOCAINE 2% (20 MG/ML) 5 ML SYRINGE
INTRAMUSCULAR | Status: DC | PRN
  Administered 2023-01-25: 60 mg via INTRAVENOUS

## 2023-01-25 MED ORDER — FENTANYL CITRATE (PF) 250 MCG/5ML IJ SOLN
INTRAMUSCULAR | Status: AC
  Filled 2023-01-25: qty 5

## 2023-01-25 MED ORDER — CHLORHEXIDINE GLUCONATE 0.12 % MT SOLN
OROMUCOSAL | Status: AC
  Administered 2023-01-25: 15 mL via OROMUCOSAL
  Filled 2023-01-25: qty 15

## 2023-01-25 MED ORDER — CEFAZOLIN SODIUM-DEXTROSE 2-4 GM/100ML-% IV SOLN
2.0000 g | Freq: Three times a day (TID) | INTRAVENOUS | Status: AC
  Administered 2023-01-25 – 2023-01-26 (×3): 2 g via INTRAVENOUS
  Filled 2023-01-25 (×3): qty 100

## 2023-01-25 MED ORDER — MEPERIDINE HCL 25 MG/ML IJ SOLN: 6.2500 mg | INTRAMUSCULAR | Status: DC | PRN

## 2023-01-25 MED ORDER — FENTANYL CITRATE (PF) 250 MCG/5ML IJ SOLN
INTRAMUSCULAR | Status: DC | PRN
  Administered 2023-01-25 (×5): 50 ug via INTRAVENOUS

## 2023-01-25 MED ORDER — LACTATED RINGERS IV SOLN: INTRAVENOUS | Status: DC

## 2023-01-25 MED ORDER — MIDAZOLAM HCL 2 MG/2ML IJ SOLN
INTRAMUSCULAR | Status: AC
  Filled 2023-01-25: qty 2

## 2023-01-25 MED ORDER — SCOPOLAMINE 1 MG/3DAYS TD PT72
MEDICATED_PATCH | TRANSDERMAL | Status: AC
  Administered 2023-01-25: 1.5 mg via TRANSDERMAL
  Filled 2023-01-25: qty 1

## 2023-01-25 MED ORDER — ACETAMINOPHEN 500 MG PO TABS: 1000.0000 mg | ORAL_TABLET | Freq: Once | ORAL | Status: DC

## 2023-01-25 MED ORDER — ORAL CARE MOUTH RINSE: 15.0000 mL | Freq: Once | OROMUCOSAL | Status: DC

## 2023-01-25 MED ORDER — PROPOFOL 10 MG/ML IV BOLUS
INTRAVENOUS | Status: DC | PRN
  Administered 2023-01-25: 200 mg via INTRAVENOUS

## 2023-01-25 MED ORDER — HYDROMORPHONE HCL 1 MG/ML IJ SOLN
0.2500 mg | INTRAMUSCULAR | Status: DC | PRN
  Administered 2023-01-25 (×3): 0.5 mg via INTRAVENOUS

## 2023-01-25 MED ORDER — POTASSIUM CHLORIDE CRYS ER 20 MEQ PO TBCR
40.0000 meq | EXTENDED_RELEASE_TABLET | Freq: Once | ORAL | Status: AC
  Administered 2023-01-25: 40 meq via ORAL
  Filled 2023-01-25: qty 2

## 2023-01-25 MED ORDER — AMISULPRIDE (ANTIEMETIC) 5 MG/2ML IV SOLN: 10.0000 mg | Freq: Once | INTRAVENOUS | Status: DC | PRN

## 2023-01-25 MED ORDER — PHENYLEPHRINE 80 MCG/ML (10ML) SYRINGE FOR IV PUSH (FOR BLOOD PRESSURE SUPPORT)
PREFILLED_SYRINGE | INTRAVENOUS | Status: AC
  Filled 2023-01-25: qty 10

## 2023-01-25 SURGICAL SUPPLY — 39 items
BAG COUNTER SPONGE SURGICOUNT (BAG) ×1 IMPLANT
BAG SPNG CNTER NS LX DISP (BAG) ×1
BIT DRILL 4.8X300 (BIT) IMPLANT
BRUSH SCRUB EZ PLAIN DRY (MISCELLANEOUS) ×2 IMPLANT
COVER SURGICAL LIGHT HANDLE (MISCELLANEOUS) ×1 IMPLANT
DRAPE C-ARM 42X72 X-RAY (DRAPES) IMPLANT
DRAPE C-ARMOR (DRAPES) ×1 IMPLANT
DRAPE INCISE IOBAN 66X45 STRL (DRAPES) ×1 IMPLANT
DRAPE LAPAROTOMY TRNSV 102X78 (DRAPES) ×1 IMPLANT
DRAPE U-SHAPE 47X51 STRL (DRAPES) ×1 IMPLANT
DRESSING MEPILEX FLEX 4X4 (GAUZE/BANDAGES/DRESSINGS) ×1 IMPLANT
DRSG MEPILEX FLEX 4X4 (GAUZE/BANDAGES/DRESSINGS) ×1
ELECT REM PT RETURN 9FT ADLT (ELECTROSURGICAL) ×1
ELECTRODE REM PT RTRN 9FT ADLT (ELECTROSURGICAL) ×1 IMPLANT
GLOVE BIO SURGEON STRL SZ7.5 (GLOVE) ×1 IMPLANT
GLOVE BIO SURGEON STRL SZ8 (GLOVE) ×1 IMPLANT
GLOVE BIOGEL PI IND STRL 7.5 (GLOVE) ×1 IMPLANT
GLOVE BIOGEL PI IND STRL 8 (GLOVE) ×1 IMPLANT
GLOVE SURG ORTHO LTX SZ7.5 (GLOVE) ×2 IMPLANT
GOWN STRL REUS W/ TWL LRG LVL3 (GOWN DISPOSABLE) ×2 IMPLANT
GOWN STRL REUS W/ TWL XL LVL3 (GOWN DISPOSABLE) ×1 IMPLANT
GOWN STRL REUS W/TWL LRG LVL3 (GOWN DISPOSABLE) ×2
GOWN STRL REUS W/TWL XL LVL3 (GOWN DISPOSABLE) ×1
KIT BASIN OR (CUSTOM PROCEDURE TRAY) ×1 IMPLANT
KIT TURNOVER KIT B (KITS) ×1 IMPLANT
MANIFOLD NEPTUNE II (INSTRUMENTS) ×1 IMPLANT
NS IRRIG 1000ML POUR BTL (IV SOLUTION) ×1 IMPLANT
PACK GENERAL/GYN (CUSTOM PROCEDURE TRAY) ×1 IMPLANT
PACK UNIVERSAL I (CUSTOM PROCEDURE TRAY) IMPLANT
PAD ARMBOARD 7.5X6 YLW CONV (MISCELLANEOUS) ×2 IMPLANT
PIN GUIDE DRILL TIP 2.8X300 (DRILL) IMPLANT
SCREW CANN 8X145X40 (Screw) IMPLANT
STAPLER VISISTAT 35W (STAPLE) ×1 IMPLANT
SUT ETHILON 2 0 FS 18 (SUTURE) ×1 IMPLANT
SUT VIC AB 2-0 FS1 27 (SUTURE) ×1 IMPLANT
TOWEL GREEN STERILE (TOWEL DISPOSABLE) ×2 IMPLANT
TOWEL GREEN STERILE FF (TOWEL DISPOSABLE) ×1 IMPLANT
UNDERPAD 30X36 HEAVY ABSORB (UNDERPADS AND DIAPERS) ×1 IMPLANT
WATER STERILE IRR 1000ML POUR (IV SOLUTION) ×1 IMPLANT
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
## 2023-01-25 NOTE — Anesthesia Procedure Notes (Signed)
Procedure Name: Intubation Date/Time: 01/25/2023 12:22 PM  Performed by: Camillia Herter, CRNAPre-anesthesia Checklist: Patient identified, Emergency Drugs available, Suction available and Patient being monitored Patient Re-evaluated:Patient Re-evaluated prior to induction Oxygen Delivery Method: Circle System Utilized Preoxygenation: Pre-oxygenation with 100% oxygen Induction Type: IV induction Ventilation: Mask ventilation without difficulty Laryngoscope Size: Miller and 2 Grade View: Grade I Tube type: Oral Number of attempts: 1 Airway Equipment and Method: Stylet and Oral airway Placement Confirmation: ETT inserted through vocal cords under direct vision, positive ETCO2 and breath sounds checked- equal and bilateral Tube secured with: Tape Dental Injury: Teeth and Oropharynx as per pre-operative assessment

## 2023-01-25 NOTE — Progress Notes (Signed)
Patient's surgery was rescheduled for this afternoon per Dr. Salvadore Farber, because patient ate salad at 04:00 o'clock. The RN was notified to keep patient NPO.

## 2023-01-25 NOTE — Plan of Care (Signed)
  Problem: Clinical Measurements: Goal: Will remain free from infection Outcome: Progressing Goal: Diagnostic test results will improve Outcome: Progressing Goal: Respiratory complications will improve Outcome: Progressing Goal: Cardiovascular complication will be avoided Outcome: Progressing   Problem: Activity: Goal: Risk for activity intolerance will decrease Outcome: Progressing   Problem: Nutrition: Goal: Adequate nutrition will be maintained Outcome: Progressing   Problem: Coping: Goal: Level of anxiety will decrease Outcome: Progressing   Problem: Elimination: Goal: Will not experience complications related to bowel motility Outcome: Progressing Goal: Will not experience complications related to urinary retention Outcome: Progressing   

## 2023-01-25 NOTE — Anesthesia Postprocedure Evaluation (Signed)
Anesthesia Post Note  Patient: Hannah Burns  Procedure(s) Performed: Sacroiliac Screw Fixation (Left: Hip)     Patient location during evaluation: PACU Anesthesia Type: General Level of consciousness: awake and alert, oriented and patient cooperative Pain management: pain level controlled Vital Signs Assessment: post-procedure vital signs reviewed and stable Respiratory status: spontaneous breathing, nonlabored ventilation and respiratory function stable Cardiovascular status: blood pressure returned to baseline and stable Postop Assessment: no apparent nausea or vomiting Anesthetic complications: no   No notable events documented.  Last Vitals:  Vitals:   01/25/23 1415 01/25/23 1449  BP: 113/74 (!) 145/71  Pulse: (!) 51 (!) 57  Resp: 11 17  Temp: 36.7 C (!) 36.4 C  SpO2: 96% 98%    Last Pain:  Vitals:   01/25/23 1415  TempSrc:   PainSc: Asleep                 Lannie Fields
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
## 2023-01-25 NOTE — Plan of Care (Signed)

## 2023-01-25 NOTE — Progress Notes (Signed)
Pt endorses occasional use of mariajuana. Pt's urine drug screen positive 01/24/23 for cocaine. Pt asked last cocaine use, which she denied. Stated, "I don't use cocaine, it could be showing up because I touched some." Anesthesia made aware.

## 2023-01-25 NOTE — Transfer of Care (Signed)
Immediate Anesthesia Transfer of Care Note  Patient: Hannah Burns  Procedure(s) Performed: Sacroiliac Screw Fixation (Left: Hip)  Patient Location: PACU  Anesthesia Type:General  Level of Consciousness: awake, alert , and oriented  Airway & Oxygen Therapy: Patient connected to nasal cannula oxygen  Post-op Assessment: Report given to RN  Post vital signs: stable  Last Vitals:  Vitals Value Taken Time  BP 109/70 01/25/23 1345  Temp 36.5 C 01/25/23 1324  Pulse 55 01/25/23 1349  Resp 15 01/25/23 1349  SpO2 97 % 01/25/23 1349  Vitals shown include unfiled device data.  Last Pain:  Vitals:   01/25/23 1127  TempSrc:   PainSc: 7       Patients Stated Pain Goal: 3 (01/25/23 1127)  Complications: No notable events documented.

## 2023-01-25 NOTE — Anesthesia Preprocedure Evaluation (Addendum)
Anesthesia Evaluation  Patient identified by MRN, date of birth, ID band Patient awake    Reviewed: Allergy & Precautions, H&P , NPO status , Patient's Chart, lab work & pertinent test results  Airway Mallampati: III  TM Distance: >3 FB Neck ROM: Full    Dental no notable dental hx.    Pulmonary former smoker   Pulmonary exam normal breath sounds clear to auscultation       Cardiovascular negative cardio ROS Normal cardiovascular exam Rhythm:Regular Rate:Normal     Neuro/Psych negative neurological ROS  negative psych ROS   GI/Hepatic negative GI ROS, Neg liver ROS,,,  Endo/Other  BMI 35  Renal/GU negative Renal ROS  negative genitourinary   Musculoskeletal negative musculoskeletal ROS (+)    Abdominal  (+) + obese  Peds negative pediatric ROS (+)  Hematology negative hematology ROS (+) Hb 12.6, plt 157   Anesthesia Other Findings UDS cocaine positive   Reproductive/Obstetrics negative OB ROS                             Anesthesia Physical Anesthesia Plan  ASA: 3  Anesthesia Plan: General   Post-op Pain Management:    Induction: Intravenous  PONV Risk Score and Plan: 3 and Ondansetron, Dexamethasone, Midazolam, Treatment may vary due to age or medical condition and Scopolamine patch - Pre-op  Airway Management Planned: Oral ETT  Additional Equipment: None  Intra-op Plan:   Post-operative Plan: Extubation in OR  Informed Consent: I have reviewed the patients History and Physical, chart, labs and discussed the procedure including the risks, benefits and alternatives for the proposed anesthesia with the patient or authorized representative who has indicated his/her understanding and acceptance.     Dental advisory given  Plan Discussed with: CRNA  Anesthesia Plan Comments:        Anesthesia Quick Evaluation

## 2023-01-26 LAB — CBC
HCT: 37.1 % (ref 36.0–46.0)
Hemoglobin: 12.3 g/dL (ref 12.0–15.0)
MCH: 31.2 pg (ref 26.0–34.0)
MCHC: 33.2 g/dL (ref 30.0–36.0)
MCV: 94.2 fL (ref 80.0–100.0)
Platelets: 161 10*3/uL (ref 150–400)
RBC: 3.94 MIL/uL (ref 3.87–5.11)
RDW: 11.8 % (ref 11.5–15.5)
WBC: 10.1 10*3/uL (ref 4.0–10.5)
nRBC: 0 % (ref 0.0–0.2)

## 2023-01-26 LAB — BASIC METABOLIC PANEL
Anion gap: 7 (ref 5–15)
BUN: 8 mg/dL (ref 6–20)
CO2: 24 mmol/L (ref 22–32)
Calcium: 8.7 mg/dL — ABNORMAL LOW (ref 8.9–10.3)
Chloride: 103 mmol/L (ref 98–111)
Creatinine, Ser: 0.77 mg/dL (ref 0.44–1.00)
GFR, Estimated: >60 mL/min (ref >60)
Glucose, Bld: 113 mg/dL — ABNORMAL HIGH (ref 70–99)
Potassium: 4.6 mmol/L (ref 3.5–5.1)
Sodium: 134 mmol/L — ABNORMAL LOW (ref 135–145)

## 2023-01-26 LAB — MAGNESIUM: Magnesium: 1.8 mg/dL (ref 1.7–2.4)

## 2023-01-26 LAB — VITAMIN D 25 HYDROXY (VIT D DEFICIENCY, FRACTURES): Vit D, 25-Hydroxy: 11.06 ng/mL — ABNORMAL LOW (ref 30–100)

## 2023-01-26 MED ORDER — CHLORHEXIDINE GLUCONATE CLOTH 2 % EX PADS
6.0000 | MEDICATED_PAD | Freq: Every day | CUTANEOUS | Status: DC
  Administered 2023-01-26 – 2023-01-28 (×3): 6 via TOPICAL

## 2023-01-26 MED ORDER — IBUPROFEN 200 MG PO TABS
600.0000 mg | ORAL_TABLET | Freq: Three times a day (TID) | ORAL | Status: DC
  Administered 2023-01-26 – 2023-01-28 (×7): 600 mg via ORAL
  Filled 2023-01-26 (×7): qty 3
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
## 2023-01-26 NOTE — Progress Notes (Signed)
Patient had sacroiliac fixation yesterday.  She has a foley.    Will leave foley in place until she starts moving today and getting up OOB.
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
## 2023-01-26 NOTE — Progress Notes (Signed)
? ?  Inpatient Rehab Admissions Coordinator : ? ?Per therapy recommendations, patient was screened for CIR candidacy by Barbara Boyette RN MSN.  At this time patient appears to be a potential candidate for CIR. I will place a rehab consult per protocol for full assessment. Please call me with any questions. ? ?Barbara Boyette RN MSN ?Admissions Coordinator ?[phone] ?  ?
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
## 2023-01-27 ENCOUNTER — Encounter (HOSPITAL_COMMUNITY): Payer: Self-pay | Admitting: Orthopedic Surgery

## 2023-01-27 LAB — CBC
HCT: 34.7 % — ABNORMAL LOW (ref 36.0–46.0)
Hemoglobin: 11.4 g/dL — ABNORMAL LOW (ref 12.0–15.0)
MCH: 31.4 pg (ref 26.0–34.0)
MCHC: 32.9 g/dL (ref 30.0–36.0)
MCV: 95.6 fL (ref 80.0–100.0)
Platelets: 134 10*3/uL — ABNORMAL LOW (ref 150–400)
RBC: 3.63 MIL/uL — ABNORMAL LOW (ref 3.87–5.11)
RDW: 11.9 % (ref 11.5–15.5)
WBC: 6.2 10*3/uL (ref 4.0–10.5)
nRBC: 0 % (ref 0.0–0.2)

## 2023-01-27 LAB — HEPATITIS PANEL, ACUTE
HCV Ab: NONREACTIVE
Hep A IgM: NONREACTIVE
Hep B C IgM: NONREACTIVE
Hepatitis B Surface Ag: NONREACTIVE

## 2023-01-27 MED ORDER — METHOCARBAMOL 750 MG PO TABS
750.0000 mg | ORAL_TABLET | Freq: Three times a day (TID) | ORAL | Status: DC
  Administered 2023-01-27 – 2023-01-28 (×2): 750 mg via ORAL
  Filled 2023-01-27 (×3): qty 1

## 2023-01-27 MED ORDER — OXYCODONE HCL 5 MG PO TABS
5.0000 mg | ORAL_TABLET | ORAL | Status: DC | PRN
  Administered 2023-01-27: 5 mg via ORAL
  Administered 2023-01-27 – 2023-01-28 (×4): 10 mg via ORAL
  Filled 2023-01-27 (×3): qty 2
  Filled 2023-01-27: qty 1
  Filled 2023-01-27: qty 2

## 2023-01-27 MED ORDER — METHOCARBAMOL 750 MG PO TABS
750.0000 mg | ORAL_TABLET | Freq: Three times a day (TID) | ORAL | Status: AC
  Administered 2023-01-27: 750 mg via ORAL
  Filled 2023-01-27: qty 1

## 2023-01-27 MED ORDER — HYDROMORPHONE HCL 1 MG/ML IJ SOLN
0.5000 mg | INTRAMUSCULAR | Status: DC | PRN
  Administered 2023-01-27: 0.5 mg via INTRAVENOUS
  Filled 2023-01-27: qty 0.5

## 2023-01-27 MED ORDER — POLYETHYLENE GLYCOL 3350 17 G PO PACK
17.0000 g | PACK | Freq: Every day | ORAL | Status: DC
  Administered 2023-01-27: 17 g via ORAL
  Filled 2023-01-27 (×2): qty 1
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
## 2023-01-27 NOTE — Progress Notes (Signed)
2 Days Post-Op  Subjective: CC: Reports pelvic pain that is worse with movement. Was MinA for transfers and ambulated 21' with RW during PT yesterday. Recommended for CIR. Has support from her mother, grandmother and sister at d/c. Tolerating diet. Last BM 8/20. Foley in place.   Afebrile. No tachycardia or systolic hypotension. Foley with good uop, 75mlkg/hr over the last 24 houyrs. BM x 1. WBC wnl. Hgb 11.4 from 12.3. Required IV pain medication x1 yesterday.   Objective: Vital signs in last 24 hours: Temp:  [98.5 F (36.9 C)-99.2 F (37.3 C)] 99.2 F (37.3 C) (08/21 0600) Pulse Rate:  [58-68] 63 (08/21 0600) Resp:  [17-18] 18 (08/21 0600) BP: (113-128)/(51-76) 113/59 (08/21 0600) SpO2:  [98 %-100 %] 99 % (08/21 0600) Last BM Date : 01/26/23  Intake/Output from previous day: 08/20 0701 - 08/21 0700 In: 440 [P.O.:240; IV Piggyback:200] Out: 2000 [Urine:2000] Intake/Output this shift: No intake/output data recorded.  PE: Gen:  Alert, NAD, pleasant Card:  RRR Pulm:  CTAB, no W/R/R, effort normal Abd: Soft, ND, NT Ext:  No LE edema or calf tenderness. SILT to LE's. Able plantar flexion and dorsiflexion to LE's.  Psych: A&Ox3   Lab Results:  Recent Labs    01/26/23 0050 01/27/23 0257  WBC 10.1 6.2  HGB 12.3 11.4*  HCT 37.1 34.7*  PLT 161 134*   BMET Recent Labs    01/25/23 0312 01/26/23 0050  NA 134* 134*  K 3.1* 4.6  CL 101 103  CO2 24 24  GLUCOSE 109* 113*  BUN 8 8  CREATININE 0.69 0.77  CALCIUM 8.5* 8.7*   PT/INR Recent Labs    01/24/23 1456  LABPROT 14.6  INR 1.1   CMP     Component Value Date/Time   NA 134 (L) 01/26/2023 0050   K 4.6 01/26/2023 0050   CL 103 01/26/2023 0050   CO2 24 01/26/2023 0050   GLUCOSE 113 (H) 01/26/2023 0050   BUN 8 01/26/2023 0050   CREATININE 0.77 01/26/2023 0050   CALCIUM 8.7 (L) 01/26/2023 0050   PROT 7.9 01/24/2023 1456   ALBUMIN 3.9 01/24/2023 1456   AST 24 01/24/2023 1456   ALT 22 01/24/2023 1456    ALKPHOS 63 01/24/2023 1456   BILITOT 0.9 01/24/2023 1456   GFRNONAA >60 01/26/2023 0050   GFRAA >60 12/24/2018 0145   Lipase     Component Value Date/Time   LIPASE 30 01/06/2023 0424    Studies/Results: DG Pelvis Comp Min 3V  Result Date: 01/25/2023 CLINICAL DATA:  Pelvic fracture EXAM: JUDET PELVIS - 3+ VIEW COMPARISON:  CT 01/24/2023 FINDINGS: Interval bilateral sacroiliac arthrodesis utilizing a single transverse lag screw. Sacroiliac joint spaces are normal and symmetric. Fractures of the right superior and inferior pubic rami are again identified with fracture fragments in near anatomic alignment. No interval fracture. No dislocation. Bilateral hip joint spaces are preserved. IMPRESSION: 1. Interval bilateral sacroiliac arthrodesis. 2. Aligned right superior and inferior pubic rami fractures. Electronically Signed   By: Helyn Numbers M.D.   On: 01/25/2023 22:07   DG Si Joints  Result Date: 01/25/2023 CLINICAL DATA:  Elective surgery. EXAM: BILATERAL SACROILIAC JOINTS - 3+ VIEW COMPARISON:  Preoperative CT FINDINGS: Eleven fluoroscopic spot views of the sacroiliac joints and pelvis obtained in the operating room. Screw traverses both sacroiliac joints. Fluoroscopy time 43 seconds. Dose 33.63 mGy. IMPRESSION: Intraoperative fluoroscopy during sacroiliac joint fixation. Electronically Signed   By: Narda Rutherford M.D.   On: 01/25/2023 16:34  DG C-Arm 1-60 Min-No Report  Result Date: 01/25/2023 Fluoroscopy was utilized by the requesting physician.  No radiographic interpretation.    Anti-infectives: Anti-infectives (From admission, onward)    Start     Dose/Rate Route Frequency Ordered Stop   01/25/23 2000  ceFAZolin (ANCEF) IVPB 2g/100 mL premix        2 g 200 mL/hr over 30 Minutes Intravenous Every 8 hours 01/25/23 1440 01/26/23 1702   01/25/23 1215  ceFAZolin (ANCEF) IVPB 2g/100 mL premix        2 g 200 mL/hr over 30 Minutes Intravenous  Once 01/25/23 1203 01/25/23 1256    01/25/23 1204  ceFAZolin (ANCEF) 2-4 GM/100ML-% IVPB       Note to Pharmacy: Garen Lah E: cabinet override      01/25/23 1204 01/25/23 1337        Assessment/Plan PSBV 8/18 Pelvic fracture - Per Ortho. Dr. Carola Frost. S/p SI screw fixation 8/19. WBAT BLE. PT/OT ABL anemia - No tachycardia or systolic hypotension. Hgb 15.3 > 14.3 >12.6 > 12.3 > 11.4. Repeat in AM Polysubstance use - tobacco 1 ppd, marijuana daily; encourage cessation FEN - Reg, SLIV VTE - SCDs, Lovenox. Will need Xarelto x 30d at d/c per Ortho Foley: TOV today.  ID - Peri-op abx. None currently.  Dispo - Wean IV pain medications. PO regimen adjusted. PT/OT. CIR consult.   I reviewed nursing notes, Consultant (Ortho) notes, last 24 h vitals and pain scores, last 48 h intake and output, last 24 h labs and trends, and last 24 h imaging results.     LOS: 3 days    Jacinto Halim , [facility] 01/27/2023, 7:20 AM Please see Amion for pager number during day hours 7:00am-4:30pm

## 2023-01-27 NOTE — Plan of Care (Signed)
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
## 2023-01-27 NOTE — Progress Notes (Signed)
Inpatient Rehab Coordinator Note:  I met with patient at bedside to discuss CIR recommendations and goals/expectations of CIR stay.  We reviewed 3 hrs/day of therapy, physician follow up, and average length of stay 2 weeks (dependent upon progress) with goals of supervision to mod I.  We discussed d/c either to her home where she lives with her mom and 34 y/o daughter, or her grandmother's home, which is more accessible, depending on progress.  She is mobilizing well, but feels that she would benefit most from a short term rehab prior to returning home.  We discussed insurance authorization process and I will start that today.  Will also reach out to update her sister per her request.   Estill Dooms, PT, DPT Admissions Coordinator [phone] 01/27/23  11:45 AM

## 2023-01-28 ENCOUNTER — Inpatient Hospital Stay (HOSPITAL_COMMUNITY)
Admission: RE | Admit: 2023-01-28 | Discharge: 2023-02-02 | DRG: 560 | Disposition: A | Discharge status: Home Health | Payer: Medicaid Other | Source: Intra-hospital | Attending: Physical Medicine and Rehabilitation | Admitting: Physical Medicine and Rehabilitation

## 2023-01-28 ENCOUNTER — Encounter (HOSPITAL_COMMUNITY): Payer: Self-pay | Admitting: Orthopedic Surgery

## 2023-01-28 ENCOUNTER — Other Ambulatory Visit: Payer: Self-pay

## 2023-01-28 ENCOUNTER — Inpatient Hospital Stay (HOSPITAL_COMMUNITY): Payer: Medicaid Other

## 2023-01-28 DIAGNOSIS — D62 Acute posthemorrhagic anemia: Secondary | ICD-10-CM | POA: Diagnosis present

## 2023-01-28 DIAGNOSIS — R609 Edema, unspecified: Secondary | ICD-10-CM | POA: Diagnosis not present

## 2023-01-28 DIAGNOSIS — Z833 Family history of diabetes mellitus: Secondary | ICD-10-CM

## 2023-01-28 DIAGNOSIS — K5900 Constipation, unspecified: Secondary | ICD-10-CM | POA: Diagnosis present

## 2023-01-28 DIAGNOSIS — S329XXA Fracture of unspecified parts of lumbosacral spine and pelvis, initial encounter for closed fracture: Secondary | ICD-10-CM | POA: Diagnosis present

## 2023-01-28 DIAGNOSIS — F1910 Other psychoactive substance abuse, uncomplicated: Secondary | ICD-10-CM | POA: Diagnosis not present

## 2023-01-28 DIAGNOSIS — S32421A Displaced fracture of posterior wall of right acetabulum, initial encounter for closed fracture: Secondary | ICD-10-CM

## 2023-01-28 DIAGNOSIS — S32411D Displaced fracture of anterior wall of right acetabulum, subsequent encounter for fracture with routine healing: Secondary | ICD-10-CM | POA: Diagnosis present

## 2023-01-28 DIAGNOSIS — S329XXD Fracture of unspecified parts of lumbosacral spine and pelvis, subsequent encounter for fracture with routine healing: Secondary | ICD-10-CM | POA: Diagnosis not present

## 2023-01-28 DIAGNOSIS — K5901 Slow transit constipation: Secondary | ICD-10-CM | POA: Diagnosis not present

## 2023-01-28 DIAGNOSIS — E559 Vitamin D deficiency, unspecified: Secondary | ICD-10-CM | POA: Diagnosis present

## 2023-01-28 DIAGNOSIS — S32421D Displaced fracture of posterior wall of right acetabulum, subsequent encounter for fracture with routine healing: Secondary | ICD-10-CM | POA: Diagnosis not present

## 2023-01-28 DIAGNOSIS — S32421S Displaced fracture of posterior wall of right acetabulum, sequela: Secondary | ICD-10-CM | POA: Diagnosis not present

## 2023-01-28 DIAGNOSIS — Z87891 Personal history of nicotine dependence: Secondary | ICD-10-CM

## 2023-01-28 DIAGNOSIS — R52 Pain, unspecified: Secondary | ICD-10-CM | POA: Diagnosis not present

## 2023-01-28 LAB — CBC
HCT: 35.9 % — ABNORMAL LOW (ref 36.0–46.0)
Hemoglobin: 12.1 g/dL (ref 12.0–15.0)
MCH: 31.8 pg (ref 26.0–34.0)
MCHC: 33.7 g/dL (ref 30.0–36.0)
MCV: 94.2 fL (ref 80.0–100.0)
Platelets: 169 10*3/uL (ref 150–400)
RBC: 3.81 MIL/uL — ABNORMAL LOW (ref 3.87–5.11)
RDW: 11.8 % (ref 11.5–15.5)
WBC: 5 10*3/uL (ref 4.0–10.5)
nRBC: 0 % (ref 0.0–0.2)

## 2023-01-28 MED ORDER — TRAZODONE HCL 50 MG PO TABS: 50.0000 mg | ORAL_TABLET | Freq: Every evening | ORAL | Status: DC | PRN

## 2023-01-28 MED ORDER — ACETAMINOPHEN 500 MG PO TABS
1000.0000 mg | ORAL_TABLET | Freq: Four times a day (QID) | ORAL | Status: DC
  Administered 2023-01-28 – 2023-02-02 (×15): 1000 mg via ORAL
  Filled 2023-01-28 (×16): qty 2

## 2023-01-28 MED ORDER — IBUPROFEN 600 MG PO TABS
600.0000 mg | ORAL_TABLET | Freq: Three times a day (TID) | ORAL | Status: DC
  Administered 2023-01-28 – 2023-02-02 (×15): 600 mg via ORAL
  Filled 2023-01-28 (×16): qty 1

## 2023-01-28 MED ORDER — METHOCARBAMOL 750 MG PO TABS
750.0000 mg | ORAL_TABLET | Freq: Four times a day (QID) | ORAL | Status: DC
  Administered 2023-01-28 – 2023-01-30 (×7): 750 mg via ORAL
  Filled 2023-01-28 (×8): qty 1

## 2023-01-28 MED ORDER — ALUM & MAG HYDROXIDE-SIMETH 200-200-20 MG/5ML PO SUSP: 30.0000 mL | ORAL | Status: DC | PRN

## 2023-01-28 MED ORDER — DIPHENHYDRAMINE HCL 25 MG PO CAPS: 25.0000 mg | ORAL_CAPSULE | Freq: Four times a day (QID) | ORAL | Status: DC | PRN

## 2023-01-28 MED ORDER — ENOXAPARIN SODIUM 30 MG/0.3ML IJ SOSY
30.0000 mg | PREFILLED_SYRINGE | Freq: Two times a day (BID) | INTRAMUSCULAR | Status: DC
  Administered 2023-01-28 – 2023-01-31 (×7): 30 mg via SUBCUTANEOUS
  Filled 2023-01-28 (×7): qty 0.3

## 2023-01-28 MED ORDER — ENOXAPARIN SODIUM 30 MG/0.3ML IJ SOSY: 30.0000 mg | PREFILLED_SYRINGE | Freq: Two times a day (BID) | INTRAMUSCULAR | Status: DC

## 2023-01-28 MED ORDER — SORBITOL 70 % SOLN: 30.0000 mL | Freq: Every day | Status: DC | PRN

## 2023-01-28 MED ORDER — DOCUSATE SODIUM 100 MG PO CAPS
100.0000 mg | ORAL_CAPSULE | Freq: Two times a day (BID) | ORAL | Status: DC
  Administered 2023-01-28 – 2023-01-29 (×2): 100 mg via ORAL
  Filled 2023-01-28 (×2): qty 1

## 2023-01-28 MED ORDER — OXYCODONE HCL 5 MG PO TABS
5.0000 mg | ORAL_TABLET | ORAL | Status: DC | PRN
  Administered 2023-01-28 – 2023-02-02 (×23): 10 mg via ORAL
  Filled 2023-01-28 (×17): qty 2
  Filled 2023-01-28: qty 1
  Filled 2023-01-28 (×6): qty 2

## 2023-01-28 MED ORDER — METHOCARBAMOL 750 MG PO TABS
750.0000 mg | ORAL_TABLET | Freq: Four times a day (QID) | ORAL | Status: DC
  Administered 2023-01-28: 750 mg via ORAL

## 2023-01-28 MED ORDER — MAGNESIUM HYDROXIDE 400 MG/5ML PO SUSP: 30.0000 mL | Freq: Every day | ORAL | Status: DC | PRN

## 2023-01-28 MED ORDER — ONDANSETRON HCL 4 MG/2ML IJ SOLN: 4.0000 mg | Freq: Four times a day (QID) | INTRAMUSCULAR | Status: DC | PRN

## 2023-01-28 MED ORDER — FLEET ENEMA RE ENEM: 1.0000 | ENEMA | Freq: Once | RECTAL | Status: DC | PRN

## 2023-01-28 MED ORDER — POLYETHYLENE GLYCOL 3350 17 G PO PACK
17.0000 g | PACK | Freq: Every day | ORAL | Status: DC
  Administered 2023-01-29: 17 g via ORAL
  Filled 2023-01-28: qty 1

## 2023-01-28 MED ORDER — ONDANSETRON HCL 4 MG PO TABS: 4.0000 mg | ORAL_TABLET | Freq: Four times a day (QID) | ORAL | Status: DC | PRN

## 2023-01-28 MED ORDER — GUAIFENESIN-DM 100-10 MG/5ML PO SYRP: 10.0000 mL | ORAL_SOLUTION | Freq: Four times a day (QID) | ORAL | Status: DC | PRN

## 2023-01-28 NOTE — Progress Notes (Signed)
Lower extremity venous bilateral study completed.   Please see CV Proc for preliminary results.   Rachel Hodge, RDMS, RVT  
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
## 2023-01-28 NOTE — Progress Notes (Signed)
Mobility Specialist: Progress Note   01/28/23 1024  Mobility  Activity Ambulated with assistance in room  Level of Assistance Contact guard assist, steadying assist  Assistive Device Front wheel walker  Distance Ambulated (ft) 15 ft  RLE Weight Bearing WBAT  LLE Weight Bearing WBAT  Activity Response Tolerated well  Mobility Referral Yes  $Mobility charge 1 Mobility  Mobility Specialist Start Time (ACUTE ONLY) [id number]  Mobility Specialist Stop Time (ACUTE ONLY) 0948  Mobility Specialist Time Calculation (min) (ACUTE ONLY) 11 min    Pt ws agreeable to mobility session - received in bed. SV for bed mobility, CG for STS and ambulation. Had c/o LLE pain - rated 7/10. Denies SOB, fatigue, and dizziness. Returned to bed without fault. Left in bed with all needs met, call bell in reach.   Maurene Capes Mobility Specialist Please contact via SecureChat or Rehab office at [phone]

## 2023-01-28 NOTE — Plan of Care (Signed)
  Problem: Education: Goal: Knowledge of General Education information will improve Description: Including pain rating scale, medication(s)/side effects and non-pharmacologic comfort measures Outcome: Progressing   Problem: Activity: Goal: Risk for activity intolerance will decrease Outcome: Progressing   Problem: Pain Managment: Goal: General experience of comfort will improve Outcome: Progressing   

## 2023-01-28 NOTE — Progress Notes (Signed)
Inpatient Rehab Admissions Coordinator:    I have insurance approval and a bed available for pt to admit to CIR today. Bailey Mech, PA-C, in agreement.  Will let pt/family and TOC team know.   Estill Dooms, PT, DPT Admissions Coordinator [phone] 01/28/23  10:28 AM

## 2023-01-28 NOTE — Discharge Instructions (Addendum)
Inpatient Rehab Discharge Instructions  [place] Discharge date and time:  02/02/2023  Activities/Precautions/ Functional Status: Activity: no lifting, driving, or strenuous exercise until cleared by MD; weight bearing on legs as tolerated Diet: regular diet Wound Care: keep wound clean and dry Functional status:  ___ No restrictions     ___ Walk up steps independently ___ 24/7 supervision/assistance   ___ Walk up steps with assistance __x_ Intermittent supervision/assistance  ___ Bathe/dress independently ___ Walk with walker     ___ Bathe/dress with assistance ___ Walk Independently    ___ Shower independently ___ Walk with assistance    __x_ Shower with assistance __x_ No alcohol     ___ Return to work/school ________  Special Instructions: No driving, alcohol consumption or tobacco use.   COMMUNITY REFERRALS UPON DISCHARGE:    Outpatient: PT             Agency: [facility]  Phone: [phone]             Appointment Date/Time: *Please expect follow-up within 7-10 business days to schedule your appointment. If you have not received follow-up, be sure to contact the site directly.*  Medical Equipment/Items Ordered: 3in1 bedside commode, tub transfer bench, and rolling walker                                                 Agency/Supplier: [employer] [id number]  GENERAL COMMUNITY RESOURCES FOR PATIENT/FAMILY: A PCS (person care services) referral was faxed on your behalf to [facility] management department. Please expect follow-up to schedule your home visit. To check status, be sure to call #[phone].    My questions have been answered and I understand these instructions. I will adhere to these goals and the provided educational materials after my discharge from the hospital.  Patient/Caregiver Signature _______________________________ Date __________  Clinician Signature _______________________________________ Date __________  Please bring this  form and your medication list with you to all your follow-up doctor's appointments.   Information on my medicine - XARELTO (Rivaroxaban)  This medication education was reviewed with me or my healthcare representative as part of my discharge preparation.   Why was Xarelto prescribed for you? Xarelto was prescribed for you to reduce the risk of blood clots forming after orthopedic surgery. The medical term for these abnormal blood clots is venous thromboembolism (VTE).  What do you need to know about xarelto ? Take your Xarelto ONCE DAILY at the same time every day. You may take it either with or without food.  If you have difficulty swallowing the tablet whole, you may crush it and mix in applesauce just prior to taking your dose.  Take Xarelto exactly as prescribed by your doctor and DO NOT stop taking Xarelto without talking to the doctor who prescribed the medication.  Stopping without other VTE prevention medication to take the place of Xarelto may increase your risk of developing a clot.  After discharge, you should have regular check-up appointments with your healthcare provider that is prescribing your Xarelto.    What do you do if you miss a dose? If you miss a dose, take it as soon as you remember on the same day then continue your regularly scheduled once daily regimen the next day. Do not take two doses of Xarelto on the same day.   Important Safety Information A possible  side effect of Xarelto is bleeding. You should call your healthcare provider right away if you experience any of the following: Bleeding from an injury or your nose that does not stop. Unusual colored urine (red or dark brown) or unusual colored stools (red or black). Unusual bruising for unknown reasons. A serious fall or if you hit your head (even if there is no bleeding).  Some medicines may interact with Xarelto and might increase your risk of bleeding while on Xarelto. To help avoid this,  consult your healthcare provider or pharmacist prior to using any new prescription or non-prescription medications, including herbals, vitamins, non-steroidal anti-inflammatory drugs (NSAIDs) and supplements.  This website has more information on Xarelto: [email or web address].br.

## 2023-01-28 NOTE — Progress Notes (Signed)
Report given to staff nurse at 4W, all questions and concerns were fully answered. Pt remains alert/oriented in no apparent distress. NO complaints voiced.
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
## 2023-01-28 NOTE — Progress Notes (Signed)
Inpatient Rehabilitation Admission Medication Review by a Pharmacist   A complete drug regimen review was completed for this patient to identify any potential clinically significant medication issues.   High Risk Drug Classes Is patient taking? Indication by Medication  Antipsychotic No    Anticoagulant Yes Lovenox - DVT prophylaxis  Antibiotic No    Opioid Yes Oxycodone PRN pain  Antiplatelet No    Hypoglycemics/insulin No    Vasoactive Medication No    Chemotherapy No    Other Yes Benadryl PRN itching Zofran PRN N/V Sorbitol PRN constipation Trazodone PRN sleep IBU pain Robaxin muscle relaxer        Type of Medication Issue Identified Description of Issue Recommendation(s)  Drug Interaction(s) (clinically significant)        Duplicate Therapy        Allergy        No Medication Administration End Date        Incorrect Dose        Additional Drug Therapy Needed        Significant med changes from prior encounter (inform family/care partners about these prior to discharge).      Other            Clinically significant medication issues were identified that warrant physician communication and completion of prescribed/recommended actions by midnight of the next day:  No     Pharmacist comments:    Time spent performing this drug regimen review (minutes):  30 minutes  Ruben Im, PharmD Clinical Pharmacist 01/28/2023 2:38 PM Please check AMION for all [facility] numbers
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
## 2023-01-29 ENCOUNTER — Encounter (HOSPITAL_COMMUNITY): Payer: Self-pay | Admitting: Physical Medicine and Rehabilitation

## 2023-01-29 DIAGNOSIS — S32421A Displaced fracture of posterior wall of right acetabulum, initial encounter for closed fracture: Secondary | ICD-10-CM | POA: Diagnosis not present

## 2023-01-29 LAB — COMPREHENSIVE METABOLIC PANEL
ALT: 16 U/L (ref 0–44)
AST: 23 U/L (ref 15–41)
Albumin: 3.1 g/dL — ABNORMAL LOW (ref 3.5–5.0)
Alkaline Phosphatase: 42 U/L (ref 38–126)
Anion gap: 7 (ref 5–15)
BUN: 6 mg/dL (ref 6–20)
CO2: 27 mmol/L (ref 22–32)
Calcium: 8.8 mg/dL — ABNORMAL LOW (ref 8.9–10.3)
Chloride: 102 mmol/L (ref 98–111)
Creatinine, Ser: 0.8 mg/dL (ref 0.44–1.00)
GFR, Estimated: >60 mL/min (ref >60)
Glucose, Bld: 88 mg/dL (ref 70–99)
Potassium: 3.7 mmol/L (ref 3.5–5.1)
Sodium: 136 mmol/L (ref 135–145)
Total Bilirubin: 0.7 mg/dL (ref 0.3–1.2)
Total Protein: 6.3 g/dL — ABNORMAL LOW (ref 6.5–8.1)

## 2023-01-29 LAB — CBC WITH DIFFERENTIAL/PLATELET
Abs Immature Granulocytes: 0.01 10*3/uL (ref 0.00–0.07)
Basophils Absolute: 0.1 10*3/uL (ref 0.0–0.1)
Basophils Relative: 1 %
Eosinophils Absolute: 0.5 10*3/uL (ref 0.0–0.5)
Eosinophils Relative: 9 %
HCT: 35 % — ABNORMAL LOW (ref 36.0–46.0)
Hemoglobin: 11.9 g/dL — ABNORMAL LOW (ref 12.0–15.0)
Immature Granulocytes: 0 %
Lymphocytes Relative: 39 %
Lymphs Abs: 2.1 10*3/uL (ref 0.7–4.0)
MCH: 31.7 pg (ref 26.0–34.0)
MCHC: 34 g/dL (ref 30.0–36.0)
MCV: 93.3 fL (ref 80.0–100.0)
Monocytes Absolute: 0.5 10*3/uL (ref 0.1–1.0)
Monocytes Relative: 9 %
Neutro Abs: 2.2 10*3/uL (ref 1.7–7.7)
Neutrophils Relative %: 42 %
Platelets: 169 10*3/uL (ref 150–400)
RBC: 3.75 MIL/uL — ABNORMAL LOW (ref 3.87–5.11)
RDW: 11.7 % (ref 11.5–15.5)
WBC: 5.3 10*3/uL (ref 4.0–10.5)
nRBC: 0 % (ref 0.0–0.2)

## 2023-01-29 MED ORDER — POLYETHYLENE GLYCOL 3350 17 G PO PACK
17.0000 g | PACK | Freq: Two times a day (BID) | ORAL | Status: DC
  Administered 2023-01-31 – 2023-02-02 (×4): 17 g via ORAL
  Filled 2023-01-29 (×6): qty 1

## 2023-01-29 MED ORDER — SENNOSIDES-DOCUSATE SODIUM 8.6-50 MG PO TABS
1.0000 | ORAL_TABLET | Freq: Two times a day (BID) | ORAL | Status: DC
  Administered 2023-01-29 – 2023-02-02 (×9): 1 via ORAL
  Filled 2023-01-29 (×9): qty 1

## 2023-01-29 NOTE — IPOC Note (Signed)
Overall Plan of Care [facility]) Patient Details Name: Hannah Burns MRN: [id number] DOB: 13-Sep-1990  Admitting Diagnosis: Pelvic fracture [facility])  Hospital Problems: Principal Problem:   Pelvic fracture (HCC)     Functional Problem List: Nursing Bladder, Bowel, Edema, Endurance, Medication Management, Pain, Safety  PT Balance, Pain  OT Balance, Edema, Pain, Motor, Endurance  SLP    TR         Basic ADL's: OT Dressing, Toileting, Bathing     Advanced  ADL's: OT       Transfers: PT Bed Mobility, Bed to Chair, Car, Occupational psychologist, Research scientist (life sciences): PT Ambulation, Stairs     Additional Impairments: OT None  SLP        TR      Anticipated Outcomes Item Anticipated Outcome  Self Feeding no goal  Swallowing      Basic self-care  mod I  Toileting  mod I   Bathroom Transfers mod I  Bowel/Bladder  continent of B/B  Transfers  Supervision  Locomotion  Supervisoin  Communication     Cognition     Pain  less than 4  Safety/Judgment  no falls   Therapy Plan: PT Intensity: Minimum of 1-2 x/day ,45 to 90 minutes PT Frequency: 5 out of 7 days PT Duration Estimated Length of Stay: 5-7 Days OT Intensity: Minimum of 1-2 x/day, 45 to 90 minutes OT Frequency: 5 out of 7 days OT Duration/Estimated Length of Stay: 3-5 days     Team Interventions: Nursing Interventions Patient/Family Education, Skin Care/Wound Management, Discharge Planning, Bladder Management, Bowel Management, Pain Management, Psychosocial Support, Medication Management  PT interventions Ambulation/gait training, DME/adaptive equipment instruction, Neuromuscular re-education, Stair training, Community reintegration, Psychosocial support, UE/LE Strength taining/ROM, Warden/ranger, Discharge planning, Pain management, Therapeutic Activities, UE/LE Coordination activities, Functional mobility training, Patient/family education, Therapeutic Exercise  OT Interventions  Warden/ranger, DME/adaptive equipment instruction, Patient/family education, Therapeutic Activities, Wheelchair propulsion/positioning, Psychosocial support, Therapeutic Exercise, Community reintegration, Functional mobility training, Self Care/advanced ADL retraining, UE/LE Strength taining/ROM, Discharge planning, UE/LE Coordination activities, Pain management  SLP Interventions    TR Interventions    SW/CM Interventions Discharge Planning, Psychosocial Support, Patient/Family Education   Barriers to Discharge MD  Medical stability, Home enviroment access/loayout, Wound care, Lack of/limited family support, and Insurance for SNF coverage  Nursing Decreased caregiver support, Home environment access/layout, Incontinence, Wound Care, Weight, Weight bearing restrictions, Medication compliance home with family 2 level with B/B up 12 ste with 1 ste entry no rails  PT      OT Inaccessible home environment Will likely go to grandma's house instead of hers  SLP      SW Decreased caregiver support, Lack of/limited family support, Community education officer for SNF coverage     Team Discharge Planning: Destination: PT-Home ,OT- Home , SLP-  Projected Follow-up: PT-Outpatient PT, OT-  None, SLP-  Projected Equipment Needs: PT-Rolling walker with 5" wheels, OT- 3 in 1 bedside comode, Tub/shower bench, SLP-  Equipment Details: PT- , OT-  Patient/family involved in discharge planning: PT- Patient,  OT-Patient, SLP-   MD ELOS: 5 to 7 days Medical Rehab Prognosis:  Excellent Assessment: The patient has been admitted for CIR therapies with the diagnosis of pelvic fracture. The team will be addressing functional mobility, strength, stamina, balance, safety, adaptive techniques and equipment, self-care, bowel and bladder mgt, patient and caregiver education. Goals have been set at independent. Anticipated discharge destination is home.       See Team Conference Notes  for weekly updates to the plan of  care
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
## 2023-01-29 NOTE — Patient Care Conference (Signed)
Inpatient RehabilitationTeam Conference and Plan of Care Update Date: 01/29/2023   Time: 11:39 AM   Patient Name: Hannah Burns      Medical Record Number: [id number]  Date of Birth: [birth year] Sex: Female         Room/Bed: 4W26C/4W26C-01 Payor Info: Payor: [employer] MEDICAID PREPAID HEALTH PLAN / Plan: [place] MEDICAID UNITEDHEALTHCARE COMMUNITY / Product Type: *No Product type* /    Admit Date/Time:  01/28/2023  1:53 PM  Primary Diagnosis:  Pelvic fracture [facility])  Hospital Problems: Principal Problem:   Pelvic fracture [facility])    Expected Discharge Date: Expected Discharge Date: 02/02/23  Team Members Present: Physician leading conference: Dr. Elijah Birk Social Worker Present: Cecile Sheerer, LCSWA Nurse Present: Vedia Pereyra, RN PT Present: Malachi Pro, PT OT Present: Jake Shark, OT     Current Status/Progress Goal Weekly Team Focus  Bowel/Bladder   Continent of bowel and bladder with constipation.  More frequent bowel movements  Address constipation with scheduled and PRN medications   Swallow/Nutrition/ Hydration               ADL's   Supervision/mod I with BADL tasks   Mod I   family education, dc planning, self-care retraining    Mobility   supervision to mod(I) all mobility   Supervision  family ed, DC prep    Communication                Safety/Cognition/ Behavioral Observations               Pain   uncontrolled pain managing with scheduled and PRN medications   Less than 4 with scheduled and PRN medications.   Improved pain control    Skin   Skin intactIncision to left hip with attached edges with sutures. No drainage noted.    Continued healing without breakdown or infection  Continue to monitor incision every shift and PRN and encourage frequent movement to reduce the risk or pressure areas.      Discharge Planning:  Pt will d/c ot home with support from her mother's home who works durign the day. Pt grandmother can provide supervision  and light Min Asst. FAm edu on Saturday (8/24) 10am-12pm with pt mother and sister. DME ordered- RW, 3in1 BSC, and TTB.  SW will confirm there are no barriers to discharge.   Team Discussion: Pelvic fracture. Addressing constipation. Pain managed with PRN medications. Incision to left hip has attached edges with sutures, no drainage.  Patient on target to meet rehab goals: yes, meeting goals with discharge date of 02/02/23  *See Care Plan and progress notes for long and short-term goals.   Revisions to Treatment Plan:  NA  Teaching Needs: Medications, safety, self care, transfer training, incision care, etc.   Current Barriers to Discharge: Decreased caregiver support, Wound care, and Weight bearing restrictions  Possible Resolutions to Barriers: Family education/discharge planning Monitor incision for changes Adhere to weight bearing restrictions Order recommended DME     Medical Summary Current Status: medically complicated pelvic fracture with poor pain control, constipation, and anemia  Barriers to Discharge: Medical stability;Self-care education;Uncontrolled Pain  Barriers to Discharge Comments: pain, constipation Possible Resolutions to [employer] Focus: titrate pain medications to least sedating, titrate bowel meds for opiate induced constipation   Continued Need for Acute Rehabilitation Level of Care: The patient requires daily medical management by a physician with specialized training in physical medicine and rehabilitation for the following reasons: Direction of a multidisciplinary physical rehabilitation program to maximize functional independence :  Yes Medical management of patient stability for increased activity during participation in an intensive rehabilitation regime.: Yes Analysis of laboratory values and/or radiology reports with any subsequent need for medication adjustment and/or medical intervention. : Yes   I attest that I was present, lead the team  conference, and concur with the assessment and plan of the team.   Jearld Adjutant 01/29/2023, 11:39 AM
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
## 2023-01-29 NOTE — Care Management (Signed)
Inpatient Rehabilitation Center Individual Statement of Services  Patient Name:  Hannah Burns  Date:  01/29/2023  Welcome to the Inpatient Rehabilitation Center.  Our goal is to provide you with an individualized program based on your diagnosis and situation, designed to meet your specific needs.  With this comprehensive rehabilitation program, you will be expected to participate in at least 3 hours of rehabilitation therapies Monday-Friday, with modified therapy programming on the weekends.  Your rehabilitation program will include the following services:  Physical Therapy (PT), Occupational Therapy (OT), 24 hour per day rehabilitation nursing, Therapeutic Recreaction (TR), Psychology, Neuropsychology, Care Coordinator, Rehabilitation Medicine, Nutrition Services, Pharmacy Services, and Other  Weekly team conferences will be held on Tuesdays to discuss your progress.  Your Inpatient Rehabilitation Care Coordinator will talk with you frequently to get your input and to update you on team discussions.  Team conferences with you and your family in attendance may also be held.  Expected length of stay: 3-5 days    Overall anticipated outcome: Independent with Assistive Device  Depending on your progress and recovery, your program may change. Your Inpatient Rehabilitation Care Coordinator will coordinate services and will keep you informed of any changes. Your Inpatient Rehabilitation Care Coordinator's name and contact numbers are listed  below.  The following services may also be recommended but are not provided by the Inpatient Rehabilitation Center:  Driving Evaluations Home Health Rehabiltiation Services Outpatient Rehabilitation Services Vocational Rehabilitation   Arrangements will be made to provide these services after discharge if needed.  Arrangements include referral to agencies that provide these services.  Your insurance has been verified to be:  [place] Medicaid Unitedhealthcare  Community  Your primary doctor is:  Gwinda Passe  Pertinent information will be shared with your doctor and your insurance company.  Inpatient Rehabilitation Care Coordinator:  Susie Cassette [phone] or ([phone]  Information discussed with and copy given to patient by: Gretchen Short, 01/29/2023, 2:16 PM

## 2023-01-29 NOTE — Progress Notes (Signed)
Inpatient Rehabilitation  Patient information reviewed and entered into eRehab system by Melissa M. Bowie, M.A., CCC/SLP, PPS Coordinator.  Information including medical coding, functional ability and quality indicators will be reviewed and updated through discharge.    

## 2023-01-29 NOTE — Progress Notes (Addendum)
Patient ID: Hannah Burns, female   DOB: 08-28-1990, 32 y.o.   MRN: [id number]  SW received updates from medical team reporting on gains made in rehab and d/c date 8/27.  SW met with pt in room to share on above. She states she will go home with her mother at this time as she does not want to be a burden to her grandmother. Mother's address: [address], [place] [address]. Current address on face sheet is address for her grandmother Jerrye Beavers [phone]). SW informed will confirm all d/c recs once informed. If outpatient needed, preferred location is [facility] (if Pt/OT), if only OT- [facility].   1256- SW spoke with pt mother Laiklynn Tula [phone]) and sister Dondra Prader [phone]) via conference call to provide above updates. Pt mother confirms pt can d/c to her home. Reports her home has 5 STE and 15 stairs to get to second bathroom, so pt will need bathroom DME. SW informed will confirm all DME once aware. SW sill follow-up to confirm fam edu after speaking with scheduler.   1420- Fam edu scheduled for Monday 10am-12pm with pt mother and sister.   *SW received updates from PT, RW is needed. OT- TTB and 3in1 BSC.   SW ordered items with [employer] via parachute.   SW updated pt on DME ordered.   Cecile Sheerer, MSW, LCSWA Office: [phone] Cell: [phone] Fax: [phone]
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
## 2023-01-29 NOTE — Progress Notes (Signed)
Met with patient.  She had heard that she would be discharging soon.  Discussed educational binder, and working on increasing foods high in Ca and Vit D. Discussed pain medication.  Wanted to know what the reddish pill was. Most likely is Advil/Ibuprofen. Wanted to know if would go home with oxycodone, suggested she discuss with MD. Discussed scheduled Robaxin for additional pain control.  Discussed DVT prophylaxis Xarelto x 30 days at discharge and the importance of taking it as prescribed. Voiding continently. LBM was day of surgery and reports was small.  Request PRN sorbitol to be given today.  Discussed once home to try and get back to regular schedule and try not to exceed 3 days.  If continues Miralax at home to be sure to place powder in glass first and then add liquid or will not mix properly.  All needs met, call bell in reach.

## 2023-01-30 DIAGNOSIS — K5901 Slow transit constipation: Secondary | ICD-10-CM

## 2023-01-30 DIAGNOSIS — R52 Pain, unspecified: Secondary | ICD-10-CM | POA: Diagnosis not present

## 2023-01-30 DIAGNOSIS — F1910 Other psychoactive substance abuse, uncomplicated: Secondary | ICD-10-CM | POA: Diagnosis not present

## 2023-01-30 DIAGNOSIS — S329XXD Fracture of unspecified parts of lumbosacral spine and pelvis, subsequent encounter for fracture with routine healing: Secondary | ICD-10-CM

## 2023-01-30 MED ORDER — METHOCARBAMOL 500 MG PO TABS
1000.0000 mg | ORAL_TABLET | Freq: Four times a day (QID) | ORAL | Status: DC
  Administered 2023-01-30 – 2023-02-02 (×12): 1000 mg via ORAL
  Filled 2023-01-30 (×12): qty 2
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
## 2023-01-30 NOTE — Progress Notes (Signed)
Occupational Therapy Discharge Summary  Patient Details  Name: Hannah Burns MRN: [id number] Date of Birth: 1990/12/23  Date of Discharge from OT service:February 01, 2023   Patient has met 5 of 5 long term goals due to improved activity tolerance, improved balance, postural control, and ability to compensate for deficits.  Patient to discharge at overall Modified Independent level.  Patient's care partner is independent to provide the necessary physical assistance at discharge. Hannah has made excellent progress and is ready for discharge home with the intermittent supervision from her mother and grandma.   Recommendation:  No further OT f/u needed.   Equipment: BSC, TTB, transport w/c, RW   Reasons for discharge: treatment goals met and discharge from hospital  Patient/family agrees with progress made and goals achieved: Yes  OT Discharge Precautions/Restrictions  Precautions Precautions: Fall Restrictions Weight Bearing Restrictions: Yes RLE Weight Bearing: Weight bearing as tolerated LLE Weight Bearing: Weight bearing as tolerated ADL ADL Eating: Independent Grooming: Independent Where Assessed-Grooming: Standing at sink Upper Body Bathing: Independent Where Assessed-Upper Body Bathing: Shower Lower Body Bathing: Modified independent Where Assessed-Lower Body Bathing: Shower Upper Body Dressing: Modified independent (Device) Where Assessed-Upper Body Dressing: Sitting at sink Lower Body Dressing: Modified independent Where Assessed-Lower Body Dressing: Edge of bed Toileting: Modified independent Where Assessed-Toileting: Neurosurgeon Method: Proofreader: Gaffer: Modified independent Web designer Method: Psychologist, prison and probation services: Insurance underwriter: Modified independent Film/video editor Method: Manufacturing systems engineer: Sales promotion account executive Baseline Vision/History: 1 Wears glasses Patient Visual Report: No change from baseline Vision Assessment?: No apparent visual deficits Perception  Perception: Within Functional Limits Praxis Praxis: WFL Cognition Cognition Overall Cognitive Status: Within Functional Limits for tasks assessed Arousal/Alertness: Awake/alert Orientation Level: Person;Place;Situation Person: Oriented Place: Oriented Situation: Oriented Memory: Appears intact Awareness: Appears intact Problem Solving: Appears intact Safety/Judgment: Appears intact Sensation Sensation Light Touch: Appears Intact Coordination Gross Motor Movements are Fluid and Coordinated: Yes Fine Motor Movements are Fluid and Coordinated: Yes Coordination and Movement Description: Limited by pain guarding Motor  Motor Motor: Within Functional Limits Mobility  Bed Mobility Bed Mobility: Sit to Supine;Supine to Sit Supine to Sit: Independent Sit to Supine: Independent Transfers Sit to Stand: Independent with assistive device Stand to Sit: Independent with assistive device  Trunk/Postural Assessment  Cervical Assessment Cervical Assessment: Within Functional Limits Thoracic Assessment Thoracic Assessment: Within Functional Limits Lumbar Assessment Lumbar Assessment: Within Functional Limits Postural Control Postural Control: Deficits on evaluation Righting Reactions: delayed 2/2 pain guarding  Balance Balance Balance Assessed: Yes Static Sitting Balance Static Sitting - Level of Assistance: 7: Independent Dynamic Sitting Balance Dynamic Sitting - Level of Assistance: 7: Independent Static Standing Balance Static Standing - Level of Assistance: 6: Modified independent (Device/Increase time) Dynamic Standing Balance Dynamic Standing - Level of Assistance: 6: Modified independent (Device/Increase time) Extremity/Trunk Assessment RUE Assessment RUE Assessment: Within Functional  Limits LUE Assessment LUE Assessment: Within Functional Limits   Crissie Reese 01/30/2023, 9:57 AM

## 2023-01-30 NOTE — Progress Notes (Signed)
Physical Therapy Session Note  Patient Details  Name: Hannah Burns MRN: [id number] Date of Birth: [birth year]  Today's Date: 01/30/2023 PT Individual Time: 1045-1155 + 1415 PT Individual Time Calculation (min): 70 min + 0 min  Short Term Goals: Week 1:  PT Short Term Goal 1 (Week 1): STGs = LTGs  Skilled Therapeutic Interventions/Progress Updates:     Session 1: Chart reviewed and pt agreeable to therapy. Pt received seated in recliner with 4/10 c/o pain in hips. Session focused on amb independence and stair navigation to promote safe home access. Pt initiated session with amb to toilet using S + RW, and pt independent with peri-care. Pt then completed amb of 219ft to therapy gym with S + RW. Pt noted to have slow gait speed but good balance and stability during gait. Pt then completed blocked practice of stair navigation completing up to a set of 16 steps with distant S + unilateral handrail per home set up. Pt then began to experience pain. Pt BP monitored and WNL. Pt c/o of high pain limited further amb. Pt returned to room via WC. In room, pt amb to toilet with distance S + Rw and returned to bed with distant S + RW. At end of session, pt was left seated in [facility] with alarm engaged, nurse call bell and all needs in reach.  Session 2: Chart reviewed. Pt greeted. Pt reported very high pain continuing from previous session. PT discussed role of therapy. Pt politely indicated desire to rest and recover from high pain. PT explained making up time. Pt agreeable. At end of session, pt was left semi-reclined in bed with alarm engaged, nurse call bell and all needs in reach.   Therapy Documentation Precautions:  Precautions Precautions: Fall Restrictions Weight Bearing Restrictions: Yes RLE Weight Bearing: Weight bearing as tolerated LLE Weight Bearing: Weight bearing as tolerated General: PT Amount of Missed Time (min): 75 Minutes PT Missed Treatment Reason: Pain    Therapy/Group: Individual  Therapy  Dionne Milo, PT, DPT 01/30/2023, 2:43 PM

## 2023-01-31 DIAGNOSIS — S329XXD Fracture of unspecified parts of lumbosacral spine and pelvis, subsequent encounter for fracture with routine healing: Secondary | ICD-10-CM | POA: Diagnosis not present

## 2023-01-31 DIAGNOSIS — F1910 Other psychoactive substance abuse, uncomplicated: Secondary | ICD-10-CM | POA: Diagnosis not present

## 2023-01-31 DIAGNOSIS — R52 Pain, unspecified: Secondary | ICD-10-CM | POA: Diagnosis not present

## 2023-01-31 DIAGNOSIS — K5901 Slow transit constipation: Secondary | ICD-10-CM | POA: Diagnosis not present
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
## 2023-01-31 NOTE — Progress Notes (Signed)
Occupational Therapy Session Note  Patient Details  Name: Hannah Burns MRN: [id number] Date of Birth: [birth year]  {CHL IP REHAB OT TIME CALCULATIONS:[id number]}   Short Term Goals: Week 1:  OT Short Term Goal 1 (Week 1): STG= LTG d/t ELOS  Skilled Therapeutic Interventions/Progress Updates:    Patient agreeable to participate in OT session. Reports *** pain level.   Patient participated in skilled OT session focusing on ***. Therapist facilitated/assessed/developed/educated/integrated/elicited *** in order to improve/facilitate/promote    Therapy Documentation Precautions:  Precautions Precautions: Fall Restrictions Weight Bearing Restrictions: Yes RLE Weight Bearing: Weight bearing as tolerated LLE Weight Bearing: Weight bearing as tolerated  Therapy/Group: Individual Therapy  Limmie Patricia, OTR/L,CBIS  Supplemental OT - [facility] and [facility] Secure Chat Preferred   01/31/2023, 9:53 PM

## 2023-02-01 ENCOUNTER — Other Ambulatory Visit (HOSPITAL_COMMUNITY): Payer: Self-pay

## 2023-02-01 DIAGNOSIS — S32421A Displaced fracture of posterior wall of right acetabulum, initial encounter for closed fracture: Secondary | ICD-10-CM | POA: Diagnosis not present

## 2023-02-01 LAB — BASIC METABOLIC PANEL
Anion gap: 8 (ref 5–15)
BUN: 8 mg/dL (ref 6–20)
CO2: 23 mmol/L (ref 22–32)
Calcium: 8.7 mg/dL — ABNORMAL LOW (ref 8.9–10.3)
Chloride: 104 mmol/L (ref 98–111)
Creatinine, Ser: 0.84 mg/dL (ref 0.44–1.00)
GFR, Estimated: >60 mL/min (ref >60)
Glucose, Bld: 90 mg/dL (ref 70–99)
Potassium: 4 mmol/L (ref 3.5–5.1)
Sodium: 135 mmol/L (ref 135–145)

## 2023-02-01 LAB — CBC
HCT: 33.7 % — ABNORMAL LOW (ref 36.0–46.0)
Hemoglobin: 11.3 g/dL — ABNORMAL LOW (ref 12.0–15.0)
MCH: 32.5 pg (ref 26.0–34.0)
MCHC: 33.5 g/dL (ref 30.0–36.0)
MCV: 96.8 fL (ref 80.0–100.0)
Platelets: 176 10*3/uL (ref 150–400)
RBC: 3.48 MIL/uL — ABNORMAL LOW (ref 3.87–5.11)
RDW: 12 % (ref 11.5–15.5)
WBC: 6.4 10*3/uL (ref 4.0–10.5)
nRBC: 0 % (ref 0.0–0.2)

## 2023-02-01 MED ORDER — RIVAROXABAN 10 MG PO TABS
10.0000 mg | ORAL_TABLET | Freq: Every day | ORAL | Status: DC
  Administered 2023-02-01 – 2023-02-02 (×2): 10 mg via ORAL
  Filled 2023-02-01 (×2): qty 1

## 2023-02-01 MED ORDER — SORBITOL 70 % SOLN
30.0000 mL | Freq: Once | Status: DC
  Filled 2023-02-01: qty 30
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
## 2023-02-01 NOTE — TOC Benefit Eligibility Note (Signed)
Patient Product/process development scientist completed.    The patient is insured through [facility] MEDICAID.     Ran test claim for Xarelto 10 mg and the current 30 day co-pay is $4.00.   This test claim was processed through [facility]- copay amounts may vary at other pharmacies due to pharmacy/plan contracts, or as the patient moves through the different stages of their insurance plan.     Roland Earl, CPHT Pharmacy Technician III Certified Patient Advocate [facility] Patient Advocate Team Direct Number: [phone]  Fax: [phone]

## 2023-02-01 NOTE — Progress Notes (Addendum)
Patient ID: Hannah Burns, female   DOB: 04/22/1991, 32 y.o.   MRN: [id number]  SW faxed Outpatient PT referral to [facility] (p:[phone]/[phone]).   SW faxed and emailed PCS referral to [facility] -Fax # [phone] / [email or web address].   SW spoke with pt and sister on above referral sent. Confirms DME received.   *SW received phone call from [facility] requesting office notes from physician. SW will submit once note is completed.   Cecile Sheerer, MSW, LCSWA Office: [phone] Cell: [phone] Fax: [phone]

## 2023-02-01 NOTE — Progress Notes (Signed)
Needs Xarelto for 30 days at discharge per ortho. Placed pharmacy consult order.

## 2023-02-01 NOTE — Progress Notes (Signed)
Occupational Therapy Session Note  Patient Details  Name: [place] Nored MRN: [id number] Date of Birth: 1991/05/27  Today's Date: 02/01/2023 OT Individual Time: 1005-1100 OT Individual Time Calculation (min): 55 min    Short Term Goals: Week 1:  OT Short Term Goal 1 (Week 1): STG= LTG d/t ELOS  Skilled Therapeutic Interventions/Progress Updates:    Pt greeted seated in recliner with family present for family education. Education provided regarding safe BADL performance in home environment, home modifications, DME needs, energy conservation techniques, and safety awareness. Pt ambulated to therapy apartment mod I and practiced tub bench transfer mod I as well. Pt ambulated back to dayroom w/ RW mod I. OT issued home UB exercise program and went through each exercise 10x.  Straight Arm Pulls x10 Straight Arm Raise x10 Side Arm Raise x10 Diagonal Pull x10 Opposite Diagonal Pull x10 Over Head Pull Down x10 Forearm Pull x10 Triceps Elbow Extension x10 Bicep Curl x10  Pt ambulated back to room and left seated in recliner with case manager entering room. Needs met.  Therapy Documentation Precautions:  Precautions Precautions: Fall Restrictions Weight Bearing Restrictions: Yes RLE Weight Bearing: Weight bearing as tolerated LLE Weight Bearing: Weight bearing as tolerated Pain: Pain Assessment Pain Scale: 0-10 Pain Score: 4  Pain Type: Acute pain;Surgical pain Pain Location: Hip Pain Descriptors / Indicators: Aching Pain Frequency: Constant Pain Onset: On-going Patients Stated Pain Goal: 3 Pain Intervention(s): Medication (See eMAR)  Therapy/Group: Individual Therapy  Mal Amabile 02/01/2023, 10:40 AM
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
## 2023-02-01 NOTE — Plan of Care (Signed)
  Problem: RH Balance Goal: LTG Patient will maintain dynamic standing with ADLs (OT) Description: LTG:  Patient will maintain dynamic standing balance with assist during activities of daily living (OT)  Outcome: Completed/Met   Problem: RH Dressing Goal: LTG Patient will perform lower body dressing w/assist (OT) Description: LTG: Patient will perform lower body dressing with assist, with/without cues in positioning using equipment (OT) Outcome: Completed/Met   Problem: RH Toileting Goal: LTG Patient will perform toileting task (3/3 steps) with assistance level (OT) Description: LTG: Patient will perform toileting task (3/3 steps) with assistance level (OT)  Outcome: Completed/Met   Problem: RH Toilet Transfers Goal: LTG Patient will perform toilet transfers w/assist (OT) Description: LTG: Patient will perform toilet transfers with assist, with/without cues using equipment (OT) Outcome: Completed/Met   Problem: RH Tub/Shower Transfers Goal: LTG Patient will perform tub/shower transfers w/assist (OT) Description: LTG: Patient will perform tub/shower transfers with assist, with/without cues using equipment (OT) Outcome: Completed/Met

## 2023-02-01 NOTE — Progress Notes (Signed)
Physical Therapy Discharge Summary  Patient Details  Name: Hannah Burns MRN: [id number] Date of Birth: April 18, 1991  Date of Discharge from PT service:February 01, 2023  Today's Date: 02/01/2023 PT Individual Time: 1102-1159 and and 1334-1444 PT Individual Time Calculation (min): 57 min and 70 min   Patient has met 7 of 7 long term goals due to improved activity tolerance, improved balance, increased strength, and decreased pain.  Patient to discharge at an ambulatory level Modified Independent.   Patient's care partner is independent to provide the necessary physical assistance at discharge.  Reasons goals not met: NA  Recommendation:  Patient will benefit from ongoing skilled PT services in outpatient setting to continue to advance safe functional mobility, address ongoing impairments in balance, ambulation, and minimize fall risk.  Equipment: RW  Reasons for discharge: treatment goals met and discharge from hospital  Patient/family agrees with progress made and goals achieved: Yes  Skilled Therapeutic Interventions: 1st Session: Pt received seated in recliner and agrees to therapy. Reports 7/10 pain in Rt hip/pelvis. PT provides education regarding pain and gentle mobility, as well as rest breaks to manage pain. Pt performs sit to stand with RW. Pt then ambulates x300' with RW to ortho gym. Pt completes ramp navigation and car transfer with RW at mod(I). Seated rest break. Pt ambulates x100' to main gym with RW and takes rest break. Pt then completes x12 6" steps with Rt handrail and cues for safety and step sequencing. Pt transitions to supine on mat table. Pt completes supine therex for strengthening and increasing ROM of BLEs. Pt completes x15 heel slides, SLRs, and SAQs with 5lb ankle weights. PT provides verbal and tacitile cues for correct performance. Supine to sit independently. Pt ambulates back to room with RW. Left seated with all needs within reach.   2nd Session: Pt received  seated in recliner and agrees to therapy. Reports pain in R hip and pelvis> Lt. PT provides mobility and rest breaks to manage pain. Pt performs sit to stand and ambulatory transfer to toilet with RW. Pt ambulates x150' to gym with WR. Pt completes Nustep for strengthening and improving BLE ROM. Pt completes x12:00 at workload of 5 with average steps per minutes. PT provides cues for hand and foot placement and completing full available ROM. Following pt transfers to hard backed chair with RW. Pt performs 3x10 reps of sit to stand while holding onto 5lb medicine ball to promote increased core engagement and optimal anterior weight shift for transfer. Pt then completes x10 diagonal lifts with medicine ball, from Lt lower quadrant to Rt upper quadrant, and vice versa, working on coordination and strengthening. Pt then completes Wii bowling game for coordination, balance and, dynamic upper and lower extremity movements with real time visual feedback. Pt ambulates back to room with RW. Left seated with all needs within reach.   PT Discharge Precautions/Restrictions Precautions Precautions: Fall Restrictions Weight Bearing Restrictions: Yes RLE Weight Bearing: Weight bearing as tolerated LLE Weight Bearing: Weight bearing as tolerated Pain Interference Pain Interference Pain Effect on Sleep: 1. Rarely or not at all Pain Interference with Therapy Activities: 1. Rarely or not at all Pain Interference with Day-to-Day Activities: 1. Rarely or not at all Vision/Perception  Vision - History Ability to See in Adequate Light: 0 Adequate Perception Perception: Within Functional Limits Praxis Praxis: WFL  Cognition Overall Cognitive Status: Within Functional Limits for tasks assessed Arousal/Alertness: Awake/alert Orientation Level: Oriented X4 Memory: Appears intact Awareness: Appears intact Problem Solving: Appears intact Safety/Judgment: Appears  intact Sensation Sensation Light Touch: Appears  Intact Coordination Gross Motor Movements are Fluid and Coordinated: Yes Fine Motor Movements are Fluid and Coordinated: Yes Coordination and Movement Description: Limited by pain guarding Motor  Motor Motor: Within Functional Limits  Mobility Bed Mobility Bed Mobility: Sit to Supine;Supine to Sit Supine to Sit: Independent Sit to Supine: Independent Transfers Transfers: Sit to Stand;Stand to Sit;Stand Pivot Transfers Sit to Stand: Independent with assistive device Stand to Sit: Independent with assistive device Stand Pivot Transfers: Independent with assistive device Transfer (Assistive device): Rolling walker Locomotion  Gait Ambulation: Yes Gait Assistance: Independent with assistive device Gait Distance (Feet): 300 Feet Assistive device: Rolling walker Gait Gait: Yes Gait Pattern: Impaired Gait Pattern: Decreased stride length Gait velocity: decreased Stairs / Additional Locomotion Stairs: Yes Stairs Assistance: Supervision/Verbal cueing Stair Management Technique: One rail Right Number of Stairs: 12 Height of Stairs: 6 Ramp: Independent with assistive device Curb: Independent with assistive device Wheelchair Mobility Wheelchair Mobility: No  Trunk/Postural Assessment  Cervical Assessment Cervical Assessment: Within Functional Limits Thoracic Assessment Thoracic Assessment: Within Functional Limits Lumbar Assessment Lumbar Assessment: Within Functional Limits Postural Control Postural Control: Deficits on evaluation Righting Reactions: delayed 2/2 pain guarding  Balance Balance Balance Assessed: Yes Static Sitting Balance Static Sitting - Balance Support: Feet supported Static Sitting - Level of Assistance: 7: Independent Dynamic Sitting Balance Dynamic Sitting - Balance Support: During functional activity;Feet supported Dynamic Sitting - Level of Assistance: 7: Independent Static Standing Balance Static Standing - Balance Support: Bilateral upper  extremity supported Static Standing - Level of Assistance: 6: Modified independent (Device/Increase time) Dynamic Standing Balance Dynamic Standing - Balance Support: Bilateral upper extremity supported Dynamic Standing - Level of Assistance: 6: Modified independent (Device/Increase time) Extremity Assessment  RLE Assessment RLE Assessment: Exceptions to [facility] Strength Comments: Limited by pain but grossly 4/5 LLE Assessment LLE Assessment: Exceptions to [facility] General Strength Comments: Limited by pain but grossly 4/5   Beau Fanny, PT, DPT 02/01/2023, 4:32 PM

## 2023-02-01 NOTE — Progress Notes (Signed)
Inpatient Rehabilitation Discharge Medication Review by a Pharmacist  A complete drug regimen review was completed for this patient to identify any potential clinically significant medication issues.  High Risk Drug Classes Is patient taking? Indication by Medication  Antipsychotic No   Anticoagulant Yes Xarelto- ortho vte ppx s/p hip surgery x30 days  Antibiotic No   Opioid Yes OxyIR- acute post-surgical pain  Antiplatelet No   Hypoglycemics/insulin No   Vasoactive Medication No   Chemotherapy No   Other Yes Robaxin- muscle spasms     Type of Medication Issue Identified Description of Issue Recommendation(s)  Drug Interaction(s) (clinically significant)     Duplicate Therapy     Allergy     No Medication Administration End Date     Incorrect Dose     Additional Drug Therapy Needed     Significant med changes from prior encounter (inform family/care partners about these prior to discharge).    Other       Clinically significant medication issues were identified that warrant physician communication and completion of prescribed/recommended actions by midnight of the next day:  No   Time spent performing this drug regimen review (minutes):  30   Ariyana Faw BS, PharmD, BCPS Clinical Pharmacist 02/01/2023 12:19 PM  Contact: [phone] after 3 PM  "Be curious, not judgmental..." -Debbora Dus

## 2023-02-02 ENCOUNTER — Other Ambulatory Visit (HOSPITAL_COMMUNITY): Payer: Self-pay

## 2023-02-02 DIAGNOSIS — S32421S Displaced fracture of posterior wall of right acetabulum, sequela: Secondary | ICD-10-CM

## 2023-02-02 DIAGNOSIS — S32421D Displaced fracture of posterior wall of right acetabulum, subsequent encounter for fracture with routine healing: Secondary | ICD-10-CM

## 2023-02-02 MED ORDER — POLYETHYLENE GLYCOL 3350 17 G PO PACK: 17.0000 g | PACK | Freq: Two times a day (BID) | ORAL | Status: DC

## 2023-02-02 MED ORDER — IBUPROFEN 600 MG PO TABS: 600.0000 mg | ORAL_TABLET | Freq: Three times a day (TID) | ORAL | Status: DC | PRN

## 2023-02-02 MED ORDER — SENNOSIDES-DOCUSATE SODIUM 8.6-50 MG PO TABS: 1.0000 | ORAL_TABLET | Freq: Two times a day (BID) | ORAL | Status: DC

## 2023-02-02 MED ORDER — ACETAMINOPHEN 500 MG PO TABS: 500.0000 mg | ORAL_TABLET | Freq: Four times a day (QID) | ORAL | Status: DC | PRN

## 2023-02-02 MED ORDER — METHOCARBAMOL 500 MG PO TABS
1000.0000 mg | ORAL_TABLET | Freq: Four times a day (QID) | ORAL | 0 refills | Status: DC
Start: 2023-02-02 — End: 2023-07-06
  Filled 2023-02-02: qty 112, 14d supply, fill #0

## 2023-02-02 MED ORDER — OXYCODONE HCL 5 MG PO TABS
5.0000 mg | ORAL_TABLET | ORAL | 0 refills | Status: DC | PRN
  Filled 2023-02-02: qty 30, 5d supply, fill #0

## 2023-02-02 MED ORDER — RIVAROXABAN 10 MG PO TABS
10.0000 mg | ORAL_TABLET | Freq: Every day | ORAL | 0 refills | Status: DC
Start: 2023-02-02 — End: 2023-07-12
  Filled 2023-02-02: qty 28, 28d supply, fill #0

## 2023-02-02 NOTE — Progress Notes (Signed)
Inpatient Rehabilitation Care Coordinator Discharge Note   Patient Details  Name: Hannah Burns MRN: [id number] Date of Birth: 11/20/1990   Discharge location: D/c to her mother's home  Length of Stay: 4 days  Discharge activity level: Mod I  Home/community participation: Limited  Patient response NW:GNFAOZ Literacy - How often do you need to have someone help you when you read instructions, pamphlets, or other written material from your doctor or pharmacy?: Never  Patient response HY:QMVHQI Isolation - How often do you feel lonely or isolated from those around you?: Never  Services provided included: MD, PT, OT, SLP, Pharmacy, Neuropsych, SW, RN, RD, CM, TR  Financial Services:  Field seismologist Utilized: Private Insurance [place] Medicaid [employer]  Choices offered to/list presented to: patient  Follow-up services arranged:  Outpatient, DME    Outpatient Servicies: [facility] @ [address] for PT only DME : [employer] for 3in1 BSC, TTB, RW, and transport chair (to be shipped to home)    Patient response to transportation need: Is the patient able to respond to transportation needs?: Yes In the past 12 months, has lack of transportation kept you from medical appointments or from getting medications?: No In the past 12 months, has lack of transportation kept you from meetings, work, or from getting things needed for daily living?: No   Patient/Family verbalized understanding of follow-up arrangements:  Yes  Individual responsible for coordination of the follow-up plan: contact pt [phone]  Confirmed correct DME delivered: Gretchen Short 02/02/2023    Comments (or additional information):fam edu completed. Transport chair ordered on date of discharge and being shipped to home. Pt is aware to contact [employer] to check status.   Summary of Stay    Date/Time Discharge Planning CSW  01/29/23 1139 Pt will d/c ot home with support from her mother's home  who works durign the day. Pt grandmother can provide supervision and light Min Asst. FAm edu on Saturday (8/24) 10am-12pm with pt mother and sister. DME ordered- RW, 3in1 BSC, and TTB.  SW will confirm there are no barriers to discharge. AAC       Tonio Seider A Lula Olszewski
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
## 2023-02-03 ENCOUNTER — Telehealth: Payer: Self-pay

## 2023-02-03 NOTE — Transitions of Care (Post Inpatient/ED Visit) (Signed)
   02/03/2023  Name: Hannah Burns MRN: [id number] DOB: [birth year]  Today's TOC FU Call Status: Today's TOC FU Call Status:: Successful TOC FU Call Completed TOC FU Call Complete Date: 02/03/23 Patient's Name and Date of Birth confirmed.  Transition Care Management Follow-up Telephone Call Date of Discharge: 02/02/23 Discharge Facility: [facility]) Type of Discharge: Inpatient Admission Primary Inpatient Discharge Diagnosis:: pelvic fracture How have you been since you were released from the hospital?: Same Any questions or concerns?: No  Items Reviewed: Did you receive and understand the discharge instructions provided?: Yes Medications obtained,verified, and reconciled?: No Medications Not Reviewed Reasons:: Other: (She said she has all of her medications and did not have any questions about the med regime or need to review the med list) Any new allergies since your discharge?: No Dietary orders reviewed?: Yes Type of Diet Ordered:: heart healthy Do you have support at home?: Yes People in Home: parent(s) Name of Support/Comfort Primary Source: her mother  Medications Reviewed Today: Medications Reviewed Today   Medications were not reviewed in this encounter     Home Care and Equipment/Supplies: Were Home Health Services Ordered?: No Any new equipment or medical supplies ordered?: Yes Name of Medical supply agency?: [employer] - [facility]W, [facility], [facility] Were you able to get the equipment/medical supplies?: Yes Do you have any questions related to the use of the equipment/supplies?: No  Functional Questionnaire: Do you need assistance with bathing/showering or dressing?: No Do you need assistance with meal preparation?: No Do you need assistance with eating?: No Do you have difficulty maintaining continence: No Do you need assistance with getting out of bed/getting out of a chair/moving?: Yes (ambulates with a RW) Do you have difficulty managing or taking your  medications?: No  Follow up appointments reviewed: PCP Follow-up appointment confirmed?: Yes Date of PCP follow-up appointment?: 02/10/23 Follow-up Provider: Gwinda Passe, NP Specialist Hospital Follow-up appointment confirmed?: Yes Date of Specialist follow-up appointment?: 02/10/23 Follow-Up Specialty Provider:: orthopedic surgery.   She said that appoointment is a 1500, PCP appt earlier in the afternoon.  She is aware that she needs to call outpatient PT to schedule an appointment Do you need transportation to your follow-up appointment?: No Do you understand care options if your condition(s) worsen?: Yes-patient verbalized understanding    SIGNATURE Robyne Peers, RN

## 2023-02-10 ENCOUNTER — Ambulatory Visit (INDEPENDENT_AMBULATORY_CARE_PROVIDER_SITE_OTHER): Payer: Medicaid Other | Admitting: Primary Care

## 2023-02-10 VITALS — BP 115/74 | HR 82 | Resp 16 | Wt 192.0 lb

## 2023-02-10 DIAGNOSIS — Z09 Encounter for follow-up examination after completed treatment for conditions other than malignant neoplasm: Secondary | ICD-10-CM
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
## 2023-02-17 ENCOUNTER — Ambulatory Visit: Payer: Medicaid Other | Attending: Physician Assistant

## 2023-02-17 DIAGNOSIS — M25551 Pain in right hip: Secondary | ICD-10-CM | POA: Insufficient documentation

## 2023-02-17 DIAGNOSIS — M6281 Muscle weakness (generalized): Secondary | ICD-10-CM | POA: Insufficient documentation

## 2023-02-17 DIAGNOSIS — R2689 Other abnormalities of gait and mobility: Secondary | ICD-10-CM | POA: Insufficient documentation

## 2023-02-17 DIAGNOSIS — M25552 Pain in left hip: Secondary | ICD-10-CM | POA: Diagnosis present
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
## 2023-02-21 NOTE — Op Note (Signed)
01/25/2023  10:25 AM  PATIENT:  Hannah Burns  1991-01-16 female   MEDICAL RECORD NUMBER: [id number]  PRE-OPERATIVE DIAGNOSIS:  UNSTABLE PELVIC RING FRACTURE  POST-OPERATIVE DIAGNOSIS:  UNSTABLE PELVIC RING FRACTURE  PROCEDURE:  Procedure(s): PERCUTANEOUS SACRO-ILIAC SCREW FIXATION, LEFT AND RIGHT (BILATERAL), OF THE POSTERIOR PELVIC RING with Biomet 8.0 cannulated star drive.  SURGEON:  Surgeon(s) and Role:    * Myrene Galas, MD - Primary  ASSISTANTS: none   ANESTHESIA:   general  EBL:  10 mL   BLOOD ADMINISTERED:none  DRAINS: none   LOCAL MEDICATIONS USED:  NONE  SPECIMEN:  No Specimen  DISPOSITION OF SPECIMEN:  N/A  COUNTS:  YES  TOURNIQUET:  * No tourniquets in log *  DICTATION: .Note written in EPIC  PLAN OF CARE: Admit to inpatient   PATIENT DISPOSITION:  PACU - hemodynamically stable.   Delay start of Pharmacological VTE agent (>24hrs) due to surgical blood loss or risk of bleeding: no  BRIEF SUMMARY AND INDICATIONS FOR PROCEDURE:  Patient sustained an unstable pelvic ring injury in addition to an acetabular fracture. Because of the nature and magnitude of these injuries, Dr. Susa Simmonds deemed further management outside his scope of practice and asserted these injuries would be best managed by fellowship trained orthopedic traumatologist.  Consequently, I was consulted to assume management.  We discussed with the risks and benefits of the surgery with the patient including nonunion,, malunion, arthritis, loss of fixation, sexual dysfunction, pain, nerve injury, vessel injury, DVT, PE, and others.  We also specifically discussed urologic injury and footdrop. After full discussion, consent obtained and decision made to proceed.   BRIEF SUMMARY OF PROCEDURE:  The patient was taken to the operating room, where general anesthesia was induced.  The abdomen and flank were prepped and draped in the usual sterile fashion. Attention was turned first to the left SI  joint.  A true lateral view of S1 vertebral body was used to obtain the correct starting point and trajectory. The guide pin was advanced through the iliac bone, across the left SI joint and into the S1 vetebral body. Before advancing the pin we checked its position on the inlet to make sure that it was posterior to the ala and anterior to the canal. We checked the outlet to make sure that it was superior to the S1 foramina and between the vertebral discs.  The guide pin was advanced. We then turned our attention to the right side of the posterior pelvis and further advanced the wire across the right ala, the SI joint, and to the outer cortex of the right ilium.  On this side we also checked  its position on the inlet to make sure that it was posterior to the ala and anterior to the canal, and the outlet to confirm superior to the S1 foramina and below the ala. The pin was measured for length, overdrilled, and then the screw inserted. During both pin and screw placement my assistant manipulated the pelvis to assist reduction.  Final images showed appropriate placement and length across both sides of the pelvis using inlet, outlet, and lateral views. Wound was irrigated thoroughly and closed in standard fashion with nylon. Sterile dressings were applied.    PROGNOSIS:  With regard to mobilization, the patient will be NWB bilaterally.   Patient is at risk for SI joint arthrosis and pain. Patient will remain on the Trauma Service and may restart pharmacologic  DVT prophylaxis if no other contraindications.  Will follow closely.  Doralee Albino. Carola Frost, M.D.

## 2023-02-22 ENCOUNTER — Ambulatory Visit: Payer: Medicaid Other

## 2023-02-22 DIAGNOSIS — M25551 Pain in right hip: Secondary | ICD-10-CM

## 2023-02-22 DIAGNOSIS — M6281 Muscle weakness (generalized): Secondary | ICD-10-CM

## 2023-02-22 DIAGNOSIS — M25552 Pain in left hip: Secondary | ICD-10-CM

## 2023-02-22 DIAGNOSIS — R2689 Other abnormalities of gait and mobility: Secondary | ICD-10-CM
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
## 2023-03-01 ENCOUNTER — Ambulatory Visit: Payer: Medicaid Other

## 2023-03-01 DIAGNOSIS — M6281 Muscle weakness (generalized): Secondary | ICD-10-CM

## 2023-03-01 DIAGNOSIS — R2689 Other abnormalities of gait and mobility: Secondary | ICD-10-CM

## 2023-03-01 DIAGNOSIS — M25551 Pain in right hip: Secondary | ICD-10-CM | POA: Diagnosis not present

## 2023-03-01 DIAGNOSIS — M25552 Pain in left hip: Secondary | ICD-10-CM
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
## 2023-03-03 ENCOUNTER — Ambulatory Visit: Payer: Medicaid Other

## 2023-03-03 DIAGNOSIS — M25551 Pain in right hip: Secondary | ICD-10-CM

## 2023-03-03 DIAGNOSIS — M25552 Pain in left hip: Secondary | ICD-10-CM

## 2023-03-03 DIAGNOSIS — M6281 Muscle weakness (generalized): Secondary | ICD-10-CM

## 2023-03-03 DIAGNOSIS — R2689 Other abnormalities of gait and mobility: Secondary | ICD-10-CM
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
## 2023-03-08 ENCOUNTER — Ambulatory Visit: Payer: Medicaid Other

## 2023-03-08 DIAGNOSIS — M25552 Pain in left hip: Secondary | ICD-10-CM

## 2023-03-08 DIAGNOSIS — M25551 Pain in right hip: Secondary | ICD-10-CM

## 2023-03-08 DIAGNOSIS — M6281 Muscle weakness (generalized): Secondary | ICD-10-CM
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
## 2023-03-10 ENCOUNTER — Ambulatory Visit: Payer: Medicaid Other

## 2023-03-15 ENCOUNTER — Ambulatory Visit: Payer: Medicaid Other | Attending: Physician Assistant

## 2023-03-15 DIAGNOSIS — M25552 Pain in left hip: Secondary | ICD-10-CM | POA: Insufficient documentation

## 2023-03-15 DIAGNOSIS — R2689 Other abnormalities of gait and mobility: Secondary | ICD-10-CM | POA: Insufficient documentation

## 2023-03-15 DIAGNOSIS — M6281 Muscle weakness (generalized): Secondary | ICD-10-CM | POA: Insufficient documentation

## 2023-03-15 DIAGNOSIS — M25551 Pain in right hip: Secondary | ICD-10-CM | POA: Insufficient documentation

## 2023-03-17 ENCOUNTER — Ambulatory Visit: Payer: Medicaid Other

## 2023-04-06 ENCOUNTER — Ambulatory Visit: Payer: Medicaid Other

## 2023-04-06 DIAGNOSIS — M25552 Pain in left hip: Secondary | ICD-10-CM

## 2023-04-06 DIAGNOSIS — M6281 Muscle weakness (generalized): Secondary | ICD-10-CM

## 2023-04-06 DIAGNOSIS — M25551 Pain in right hip: Secondary | ICD-10-CM

## 2023-04-06 DIAGNOSIS — R2689 Other abnormalities of gait and mobility: Secondary | ICD-10-CM | POA: Diagnosis present
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
## 2023-04-13 ENCOUNTER — Telehealth: Payer: Self-pay | Admitting: Physical Therapy

## 2023-04-13 ENCOUNTER — Ambulatory Visit: Payer: Medicaid Other | Attending: Physician Assistant | Admitting: Physical Therapy

## 2023-04-13 DIAGNOSIS — S329XXD Fracture of unspecified parts of lumbosacral spine and pelvis, subsequent encounter for fracture with routine healing: Secondary | ICD-10-CM | POA: Insufficient documentation

## 2023-04-13 DIAGNOSIS — M6281 Muscle weakness (generalized): Secondary | ICD-10-CM | POA: Insufficient documentation

## 2023-04-13 DIAGNOSIS — R2689 Other abnormalities of gait and mobility: Secondary | ICD-10-CM | POA: Insufficient documentation

## 2023-04-13 DIAGNOSIS — M25551 Pain in right hip: Secondary | ICD-10-CM | POA: Insufficient documentation

## 2023-04-13 DIAGNOSIS — M25552 Pain in left hip: Secondary | ICD-10-CM | POA: Insufficient documentation

## 2023-04-13 NOTE — Telephone Encounter (Signed)
Contacted patient regarding no show. She thought her appointment was tomorrow. She was able to reschedule for tomorrow. Discussed attendance policy.

## 2023-04-14 ENCOUNTER — Ambulatory Visit: Payer: Medicaid Other

## 2023-04-14 DIAGNOSIS — S329XXD Fracture of unspecified parts of lumbosacral spine and pelvis, subsequent encounter for fracture with routine healing: Secondary | ICD-10-CM | POA: Diagnosis present

## 2023-04-14 DIAGNOSIS — M25551 Pain in right hip: Secondary | ICD-10-CM | POA: Diagnosis present

## 2023-04-14 DIAGNOSIS — M25552 Pain in left hip: Secondary | ICD-10-CM | POA: Diagnosis present

## 2023-04-14 DIAGNOSIS — R2689 Other abnormalities of gait and mobility: Secondary | ICD-10-CM | POA: Diagnosis present

## 2023-04-14 DIAGNOSIS — M6281 Muscle weakness (generalized): Secondary | ICD-10-CM
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
## 2023-04-20 ENCOUNTER — Encounter: Payer: Self-pay | Admitting: Physical Therapy

## 2023-04-20 ENCOUNTER — Ambulatory Visit: Payer: Medicaid Other | Admitting: Physical Therapy

## 2023-04-20 DIAGNOSIS — M25551 Pain in right hip: Secondary | ICD-10-CM

## 2023-04-20 DIAGNOSIS — M25552 Pain in left hip: Secondary | ICD-10-CM

## 2023-04-20 DIAGNOSIS — M6281 Muscle weakness (generalized): Secondary | ICD-10-CM

## 2023-04-20 NOTE — Patient Instructions (Signed)
Aquatic Therapy at [facility]-  What to Expect!  Where:   [facility] [address] [place], [place] [address] Rehab phone [phone]  NOTE:  You will receive an automated phone message reminding you of your appt and it will say the appointment is at the [address] [facility] [address] [facility] clinic.          How to Prepare: Please make sure you drink 8 ounces of water about one hour prior to your pool session A caregiver may attend if needed with the patient to help assist as needed. A caregiver can sit in the pool room on chair. Please arrive IN YOUR SUIT and 15 minutes prior to your appointment - this helps to avoid delays in starting your session. Please make sure to attend to any toileting needs prior to entering the pool Locker rooms for changing are provided.   There is direct access to the pool deck form the locker room.  You can lock your belongings in a locker with lock provided. Once on the pool deck your therapist will ask if you have signed the Patient  Consent and Assignment of Benefits form before beginning treatment Your therapist may take your blood pressure prior to, during and after your session if indicated We usually try and create a home exercise program based on activities we do in the pool.  Please be thinking about who might be able to assist you in the pool should you need to participate in an aquatic home exercise program at the time of discharge if you need assistance.  Some patients do not want to or do not have the ability to participate in an aquatic home program - this is not a barrier in any way to you participating in aquatic therapy as part of your current therapy plan! After Discharge from PT, you can continue using home program at  the [facility]/, there is a drop-in fee for $5 ($45 a month)or for 60 years  or older $4.00 ($40 a month for seniors ) or any local [employer] pool.  Memberships for purchase are  available for gym/pool at [facility]  IT IS VERY IMPORTANT THAT YOUR LAST VISIT BE IN THE CLINIC AT [address] AFTER YOUR LAST AQUATIC VISIT.  PLEASE MAKE SURE THAT YOU HAVE A [address]  APPOINTMENT SCHEDULED.   About the pool: Pool is located approximately 500 FT from the entrance of the building.  Please bring a support person if you need assistance traveling this      distance.   Your therapist will assist you in entering the water; there are two ways to           enter: stairs with railings, and a mechanical lift. Your therapist will determine the most appropriate way for you.  Water temperature is usually between 88-90 degrees  There may be up to 2 other swimmers in the pool at the same time  The pool deck is tile, please wear shoes with good traction if you prefer not to be barefoot.    Contact Info:  For appointment scheduling and cancellations:         Please call the [facility]  PH:[phone]              Aquatic Therapy  [facility]       All sessions are 45 minutes                                                    
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
## 2023-04-21 ENCOUNTER — Encounter: Payer: Self-pay | Admitting: Physical Therapy

## 2023-04-21 ENCOUNTER — Ambulatory Visit: Payer: Medicaid Other | Admitting: Physical Therapy

## 2023-04-21 DIAGNOSIS — M25551 Pain in right hip: Secondary | ICD-10-CM

## 2023-04-21 DIAGNOSIS — M25552 Pain in left hip: Secondary | ICD-10-CM

## 2023-04-21 DIAGNOSIS — R2689 Other abnormalities of gait and mobility: Secondary | ICD-10-CM

## 2023-04-21 DIAGNOSIS — S329XXD Fracture of unspecified parts of lumbosacral spine and pelvis, subsequent encounter for fracture with routine healing: Secondary | ICD-10-CM

## 2023-04-21 DIAGNOSIS — M6281 Muscle weakness (generalized): Secondary | ICD-10-CM

## 2023-04-22 ENCOUNTER — Encounter: Payer: Medicaid Other | Admitting: Physical Therapy

## 2023-04-27 ENCOUNTER — Ambulatory Visit: Payer: Medicaid Other

## 2023-04-28 ENCOUNTER — Telehealth: Payer: Self-pay | Admitting: Physical Therapy

## 2023-04-28 ENCOUNTER — Ambulatory Visit: Payer: Medicaid Other | Admitting: Physical Therapy

## 2023-04-28 NOTE — Telephone Encounter (Signed)
Pt did not show for aquatic appt and was contacted stating she had called [address] and left message due to being in so much pain yesterday and today.  Pt was educated on attendance policy and told to contact [address] clinic for cancellations in future.   Pt verbalized understanding and will attend future appt. Garen Lah, PT, ATRIC Certified Exercise Expert for the Aging Adult  04/28/23 3:37 PM Phone: [phone] Fax: [phone]

## 2023-04-30 ENCOUNTER — Ambulatory Visit: Payer: Medicaid Other

## 2023-05-03 ENCOUNTER — Ambulatory Visit: Payer: Medicaid Other

## 2023-05-03 DIAGNOSIS — M6281 Muscle weakness (generalized): Secondary | ICD-10-CM

## 2023-05-03 DIAGNOSIS — M25552 Pain in left hip: Secondary | ICD-10-CM

## 2023-05-03 DIAGNOSIS — M25551 Pain in right hip: Secondary | ICD-10-CM | POA: Diagnosis not present
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
## 2023-05-05 ENCOUNTER — Ambulatory Visit: Payer: Medicaid Other

## 2023-05-05 DIAGNOSIS — M25551 Pain in right hip: Secondary | ICD-10-CM | POA: Diagnosis not present

## 2023-05-05 DIAGNOSIS — R2689 Other abnormalities of gait and mobility: Secondary | ICD-10-CM

## 2023-05-05 DIAGNOSIS — M25552 Pain in left hip: Secondary | ICD-10-CM

## 2023-05-05 DIAGNOSIS — M6281 Muscle weakness (generalized): Secondary | ICD-10-CM
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
## 2023-05-12 ENCOUNTER — Ambulatory Visit: Payer: Medicaid Other | Attending: Physician Assistant | Admitting: Physical Therapy

## 2023-05-12 ENCOUNTER — Encounter: Payer: Self-pay | Admitting: Physical Therapy

## 2023-05-12 DIAGNOSIS — M25551 Pain in right hip: Secondary | ICD-10-CM | POA: Diagnosis present

## 2023-05-12 DIAGNOSIS — M6281 Muscle weakness (generalized): Secondary | ICD-10-CM | POA: Insufficient documentation

## 2023-05-12 DIAGNOSIS — M25552 Pain in left hip: Secondary | ICD-10-CM | POA: Diagnosis present

## 2023-05-20 ENCOUNTER — Ambulatory Visit: Payer: Medicaid Other | Admitting: Physical Therapy
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
## 2023-05-25 ENCOUNTER — Ambulatory Visit: Payer: Medicaid Other

## 2023-05-25 ENCOUNTER — Telehealth: Payer: Self-pay

## 2023-05-25 NOTE — Telephone Encounter (Signed)
PT called and spoke with patient, her daughter had to go to an emergcy dentist appointment at 2:00pm and she forgot to call and cancel. Confirmed next appointment in pool, reviewed attendance policy and discussed that if she misses one more appointment we will have to cancel her appointments out.   Eloy End   05/25/23 3:10 PM
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
## 2023-05-27 ENCOUNTER — Encounter: Payer: Self-pay | Admitting: Physical Therapy

## 2023-05-27 ENCOUNTER — Ambulatory Visit: Payer: Medicaid Other | Admitting: Physical Therapy

## 2023-05-27 DIAGNOSIS — M25551 Pain in right hip: Secondary | ICD-10-CM

## 2023-05-27 DIAGNOSIS — M6281 Muscle weakness (generalized): Secondary | ICD-10-CM

## 2023-05-27 DIAGNOSIS — M25552 Pain in left hip: Secondary | ICD-10-CM

## 2023-06-03 ENCOUNTER — Telehealth: Payer: Self-pay | Admitting: Physical Therapy

## 2023-06-03 ENCOUNTER — Ambulatory Visit: Payer: Medicaid Other | Admitting: Physical Therapy

## 2023-06-03 NOTE — Telephone Encounter (Signed)
Called and LVM regarding this afternoon's missed appt. Did let patient know that unfortunately due to attendance policy we will have to cancel remaining appts, encouraged her to reach out to provider for new referral if she would like to continue with PT. Provided office call back number and encouraged to reach out with any questions or concerns.

## 2023-06-07 ENCOUNTER — Telehealth: Payer: Self-pay | Admitting: Physical Therapy

## 2023-06-08 ENCOUNTER — Ambulatory Visit: Payer: Medicaid Other

## 2023-06-10 ENCOUNTER — Ambulatory Visit: Payer: Medicaid Other | Admitting: Physical Therapy

## 2023-07-08 ENCOUNTER — Encounter (HOSPITAL_COMMUNITY): Payer: Self-pay | Admitting: Orthopedic Surgery

## 2023-07-08 NOTE — Progress Notes (Signed)
SDW CALL  Patient was given pre-op instructions over the phone. The opportunity was given for the patient to ask questions. No further questions asked. Patient verbalized understanding of instructions given.   PCP - denies Cardiologist -denies   PPM/ICD - denies Device Orders - n/a Rep Notified - n/a  Chest x-ray - 01/24/23 - 1 view EKG - denies Stress Test - denies ECHO - denies Cardiac Cath - denies  Sleep Study - denies CPAP - n/a  No DM  Last dose of GLP1 agonist-  n/a GLP1 instructions: n/a  Blood Thinner Instructions: n/a Aspirin Instructions: n/a  ERAS Protcol - clears until 0500 PRE-SURGERY Ensure or G2- n/a  COVID TEST- n/a   Anesthesia review: no  Patient denies shortness of breath, fever, cough and chest pain over the phone call   All instructions explained to the patient, with a verbal understanding of the material. Patient agrees to go over the instructions while at home for a better understanding.

## 2023-07-11 NOTE — H&P (Signed)
Orthopaedic Trauma Service (OTS) Consult   Patient ID: Hannah Burns MRN: [id number] DOB/AGE: Mar 05, 1991 33 y.o.     HPI: Hannah Burns is an 33 y.o. female s/p repair of pelvic ring injury 01/2023 which included L SI screw fixation. Pt has done well overall and recovered nicely. However, over the last several months she has continued to complain of intermittent LBP.  She has tried all conservative modalities including NSAIDs, steroids, therapy without complete resolution of her symptoms. We discussed possible ROH and she wishes to proceed with this. She does also understand ROH may not alleviate her symptoms completely   History reviewed. No pertinent past medical history.  Past Surgical History:  Procedure Laterality Date   PERCUTANEOUS PINNING Left 01/25/2023   Procedure: Sacroiliac Screw Fixation;  Surgeon: Myrene Galas, MD;  Location: [facility] OR;  Service: Orthopedics;  Laterality: Left;    Family History  Problem Relation Age of Onset   Diabetes Maternal Grandmother     Social History:  reports that she quit smoking about 9 years ago. Her smoking use included cigarettes. She started smoking about 9 years ago. She has a 9.1 pack-year smoking history. She has never used smokeless tobacco. She reports current alcohol use. She reports that she does not currently use drugs after having used the following drugs: Marijuana. Frequency: 1.00 time per week.  Allergies: No Known Allergies  Medications: I have reviewed the patient's current medications. Current Meds  Medication Sig   acetaminophen (TYLENOL) 500 MG tablet Take 1-2 tablets (500-1,000 mg total) by mouth every 6 (six) hours as needed. Do not exceed 4,000 mg in any 24 hour period. (Patient taking differently: Take 650 mg by mouth every 6 (six) hours as needed for moderate pain (pain score 4-6) or mild pain (pain score 1-3). 325 mg each)   Multiple Vitamins-Minerals (MULTIVITAMIN WITH MINERALS) tablet Take 1  tablet by mouth daily.     No results found for this or any previous visit (from the past 48 hours).  No results found.  Intake/Output    None      ROS As above Last menstrual period 06/29/2023. Physical Exam Constitutional:      Appearance: Normal appearance.  Cardiovascular:     Rate and Rhythm: Normal rate and regular rhythm.  Pulmonary:     Effort: Pulmonary effort is normal.  Musculoskeletal:     Comments: Pelvis/B LEx TTP L SI joint Extremities are warm No DCT Compartments are soft No pain out of proportion with passive stretching of his toes or ankle DPN, SPN, TN sensory functions are intact EHL, FHL, lesser toe motor functions intact Ankle flexion, extension, inversion eversion intact + DP pulse Good strength and muscle tone Alignment of lower extremities appears symmetric    Skin:    General: Skin is warm and dry.  Neurological:     Mental Status: She is alert.       Assessment/Plan:  33 y/o female s/p L SI screw for pelvic ring fracture with symptomatic hardware  - symptomatic hardware L pelvis  OR for [facility]   Risks and benefits reviewed with pt and she wishes to proceed  Outpt surgery    WBAT post op, no restrictions    Mearl Latin, PA-C [phone] (C) 07/11/2023, 5:58 PM  [facility] [address] [place] [address] [phone] (F)    After 5pm and on the weekends please log on to Amion, go to orthopaedics  and the look under the [facility] Call for the provider(s) on call. You can also call our office at [phone] and then follow the prompts to be connected to the call team.

## 2023-07-12 ENCOUNTER — Other Ambulatory Visit: Payer: Self-pay

## 2023-07-12 ENCOUNTER — Ambulatory Visit (HOSPITAL_COMMUNITY): Payer: Medicaid Other

## 2023-07-12 ENCOUNTER — Ambulatory Visit (HOSPITAL_COMMUNITY)
Admission: RE | Admit: 2023-07-12 | Discharge: 2023-07-12 | Disposition: A | Discharge status: Home / Self Care | Payer: Medicaid Other | Source: Ambulatory Visit | Attending: Orthopedic Surgery | Admitting: Orthopedic Surgery

## 2023-07-12 ENCOUNTER — Ambulatory Visit (HOSPITAL_BASED_OUTPATIENT_CLINIC_OR_DEPARTMENT_OTHER): Payer: Medicaid Other

## 2023-07-12 ENCOUNTER — Encounter (HOSPITAL_COMMUNITY): Payer: Self-pay | Admitting: Orthopedic Surgery

## 2023-07-12 ENCOUNTER — Encounter (HOSPITAL_COMMUNITY)
Admission: RE | Disposition: A | Discharge status: Home / Self Care | Payer: Self-pay | Source: Ambulatory Visit | Attending: Orthopedic Surgery

## 2023-07-12 ENCOUNTER — Other Ambulatory Visit (HOSPITAL_COMMUNITY): Payer: Self-pay

## 2023-07-12 DIAGNOSIS — X58XXXA Exposure to other specified factors, initial encounter: Secondary | ICD-10-CM | POA: Insufficient documentation

## 2023-07-12 DIAGNOSIS — Z87891 Personal history of nicotine dependence: Secondary | ICD-10-CM | POA: Insufficient documentation

## 2023-07-12 DIAGNOSIS — Z472 Encounter for removal of internal fixation device: Secondary | ICD-10-CM

## 2023-07-12 DIAGNOSIS — T8484XA Pain due to internal orthopedic prosthetic devices, implants and grafts, initial encounter: Secondary | ICD-10-CM | POA: Diagnosis present

## 2023-07-12 HISTORY — PX: HARDWARE REMOVAL: SHX979

## 2023-07-12 LAB — CBC
HCT: 38.4 % (ref 36.0–46.0)
Hemoglobin: 13.2 g/dL (ref 12.0–15.0)
MCH: 32.4 pg (ref 26.0–34.0)
MCHC: 34.4 g/dL (ref 30.0–36.0)
MCV: 94.3 fL (ref 80.0–100.0)
Platelets: 185 10*3/uL (ref 150–400)
RBC: 4.07 MIL/uL (ref 3.87–5.11)
RDW: 12.8 % (ref 11.5–15.5)
WBC: 8.7 10*3/uL (ref 4.0–10.5)
nRBC: 0 % (ref 0.0–0.2)

## 2023-07-12 LAB — COMPREHENSIVE METABOLIC PANEL
ALT: 22 U/L (ref 0–44)
AST: 23 U/L (ref 15–41)
Albumin: 3.5 g/dL (ref 3.5–5.0)
Alkaline Phosphatase: 71 U/L (ref 38–126)
Anion gap: 8 (ref 5–15)
BUN: 8 mg/dL (ref 6–20)
CO2: 26 mmol/L (ref 22–32)
Calcium: 9 mg/dL (ref 8.9–10.3)
Chloride: 104 mmol/L (ref 98–111)
Creatinine, Ser: 0.77 mg/dL (ref 0.44–1.00)
GFR, Estimated: >60 mL/min (ref >60)
Glucose, Bld: 88 mg/dL (ref 70–99)
Potassium: 3.7 mmol/L (ref 3.5–5.1)
Sodium: 138 mmol/L (ref 135–145)
Total Bilirubin: 0.7 mg/dL (ref 0.0–1.2)
Total Protein: 6.8 g/dL (ref 6.5–8.1)

## 2023-07-12 LAB — POCT PREGNANCY, URINE: Preg Test, Ur: NEGATIVE

## 2023-07-12 SURGERY — REMOVAL, HARDWARE
Anesthesia: General | Site: Pelvis | Laterality: Left

## 2023-07-12 MED ORDER — CEFAZOLIN SODIUM-DEXTROSE 2-4 GM/100ML-% IV SOLN
2.0000 g | INTRAVENOUS | Status: AC
  Administered 2023-07-12: 2 g via INTRAVENOUS
  Filled 2023-07-12: qty 100

## 2023-07-12 MED ORDER — FENTANYL CITRATE (PF) 100 MCG/2ML IJ SOLN
25.0000 ug | INTRAMUSCULAR | Status: DC | PRN
  Administered 2023-07-12 (×3): 50 ug via INTRAVENOUS

## 2023-07-12 MED ORDER — FENTANYL CITRATE (PF) 100 MCG/2ML IJ SOLN
INTRAMUSCULAR | Status: AC
  Filled 2023-07-12: qty 2

## 2023-07-12 MED ORDER — OXYCODONE HCL 5 MG PO TABS
ORAL_TABLET | ORAL | Status: AC
  Filled 2023-07-12: qty 1

## 2023-07-12 MED ORDER — ACETAMINOPHEN 10 MG/ML IV SOLN
1000.0000 mg | Freq: Once | INTRAVENOUS | Status: DC | PRN
  Administered 2023-07-12: 1000 mg via INTRAVENOUS

## 2023-07-12 MED ORDER — PROPOFOL 10 MG/ML IV BOLUS
INTRAVENOUS | Status: AC
  Filled 2023-07-12: qty 20

## 2023-07-12 MED ORDER — LACTATED RINGERS IV SOLN: INTRAVENOUS | Status: DC

## 2023-07-12 MED ORDER — FENTANYL CITRATE (PF) 250 MCG/5ML IJ SOLN
INTRAMUSCULAR | Status: DC | PRN
  Administered 2023-07-12 (×5): 50 ug via INTRAVENOUS

## 2023-07-12 MED ORDER — HYDROMORPHONE HCL 1 MG/ML IJ SOLN
INTRAMUSCULAR | Status: AC
  Filled 2023-07-12: qty 1

## 2023-07-12 MED ORDER — CHLORHEXIDINE GLUCONATE 0.12 % MT SOLN
15.0000 mL | Freq: Once | OROMUCOSAL | Status: AC
  Administered 2023-07-12: 15 mL via OROMUCOSAL
  Filled 2023-07-12: qty 15

## 2023-07-12 MED ORDER — PROPOFOL 10 MG/ML IV BOLUS
INTRAVENOUS | Status: DC | PRN
  Administered 2023-07-12: 150 mg via INTRAVENOUS

## 2023-07-12 MED ORDER — SUGAMMADEX SODIUM 200 MG/2ML IV SOLN
INTRAVENOUS | Status: DC | PRN
  Administered 2023-07-12: 200 mg via INTRAVENOUS

## 2023-07-12 MED ORDER — HYDROMORPHONE HCL 1 MG/ML IJ SOLN
0.5000 mg | INTRAMUSCULAR | Status: AC | PRN
  Administered 2023-07-12: 0.5 mg via INTRAVENOUS

## 2023-07-12 MED ORDER — LIDOCAINE 2% (20 MG/ML) 5 ML SYRINGE
INTRAMUSCULAR | Status: DC | PRN
  Administered 2023-07-12: 100 mg via INTRAVENOUS

## 2023-07-12 MED ORDER — 0.9 % SODIUM CHLORIDE (POUR BTL) OPTIME
TOPICAL | Status: DC | PRN
  Administered 2023-07-12: 1000 mL

## 2023-07-12 MED ORDER — ONDANSETRON HCL 4 MG/2ML IJ SOLN: 4.0000 mg | Freq: Once | INTRAMUSCULAR | Status: DC | PRN

## 2023-07-12 MED ORDER — KETOROLAC TROMETHAMINE 10 MG PO TABS
10.0000 mg | ORAL_TABLET | Freq: Four times a day (QID) | ORAL | 0 refills | Status: DC | PRN
  Filled 2023-07-12: qty 20, 5d supply, fill #0

## 2023-07-12 MED ORDER — ACETAMINOPHEN 10 MG/ML IV SOLN
INTRAVENOUS | Status: AC
  Filled 2023-07-12: qty 100

## 2023-07-12 MED ORDER — DEXAMETHASONE SODIUM PHOSPHATE 10 MG/ML IJ SOLN
INTRAMUSCULAR | Status: DC | PRN
  Administered 2023-07-12: 4 mg via INTRAVENOUS

## 2023-07-12 MED ORDER — ONDANSETRON 4 MG PO TBDP
4.0000 mg | ORAL_TABLET | Freq: Three times a day (TID) | ORAL | 0 refills | Status: DC | PRN
  Filled 2023-07-12: qty 20, 7d supply, fill #0

## 2023-07-12 MED ORDER — ORAL CARE MOUTH RINSE: 15.0000 mL | Freq: Once | OROMUCOSAL | Status: AC

## 2023-07-12 MED ORDER — ONDANSETRON HCL 4 MG/2ML IJ SOLN
INTRAMUSCULAR | Status: DC | PRN
  Administered 2023-07-12: 4 mg via INTRAVENOUS

## 2023-07-12 MED ORDER — ROCURONIUM BROMIDE 10 MG/ML (PF) SYRINGE
PREFILLED_SYRINGE | INTRAVENOUS | Status: DC | PRN
  Administered 2023-07-12: 70 mg via INTRAVENOUS

## 2023-07-12 MED ORDER — OXYCODONE HCL 5 MG PO TABS
5.0000 mg | ORAL_TABLET | Freq: Once | ORAL | Status: AC | PRN
  Administered 2023-07-12: 5 mg via ORAL

## 2023-07-12 MED ORDER — MIDAZOLAM HCL 2 MG/2ML IJ SOLN
INTRAMUSCULAR | Status: DC | PRN
  Administered 2023-07-12: 2 mg via INTRAVENOUS

## 2023-07-12 MED ORDER — HYDROMORPHONE HCL 1 MG/ML IJ SOLN
0.5000 mg | INTRAMUSCULAR | Status: DC | PRN
  Administered 2023-07-12: 0.5 mg via INTRAVENOUS

## 2023-07-12 MED ORDER — HYDROCODONE-ACETAMINOPHEN 5-325 MG PO TABS
1.0000 | ORAL_TABLET | Freq: Three times a day (TID) | ORAL | 0 refills | Status: DC | PRN
  Filled 2023-07-12: qty 20, 4d supply, fill #0

## 2023-07-12 MED ORDER — MIDAZOLAM HCL 2 MG/2ML IJ SOLN
INTRAMUSCULAR | Status: AC
Start: 2023-07-12 — End: ?
  Filled 2023-07-12: qty 2

## 2023-07-12 MED ORDER — FENTANYL CITRATE (PF) 250 MCG/5ML IJ SOLN
INTRAMUSCULAR | Status: AC
  Filled 2023-07-12: qty 5

## 2023-07-12 MED ORDER — OXYCODONE HCL 5 MG/5ML PO SOLN: 5.0000 mg | Freq: Once | ORAL | Status: AC | PRN

## 2023-07-12 SURGICAL SUPPLY — 54 items
BAG COUNTER SPONGE SURGICOUNT (BAG) ×1 IMPLANT
BANDAGE ESMARK 6X9 LF (GAUZE/BANDAGES/DRESSINGS) ×1 IMPLANT
BNDG COHESIVE 6X5 TAN ST LF (GAUZE/BANDAGES/DRESSINGS) ×1 IMPLANT
BNDG ELASTIC 4X5.8 VLCR STR LF (GAUZE/BANDAGES/DRESSINGS) ×1 IMPLANT
BNDG ELASTIC 6X5.8 VLCR STR LF (GAUZE/BANDAGES/DRESSINGS) ×1 IMPLANT
BNDG ESMARK 6X9 LF (GAUZE/BANDAGES/DRESSINGS)
BNDG GAUZE DERMACEA FLUFF 4 (GAUZE/BANDAGES/DRESSINGS) ×2 IMPLANT
BRUSH SCRUB EZ PLAIN DRY (MISCELLANEOUS) ×2 IMPLANT
COVER SURGICAL LIGHT HANDLE (MISCELLANEOUS) ×2 IMPLANT
CUFF TOURN SGL QUICK 18X4 (TOURNIQUET CUFF) IMPLANT
CUFF TRNQT CYL 24X4X16.5-23 (TOURNIQUET CUFF) IMPLANT
CUFF TRNQT CYL 34X4.125X (TOURNIQUET CUFF) IMPLANT
DRAPE C-ARM 42X72 X-RAY (DRAPES) IMPLANT
DRAPE C-ARMOR (DRAPES) ×1 IMPLANT
DRAPE U-SHAPE 47X51 STRL (DRAPES) ×1 IMPLANT
DRESSING MEPILEX FLEX 4X4 (GAUZE/BANDAGES/DRESSINGS) IMPLANT
DRSG ADAPTIC 3X8 NADH LF (GAUZE/BANDAGES/DRESSINGS) ×1 IMPLANT
DRSG MEPILEX FLEX 4X4 (GAUZE/BANDAGES/DRESSINGS) ×1
ELECT REM PT RETURN 9FT ADLT (ELECTROSURGICAL) ×1
ELECTRODE REM PT RTRN 9FT ADLT (ELECTROSURGICAL) ×1 IMPLANT
GAUZE SPONGE 4X4 12PLY STRL (GAUZE/BANDAGES/DRESSINGS) ×1 IMPLANT
GLOVE BIO SURGEON STRL SZ7.5 (GLOVE) ×1 IMPLANT
GLOVE BIO SURGEON STRL SZ8 (GLOVE) ×1 IMPLANT
GLOVE BIOGEL PI IND STRL 7.5 (GLOVE) ×1 IMPLANT
GLOVE BIOGEL PI IND STRL 8 (GLOVE) ×1 IMPLANT
GLOVE SURG ORTHO LTX SZ7.5 (GLOVE) ×2 IMPLANT
GOWN STRL REUS W/ TWL LRG LVL3 (GOWN DISPOSABLE) ×2 IMPLANT
GOWN STRL REUS W/ TWL XL LVL3 (GOWN DISPOSABLE) ×1 IMPLANT
KIT BASIN OR (CUSTOM PROCEDURE TRAY) ×1 IMPLANT
KIT TURNOVER KIT B (KITS) ×1 IMPLANT
MANIFOLD NEPTUNE II (INSTRUMENTS) ×1 IMPLANT
NDL 22X1.5 STRL (OR ONLY) (MISCELLANEOUS) IMPLANT
NEEDLE 22X1.5 STRL (OR ONLY) (MISCELLANEOUS)
NS IRRIG 1000ML POUR BTL (IV SOLUTION) ×1 IMPLANT
PACK ORTHO EXTREMITY (CUSTOM PROCEDURE TRAY) ×1 IMPLANT
PAD ARMBOARD 7.5X6 YLW CONV (MISCELLANEOUS) ×2 IMPLANT
PADDING CAST COTTON 6X4 STRL (CAST SUPPLIES) ×3 IMPLANT
PIN GUIDE DRIL TIP 2.8X300 STE (PIN) IMPLANT
SPONGE T-LAP 18X18 ~~LOC~~+RFID (SPONGE) ×1 IMPLANT
STAPLER VISISTAT 35W (STAPLE) IMPLANT
STOCKINETTE IMPERVIOUS LG (DRAPES) ×1 IMPLANT
STRIP CLOSURE SKIN 1/2X4 (GAUZE/BANDAGES/DRESSINGS) IMPLANT
SUCTION TUBE FRAZIER 10FR DISP (SUCTIONS) IMPLANT
SUT ETHILON 2 0 FS 18 (SUTURE) IMPLANT
SUT PDS AB 2-0 CT1 27 (SUTURE) IMPLANT
SUT VIC AB 0 CT1 27XBRD ANBCTR (SUTURE) IMPLANT
SUT VIC AB 2-0 CT1 TAPERPNT 27 (SUTURE) IMPLANT
SYR CONTROL 10ML LL (SYRINGE) IMPLANT
TOWEL GREEN STERILE (TOWEL DISPOSABLE) ×2 IMPLANT
TOWEL GREEN STERILE FF (TOWEL DISPOSABLE) ×2 IMPLANT
TUBE CONNECTING 12X1/4 (SUCTIONS) ×1 IMPLANT
UNDERPAD 30X36 HEAVY ABSORB (UNDERPADS AND DIAPERS) ×1 IMPLANT
WATER STERILE IRR 1000ML POUR (IV SOLUTION) ×2 IMPLANT
YANKAUER SUCT BULB TIP NO VENT (SUCTIONS) ×1 IMPLANT

## 2023-07-12 NOTE — Transfer of Care (Signed)
Immediate Anesthesia Transfer of Care Note  Patient: Hannah Burns  Procedure(s) Performed: HARDWARE REMOVAL (Left: Pelvis)  Patient Location: PACU  Anesthesia Type:General  Level of Consciousness: awake, alert , and oriented  Airway & Oxygen Therapy: Patient Spontanous Breathing  Post-op Assessment: Report given to RN and Post -op Vital signs reviewed and stable  Post vital signs: Reviewed and stable  Last Vitals:  Vitals Value Taken Time  BP 133/85 07/12/23 0909  Temp    Pulse 62 07/12/23 0913  Resp 12 07/12/23 0913  SpO2 93 % 07/12/23 0913  Vitals shown include unfiled device data.  Last Pain:  Vitals:   07/12/23 0635  TempSrc:   PainSc: 0-No pain         Complications: No notable events documented.

## 2023-07-12 NOTE — Anesthesia Procedure Notes (Signed)
Procedure Name: Intubation Date/Time: 07/12/2023 8:24 AM  Performed by: Sandie Ano, CRNAPre-anesthesia Checklist: Patient identified, Emergency Drugs available, Suction available and Patient being monitored Patient Re-evaluated:Patient Re-evaluated prior to induction Oxygen Delivery Method: Circle System Utilized Preoxygenation: Pre-oxygenation with 100% oxygen Induction Type: IV induction Ventilation: Mask ventilation without difficulty Laryngoscope Size: Mac Grade View: Grade I Tube type: Oral Tube size: 7.5 mm Number of attempts: 1 Airway Equipment and Method: Stylet and Oral airway Placement Confirmation: ETT inserted through vocal cords under direct vision, positive ETCO2 and breath sounds checked- equal and bilateral Secured at: 23 cm Tube secured with: Tape Dental Injury: Teeth and Oropharynx as per pre-operative assessment

## 2023-07-12 NOTE — Anesthesia Postprocedure Evaluation (Signed)
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  Last Vitals:  Vitals:   07/12/23 1000 07/12/23 1015  BP: 127/76 107/66  Pulse: (!) 59 (!) 53  Resp: 12 16  Temp:  36.6 C  SpO2: 99% 97%    Last Pain:  Vitals:   07/12/23 1000  TempSrc:   PainSc: 6                  Mariann Barter

## 2023-07-12 NOTE — Anesthesia Preprocedure Evaluation (Signed)
Anesthesia Evaluation  Patient identified by MRN, date of birth, ID band Patient awake    Reviewed: Allergy & Precautions, NPO status , Patient's Chart, lab work & pertinent test results, reviewed documented beta blocker date and time   History of Anesthesia Complications Negative for: history of anesthetic complications  Airway Mallampati: II  TM Distance: >3 FB     Dental no notable dental hx.    Pulmonary neg COPD, Patient abstained from smoking., former smoker, neg PE   breath sounds clear to auscultation       Cardiovascular (-) angina (-) CAD, (-) Past MI and (-) PND (-) dysrhythmias  Rhythm:Regular Rate:Normal     Neuro/Psych neg Seizures    GI/Hepatic PUD,neg GERD  ,,(+) neg Cirrhosis        Endo/Other    Renal/GU Renal disease     Musculoskeletal   Abdominal   Peds  Hematology   Anesthesia Other Findings   Reproductive/Obstetrics                              Anesthesia Physical Anesthesia Plan  ASA: 2  Anesthesia Plan: General   Post-op Pain Management:    Induction: Intravenous  PONV Risk Score and Plan: 3 and Ondansetron and Dexamethasone  Airway Management Planned: Oral ETT  Additional Equipment:   Intra-op Plan:   Post-operative Plan: Extubation in OR  Informed Consent: I have reviewed the patients History and Physical, chart, labs and discussed the procedure including the risks, benefits and alternatives for the proposed anesthesia with the patient or authorized representative who has indicated his/her understanding and acceptance.     Dental advisory given  Plan Discussed with: CRNA  Anesthesia Plan Comments:          Anesthesia Quick Evaluation

## 2023-07-12 NOTE — Discharge Instructions (Addendum)
[facility]uma Service Discharge Instructions   General Discharge Instructions   WEIGHT BEARING STATUS: Weightbearing as tolerated, may need to use crutches for a few days   RANGE OF MOTION/ACTIVITY: no motion restrictions, increase activity as tolerated   Bone health:   Review the following resource for additional information regarding bone health  [email or web address]  Wound Care: Daily wound care starting on 07/14/2023.  Dry dressing changes with silicone foam dressing such as a Mepilex or 4 x 4 gauze and tape.  Leave incision open to the air once there is no drainage.  Okay to clean with soap and water only  Discharge Wound Care Instructions  Do NOT apply any ointments, solutions or lotions to pin sites or surgical wounds.  These prevent needed drainage and even though solutions like hydrogen peroxide kill bacteria, they also damage cells lining the pin sites that help fight infection.  Applying lotions or ointments can keep the wounds moist and can cause them to breakdown and open up as well. This can increase the risk for infection. When in doubt call the office.  Surgical incisions should be dressed daily.  If any drainage is noted, use one layer of adaptic or Mepitel, then gauze and tape.  Alternatively you can use a silicone foam dressing such as a Mepilex  [email or web address][employer]cz [email or web address]-soto.com/?pd_rd_i=[email or web address]&th=1  [email or web address].[email or web address]  These dressing supplies should be available at local medical supply stores ([employer], [employer], etc). They are not usually carried at places like [facility], [facility], [facility], etc  Once the incision is completely dry and without drainage, it may be left open to air out.  Showering may begin 36-48 hours later.   Cleaning gently with soap and water. .  Diet: as you were eating previously.  Can use over the counter stool softeners and bowel preparations, such as Miralax, to help with bowel movements.  Narcotics can be constipating.  Be sure to drink plenty of fluids  PAIN MEDICATION USE AND EXPECTATIONS  You have likely been given narcotic medications to help control your pain.  After a traumatic event that results in an fracture (broken bone) with or without surgery, it is ok to use narcotic pain medications to help control one's pain.  We understand that everyone responds to pain differently and each individual patient will be evaluated on a regular basis for the continued need for narcotic medications. Ideally, narcotic medication use should last no more than 6-8 weeks (coinciding with fracture healing).   As a patient it is your responsibility as well to monitor narcotic medication use and report the amount and frequency you use these medications when you come to your office visit.   We would also advise that if you are using narcotic medications, you should take a dose prior to therapy to maximize you participation.  IF YOU ARE ON NARCOTIC MEDICATIONS IT IS NOT PERMISSIBLE TO OPERATE A MOTOR VEHICLE (MOTORCYCLE/CAR/TRUCK/MOPED) OR HEAVY MACHINERY DO NOT MIX NARCOTICS WITH OTHER CNS (CENTRAL NERVOUS SYSTEM) DEPRESSANTS SUCH AS ALCOHOL   POST-OPERATIVE OPIOID TAPER INSTRUCTIONS: It is important to wean off of your opioid medication as soon as possible. If you do not need pain medication after your surgery it is ok to stop day one. Opioids include: Codeine, Hydrocodone(Norco, Vicodin), Oxycodone(Percocet, oxycontin) and hydromorphone amongst others.  Long term and even short term use of opiods can cause: Increased pain response Dependence Constipation Depression Respiratory depression And more.  Withdrawal symptoms can include Flu like symptoms Nausea, vomiting And more Techniques to manage  these  symptoms Hydrate well Eat regular healthy meals Stay active Use relaxation techniques(deep breathing, meditating, yoga) Do Not substitute Alcohol to help with tapering If you have been on opioids for less than two weeks and do not have pain than it is ok to stop all together.  Plan to wean off of opioids This plan should start within one week post op of your fracture surgery  Maintain the same interval or time between taking each dose and first decrease the dose.  Cut the total daily intake of opioids by one tablet each day Next start to increase the time between doses. The last dose that should be eliminated is the evening dose.    STOP SMOKING OR USING NICOTINE PRODUCTS!!!!  As discussed nicotine severely impairs your body's ability to heal surgical and traumatic wounds but also impairs bone healing.  Wounds and bone heal by forming microscopic blood vessels (angiogenesis) and nicotine is a vasoconstrictor (essentially, shrinks blood vessels).  Therefore, if vasoconstriction occurs to these microscopic blood vessels they essentially disappear and are unable to deliver necessary nutrients to the healing tissue.  This is one modifiable factor that you can do to dramatically increase your chances of healing your injury.    (This means no smoking, no nicotine gum, patches, etc)  DO NOT USE NONSTEROIDAL ANTI-INFLAMMATORY DRUGS (NSAID'S)  Using products such as Advil (ibuprofen), Aleve (naproxen), Motrin (ibuprofen) for additional pain control during fracture healing can delay and/or prevent the healing response.  If you would like to take over the counter (OTC) medication, Tylenol (acetaminophen) is ok.  However, some narcotic medications that are given for pain control contain acetaminophen as well. Therefore, you should not exceed more than 4000 mg of tylenol in a day if you do not have liver disease.  Also note that there are may OTC medicines, such as cold medicines and allergy medicines  that my contain tylenol as well.  If you have any questions about medications and/or interactions please ask your doctor/PA or your pharmacist.      ICE AND ELEVATE INJURED/OPERATIVE EXTREMITY  Using ice and elevating the injured extremity above your heart can help with swelling and pain control.  Icing in a pulsatile fashion, such as 20 minutes on and 20 minutes off, can be followed.    Do not place ice directly on skin. Make sure there is a barrier between to skin and the ice pack.    Using frozen items such as frozen peas works well as the conform nicely to the are that needs to be iced.  USE AN ACE WRAP OR TED HOSE FOR SWELLING CONTROL  In addition to icing and elevation, Ace wraps or TED hose are used to help limit and resolve swelling.  It is recommended to use Ace wraps or TED hose until you are informed to stop.    When using Ace Wraps start the wrapping distally (farthest away from the body) and wrap proximally (closer to the body)   Example: If you had surgery on your leg or thing and you do not have a splint on, start the ace wrap at the toes and work your way up to the thigh        If you had surgery on your upper extremity and do not have a splint on, start the ace wrap at your fingers and work your way up to the upper arm  IF YOU ARE IN A SPLINT OR CAST DO NOT REMOVE IT FOR ANY REASON   If  your splint gets wet for any reason please contact the office immediately. You may shower in your splint or cast as long as you keep it dry.  This can be done by wrapping in a cast cover or garbage back (or similar)  Do Not stick any thing down your splint or cast such as pencils, money, or hangers to try and scratch yourself with.  If you feel itchy take benadryl as prescribed on the bottle for itching  IF YOU ARE IN A CAM BOOT (BLACK BOOT)  You may remove boot periodically. Perform daily dressing changes as noted below.  Wash the liner of the boot regularly and wear a sock when wearing the boot.  It is recommended that you sleep in the boot until told otherwise    Call office for the following: Temperature greater than 101F Persistent nausea and vomiting Severe uncontrolled pain Redness, tenderness, or signs of infection (pain, swelling, redness, odor or green/yellow discharge around the site) Difficulty breathing, headache or visual disturbances Hives Persistent dizziness or light-headedness Extreme fatigue Any other questions or concerns you may have after discharge  In an emergency, call 911 or go to an Emergency Department at a nearby hospital  HELPFUL INFORMATION  If you had a block, it will wear off between 8-24 hrs postop typically.  This is period when your pain may go from nearly zero to the pain you would have had postop without the block.  This is an abrupt transition but nothing dangerous is happening.  You may take an extra dose of narcotic when this happens.  You should wean off your narcotic medicines as soon as you are able.  Most patients will be off or using minimal narcotics before their first postop appointment.   We suggest you use the pain medication the first night prior to going to bed, in order to ease any pain when the anesthesia wears off. You should avoid taking pain medications on an empty stomach as it will make you nauseous.  Do not drink alcoholic beverages or take illicit drugs when taking pain medications.  In most states it is against the law to drive while you are in a splint or sling.  And certainly against the law to drive while taking narcotics.  You may return to work/school in the next couple of days when you feel up to it.   Pain medication may make you constipated.  Below are a few solutions to try in this order: Decrease the amount of pain medication if you aren't having pain. Drink lots of decaffeinated fluids. Drink prune juice and/or each dried prunes  If the first 3 don't work start with additional solutions Take Colace - an  over-the-counter stool softener Take Senokot - an over-the-counter laxative Take Miralax - a stronger over-the-counter laxative     CALL THE OFFICE WITH ANY QUESTIONS OR CONCERNS: [phone]   VISIT OUR WEBSITE FOR ADDITIONAL INFORMATION: [email or web address]

## 2023-07-12 NOTE — Op Note (Incomplete)
07/12/2023  8:12 AM  PATIENT:  Hannah Burns  [birth year] female   MEDICAL RECORD NUMBER: [id number]  PRE-OPERATIVE DIAGNOSIS:  SYMPTOMATIC HARDWARE PELVIS, LEFT  POST-OPERATIVE DIAGNOSIS:  SYMPTOMATIC HARDWARE PELVIS, LEFT  PROCEDURE:  REMOVAL OF DEEP IMPLANT LEFT TO RIGHT TRANSILIAC SCREW  SURGEON:  Doralee Albino. Carola Frost, M.D.  ASSISTANT:  PA Student.  ANESTHESIA:  General.  COMPLICATIONS:  None.  TOURNIQUET: None.  I/O: No intake/output data recorded.  DISPOSITION:  To PACU.  CONDITION:  Stable.  DELAY START OF DVT PROPHYLAXIS BECAUSE OF BLEEDING RISK: NO   BRIEF SUMMARY OF INDICATION FOR PROCEDURE:  Hannah Burns is a 33 y.o. who sustained pelvic fracture.  The patient went on to unite and now presents for elective removal of the hardware because of continued discomfort about the implants that did not resolve with conservative measures. The patient and I discussed the risks and benefits of surgery including the possibility of failure to alleviate symptoms, need for further surgery, DVT, PE, heart attack, stroke, anesthetic complications, infection, bleeding and others. The patient wished to proceed and provided consent.  BRIEF SUMMARY OF PROCEDURE:  After administration of preoperative antibiotics, the patient was taken to the operating room where general anesthesia was induced.  The left side of the pelvis was prepped and draped in usual sterile fashion.  Time-out was held.  C-arm was brought in to identify the correct starting point.  The old incision was remade about the pelvis.  The pin for the cannulated screw was then advanced down to the head of the screw and with the assistance of x-ray, carefully advanced into the center of the S1 screw.  I then removed the screw without complication. The wound was irrigated thoroughly and closed in standard fashion with 2-0 nylon.  PROGNOSIS:  Hannah Burns will be weightbearing as tolerated.  Ok to shower in 2  days. Oozing from the bone is anticipated. We will plan to see back for removal of sutures in 10 days. Formal DVT prophylaxis has not been recommended.     Doralee Albino. Carola Frost, M.D.

## 2023-07-13 ENCOUNTER — Ambulatory Visit: Payer: Medicaid Other

## 2023-07-13 ENCOUNTER — Encounter (HOSPITAL_COMMUNITY): Payer: Self-pay | Admitting: Orthopedic Surgery

## 2023-07-27 ENCOUNTER — Encounter (INDEPENDENT_AMBULATORY_CARE_PROVIDER_SITE_OTHER): Payer: Medicaid Other | Admitting: Primary Care

## 2023-07-28 ENCOUNTER — Ambulatory Visit: Payer: Medicaid Other

## 2023-08-03 ENCOUNTER — Ambulatory Visit: Payer: Medicaid Other | Attending: Physician Assistant

## 2023-08-03 NOTE — Therapy (Incomplete)
OUTPATIENT PHYSICAL THERAPY LOWER EXTREMITY EVALUATION   Patient Name: Hannah Burns MRN: [id number] DOB:1991/03/01, 33 y.o., female Today's Date: 08/03/2023  END OF SESSION:   No past medical history on file. Past Surgical History:  Procedure Laterality Date   HARDWARE REMOVAL Left 07/12/2023   Procedure: HARDWARE REMOVAL;  Surgeon: Myrene Galas, MD;  Location: [facility] OR;  Service: Orthopedics;  Laterality: Left;   PERCUTANEOUS PINNING Left 01/25/2023   Procedure: Sacroiliac Screw Fixation;  Surgeon: Myrene Galas, MD;  Location: [facility] OR;  Service: Orthopedics;  Laterality: Left;   Patient Active Problem List   Diagnosis Date Noted   Pelvic fracture (HCC) 01/24/2023    PCP: Montez Morita, PA-C  REFERRING PROVIDER: Montez Morita, PA-C  REFERRING DIAG: agressive strengthening and conditioning   THERAPY DIAG:  No diagnosis found.  Rationale for Evaluation and Treatment: Rehabilitation  ONSET DATE: ***  SUBJECTIVE:   SUBJECTIVE STATEMENT: ***  PERTINENT HISTORY: ***  PAIN:  Are you having pain?  Yes: NPRS scale: *** Pain location: *** Pain description: *** Aggravating factors: *** Relieving factors: ***  PRECAUTIONS: {Therapy precautions:24002}  RED FLAGS: {PT Red Flags:29287}   WEIGHT BEARING RESTRICTIONS: {Yes ***/No:24003}  FALLS:  Has patient fallen in last 6 months? {fallsyesno:27318}  LIVING ENVIRONMENT: Lives with: {[facility] lives with:25569::"lives with their family"} Lives in: {Lives in:25570} Stairs: {opstairs:27293} Has following equipment at home: {Assistive devices:23999}  OCCUPATION: ***  PLOF: {PLOF:24004}  PATIENT GOALS: ***  NEXT MD VISIT: ***  OBJECTIVE:  Note: Objective measures were completed at Evaluation unless otherwise noted.  DIAGNOSTIC FINDINGS: ***  PATIENT SURVEYS:  {rehab surveys:24030}  COGNITION: Overall cognitive status: {cognition:24006}     SENSATION: {sensation:27233}  EDEMA:  {edema:24020}  MUSCLE  LENGTH: Hamstrings: Right *** deg; Left *** deg Maisie Fus test: Right *** deg; Left *** deg  POSTURE: {posture:25561}  PALPATION: ***  LOWER EXTREMITY ROM:  {AROM/PROM:27142} ROM Right eval Left eval  Hip flexion    Hip extension    Hip abduction    Hip adduction    Hip internal rotation    Hip external rotation    Knee flexion    Knee extension    Ankle dorsiflexion    Ankle plantarflexion    Ankle inversion    Ankle eversion     (Blank rows = not tested)  LOWER EXTREMITY MMT:  MMT Right eval Left eval  Hip flexion    Hip extension    Hip abduction    Hip adduction    Hip internal rotation    Hip external rotation    Knee flexion    Knee extension    Ankle dorsiflexion    Ankle plantarflexion    Ankle inversion    Ankle eversion     (Blank rows = not tested)  LOWER EXTREMITY SPECIAL TESTS:  {LEspecialtests:26242}  FUNCTIONAL TESTS:  {Functional tests:24029}  GAIT: Distance walked: *** Assistive device utilized: {Assistive devices:23999} Level of assistance: {Levels of assistance:24026} Comments: ***   TREATMENT: [facility] Adult PT Treatment:                                                DATE: *** Therapeutic Exercise: *** Manual Therapy: *** Neuromuscular re-ed: *** Therapeutic Activity: *** Modalities: *** Self Care: ***  PATIENT EDUCATION:  Education details: *** Person educated: {Person educated:25204} Education method: {Education Method:25205} Education comprehension: {Education Comprehension:25206}  HOME  EXERCISE PROGRAM: ***  ASSESSMENT:  CLINICAL IMPRESSION: Patient is a *** y.o. *** who was seen today for physical therapy evaluation and treatment for ***.   OBJECTIVE IMPAIRMENTS: {opptimpairments:25111}.   ACTIVITY LIMITATIONS: {activitylimitations:27494}  PARTICIPATION LIMITATIONS: {participationrestrictions:25113}  PERSONAL FACTORS: {Personal factors:25162} are also affecting patient's functional outcome.   REHAB  POTENTIAL: {rehabpotential:25112}  CLINICAL DECISION MAKING: {clinical decision making:25114}  EVALUATION COMPLEXITY: {Evaluation complexity:25115}   GOALS: Goals reviewed with patient? No  SHORT TERM GOALS: Target date: 08/24/2023   Pt will be compliant and knowledgeable with initial HEP for improved comfort and carryover Baseline: initial HEP given  Goal status: INITIAL  2.  Pt will self report *** pain no greater than ***/10 for improved comfort and functional ability Baseline: ***/10 at worst Goal status: {GOALSTATUS:25110}   LONG TERM GOALS: Target date: ***  *** Baseline:  Goal status: INITIAL  2.  *** Baseline:  Goal status: INITIAL  3.  *** Baseline:  Goal status: INITIAL  4.  *** Baseline:  Goal status: INITIAL  5.  *** Baseline:  Goal status: INITIAL  6.  *** Baseline:  Goal status: INITIAL   PLAN:  PT FREQUENCY: {rehab frequency:25116}  PT DURATION: {rehab duration:25117}  PLANNED INTERVENTIONS: {rehab planned interventions:25118::"97110-Therapeutic exercises","97530- Therapeutic [phone]- Neuromuscular re-education","97535- Self HQIO","96295- Manual therapy"}  PLAN FOR NEXT SESSION: Eloy End, PT 08/03/2023, 8:37 AM

## 2023-10-07 ENCOUNTER — Emergency Department (HOSPITAL_COMMUNITY): Admission: EM | Admit: 2023-10-07 | Discharge: 2023-10-07 | Disposition: A | Discharge status: Home / Self Care

## 2023-10-07 NOTE — ED Notes (Signed)
Pt in peds  °

## 2023-10-09 ENCOUNTER — Telehealth: Payer: Self-pay

## 2023-10-09 NOTE — Telephone Encounter (Signed)
 Patient called in to state she received a notification from the hospital for a visit 10/07/2023, however she was never here at that time. The duration of visit shows 14 minutes and the lone note states "patient in peds"  She agrees to have her information forwarded to the Director of the ED to look into this.  Emailed Irena Manners  the patients information

## 2023-11-05 ENCOUNTER — Telehealth (INDEPENDENT_AMBULATORY_CARE_PROVIDER_SITE_OTHER): Payer: Self-pay | Admitting: Primary Care

## 2023-11-05 NOTE — Telephone Encounter (Signed)
 Called pt to confirm appt. Pt will be present.

## 2023-11-08 ENCOUNTER — Ambulatory Visit (INDEPENDENT_AMBULATORY_CARE_PROVIDER_SITE_OTHER): Admitting: Primary Care

## 2023-11-08 ENCOUNTER — Encounter (INDEPENDENT_AMBULATORY_CARE_PROVIDER_SITE_OTHER): Payer: Self-pay | Admitting: Primary Care

## 2023-11-08 VITALS — BP 109/70 | HR 55 | Wt 199.5 lb

## 2023-11-08 DIAGNOSIS — N926 Irregular menstruation, unspecified: Secondary | ICD-10-CM

## 2023-11-08 DIAGNOSIS — Z3A11 11 weeks gestation of pregnancy: Secondary | ICD-10-CM | POA: Diagnosis not present

## 2023-11-08 LAB — POCT URINE PREGNANCY: Preg Test, Ur: POSITIVE — AB

## 2023-11-08 MED ORDER — PRENATAL VITAMIN 27-0.8 MG PO TABS
27.0000 mg | ORAL_TABLET | Freq: Every day | ORAL | 1 refills | Status: DC
Start: 2023-11-08 — End: 2024-02-14

## 2023-11-08 NOTE — Progress Notes (Signed)
 [facility]  Hannah Burns, is a 33 y.o. female  GMW:[id number]  YQI:[id number]  DOB - [birth year]  Chief Complaint  Patient presents with   Amenorrhea       Subjective:   Hannah Burns is a 33 y.o. female here today for an acute visit.  Patient is here today she is concerned she may be pregnant she has not had a menstrual cycle in 80 days.  Home pregnancy test was positive.  She is in today to confirm.  No problems updated.  Comprehensive ROS Pertinent positive and negative noted in HPI   No Known Allergies  No past medical history on file.  Current Outpatient Medications on File Prior to Visit  Medication Sig Dispense Refill   acetaminophen  (TYLENOL ) 500 MG tablet Take 1-2 tablets (500-1,000 mg total) by mouth every 6 (six) hours as needed. Do not exceed 4,000 mg in any 24 hour period. (Patient taking differently: Take 650 mg by mouth every 6 (six) hours as needed for moderate pain (pain score 4-6) or mild pain (pain score 1-3). 325 mg each)     HYDROcodone -acetaminophen  (NORCO/VICODIN) 5-325 MG tablet Take 1-2 tablets by mouth every 8 (eight) hours as needed for severe pain (pain score 7-10). (Patient not taking: Reported on 11/08/2023) 20 tablet 0   ketorolac  (TORADOL ) 10 MG tablet Take 1 tablet (10 mg total) by mouth every 6 (six) hours as needed. (Patient not taking: Reported on 11/08/2023) 20 tablet 0   Multiple Vitamins-Minerals (MULTIVITAMIN WITH MINERALS) tablet Take 1 tablet by mouth daily. (Patient not taking: Reported on 11/08/2023)     ondansetron  (ZOFRAN -ODT) 4 MG disintegrating tablet Take 1 tablet (4 mg total) by mouth every 8 (eight) hours as needed. (Patient not taking: Reported on 11/08/2023) 20 tablet 0   No current facility-administered medications on file prior to visit.   Health Maintenance  Topic Date Due   Pap with HPV screening  10/19/2018   COVID-19 Vaccine (1 - 2024-25 season) Never done   Flu Shot  01/07/2024   DTaP/Tdap/Td vaccine (3 - Td or  Tdap) 01/23/2033   Hepatitis C Screening  Completed   HIV Screening  Completed   HPV Vaccine  Aged Out   Meningitis B Vaccine  Aged Out    Objective:   Vitals:   11/08/23 1539  BP: 109/70  Pulse: (!) 55  SpO2: 100%  Weight: 199 lb 8.6 oz (90.5 kg)   BP Readings from Last 3 Encounters:  11/08/23 109/70  07/12/23 107/66  02/10/23 115/74      Physical Exam Vitals reviewed.  Constitutional:      Appearance: Normal appearance. She is obese.  HENT:     Head: Normocephalic.     Right Ear: Tympanic membrane, ear canal and external ear normal.     Left Ear: Tympanic membrane, ear canal and external ear normal.     Nose: Nose normal.     Mouth/Throat:     Mouth: Mucous membranes are moist.  Eyes:     Extraocular Movements: Extraocular movements intact.     Pupils: Pupils are equal, round, and reactive to light.  Cardiovascular:     Rate and Rhythm: Normal rate.  Pulmonary:     Effort: Pulmonary effort is normal.     Breath sounds: Normal breath sounds.  Abdominal:     General: Bowel sounds are normal.     Palpations: Abdomen is soft.  Musculoskeletal:        General: Normal range of motion.  Cervical back: Normal range of motion.  Skin:    General: Skin is warm and dry.  Neurological:     Mental Status: She is alert and oriented to person, place, and time.  Psychiatric:        Mood and Affect: Mood normal.        Behavior: Behavior normal.        Thought Content: Thought content normal.       Assessment & Plan  Hannah was seen today for amenorrhea.  Diagnoses and all orders for this visit:  Missed period -     POCT urine pregnancy positive  [redacted] weeks gestation of pregnancy -     Prenatal Vit-Fe Fumarate-FA (PRENATAL VITAMIN) 27-0.8 MG TABS; Take 27 mg by mouth daily. -     Ambulatory referral to Obstetrics / Gynecology    Patient have been counseled extensively about nutrition and exercise. Other issues discussed during this visit include: low  cholesterol diet, weight control and daily exercise, foot care, annual eye examinations at Ophthalmology, importance of adherence with medications and regular follow-up. We also discussed long term complications of uncontrolled diabetes and hypertension.   After baby  The patient was given clear instructions to go to ER or return to medical center if symptoms don't improve, worsen or new problems develop. The patient verbalized understanding. The patient was told to call to get lab results if they haven't heard anything in the next week.   This note has been created with Education officer, environmental. Any transcriptional errors are unintentional.   Marius Siemens, NP 11/08/2023, 4:08 PM

## 2023-12-08 ENCOUNTER — Telehealth

## 2023-12-08 DIAGNOSIS — O09529 Supervision of elderly multigravida, unspecified trimester: Secondary | ICD-10-CM | POA: Insufficient documentation

## 2023-12-08 DIAGNOSIS — O0990 Supervision of high risk pregnancy, unspecified, unspecified trimester: Secondary | ICD-10-CM | POA: Insufficient documentation

## 2023-12-08 DIAGNOSIS — O09522 Supervision of elderly multigravida, second trimester: Secondary | ICD-10-CM

## 2023-12-08 DIAGNOSIS — Z3A15 15 weeks gestation of pregnancy: Secondary | ICD-10-CM | POA: Diagnosis not present

## 2023-12-08 DIAGNOSIS — O0992 Supervision of high risk pregnancy, unspecified, second trimester: Secondary | ICD-10-CM

## 2023-12-08 HISTORY — DX: Supervision of elderly multigravida, unspecified trimester: O09.529

## 2023-12-08 HISTORY — DX: Supervision of high risk pregnancy, unspecified, unspecified trimester: O09.90

## 2023-12-08 MED ORDER — GOJJI WEIGHT SCALE MISC: 1.0000 | 0 refills | Status: DC | PRN

## 2023-12-08 MED ORDER — BLOOD PRESSURE KIT DEVI: 1.0000 | 0 refills | Status: DC | PRN

## 2023-12-08 NOTE — Progress Notes (Signed)
 New OB Intake  I connected with [place] Rozenia Andres  on 12/08/23 at  2:15 PM EDT by MyChart Video Visit and verified that I am speaking with the correct person using two identifiers. Nurse is located at [facility] and pt is located at home.  I discussed the limitations, risks, security and privacy concerns of performing an evaluation and management service by telephone and the availability of in person appointments. I also discussed with the patient that there may be a patient responsible charge related to this service. The patient expressed understanding and agreed to proceed.  I explained I am completing New OB Intake today. We discussed EDD of 05/27/2024, by Last Menstrual Period. Pt is G3P1011. I reviewed her allergies, medications and Medical/Surgical/OB history.    Patient Active Problem List   Diagnosis Date Noted   Supervision of high risk pregnancy, antepartum 12/08/2023     Concerns addressed today  Delivery Plans Plans to deliver at [facility]. Discussed the nature of our practice with multiple providers including residents and students. Due to the size of the practice, the delivering provider may not be the same as those providing prenatal care.   Patient is not interested in water birth.  MyChart/Babyscripts MyChart access verified. I explained pt will have some visits in office and some virtually. Babyscripts instructions given and order placed. Patient verifies receipt of registration text/e-mail. Account successfully created and app downloaded. If patient is a candidate for Optimized scheduling, add to sticky note.   Blood Pressure Cuff/Weight Scale Blood pressure cuff ordered for patient to pick-up from [employer]. Explained after first prenatal appt pt will check weekly and document in Babyscripts. Patient does not have weight scale; order sent to [facility], patient may track weight weekly in Babyscripts.  Anatomy US  Explained first scheduled US  will be around 19  weeks. Anatomy US  scheduled for 01/17/2024 at 800AM.  Is patient a CenteringPregnancy candidate?  Declined Declined due to Schedule   Is patient a Mom+Baby Combined Care candidate?  Not a candidate   If accepted, confirm patient does not intend to move from the area for at least 12 months, then notify Mom+Baby staff  Is patient a candidate for Babyscripts Optimization? Yes, patient declined   First visit review I reviewed new OB appt with patient. Explained pt will be seen by Jerilynn DELENA Buddle MD at first visit. Discussed Jennell genetic screening with patient. Yes Panorama and Horizon.. Routine prenatal labs is not   Last Pap No results found for: DIAGPAP  Vinay Ertl, CMA 12/08/2023  2:17 PM

## 2023-12-13 ENCOUNTER — Other Ambulatory Visit: Payer: Self-pay

## 2023-12-13 ENCOUNTER — Other Ambulatory Visit (HOSPITAL_COMMUNITY)
Admission: RE | Admit: 2023-12-13 | Discharge: 2023-12-13 | Disposition: A | Discharge status: Home / Self Care | Source: Ambulatory Visit | Attending: Obstetrics and Gynecology | Admitting: Obstetrics and Gynecology

## 2023-12-13 ENCOUNTER — Encounter: Payer: Self-pay | Admitting: Obstetrics and Gynecology

## 2023-12-13 ENCOUNTER — Ambulatory Visit: Admitting: Obstetrics and Gynecology

## 2023-12-13 VITALS — BP 108/74 | HR 74 | Wt 197.9 lb

## 2023-12-13 DIAGNOSIS — Z8781 Personal history of (healed) traumatic fracture: Secondary | ICD-10-CM | POA: Diagnosis not present

## 2023-12-13 DIAGNOSIS — O0992 Supervision of high risk pregnancy, unspecified, second trimester: Secondary | ICD-10-CM | POA: Diagnosis not present

## 2023-12-13 DIAGNOSIS — O0990 Supervision of high risk pregnancy, unspecified, unspecified trimester: Secondary | ICD-10-CM | POA: Insufficient documentation

## 2023-12-13 DIAGNOSIS — Z3A16 16 weeks gestation of pregnancy: Secondary | ICD-10-CM | POA: Diagnosis not present

## 2023-12-13 DIAGNOSIS — O09522 Supervision of elderly multigravida, second trimester: Secondary | ICD-10-CM

## 2023-12-13 HISTORY — DX: Personal history of (healed) traumatic fracture: Z87.81

## 2023-12-13 MED ORDER — ASPIRIN 81 MG PO CHEW: 81.0000 mg | CHEWABLE_TABLET | Freq: Every day | ORAL | 3 refills | Status: DC

## 2023-12-13 NOTE — Progress Notes (Signed)
 INITIAL PRENATAL VISIT NOTE  Subjective:  Hannah Burns is a 33 y.o. G3P1011 at [redacted]w[redacted]d by LMP being seen today for her initial prenatal visit.  She has an obstetric history significant for SVD x 1. She has a medical history significant for pelvic fracture about a year ago.  No hardware in place currently  Patient reports no complaints.   . Vag. Bleeding: None.  Movement: Absent. Denies leaking of fluid.    No past medical history on file.  Past Surgical History:  Procedure Laterality Date   HARDWARE REMOVAL Left 07/12/2023   Procedure: HARDWARE REMOVAL;  Surgeon: Celena Sharper, MD;  Location: [facility] OR;  Service: Orthopedics;  Laterality: Left;   PERCUTANEOUS PINNING Left 01/25/2023   Procedure: Sacroiliac Screw Fixation;  Surgeon: Celena Sharper, MD;  Location: [facility] OR;  Service: Orthopedics;  Laterality: Left;    OB History  Gravida Para Term Preterm AB Living  3 1 1  1 1   SAB IAB Ectopic Multiple Live Births  1    1    # Outcome Date GA Lbr Len/2nd Weight Sex Type Anes PTL Lv  3 Current           2 SAB 2023          1 Term 09/14/13 [redacted]w[redacted]d 08:01 / 00:16 6 lb 5.1 oz (2.866 kg) F Vag-Spont EPI  LIV    Social History   Socioeconomic History   Marital status: Single    Spouse name: Not on file   Number of children: 1   Years of education: 12th grade   Highest education level: High school graduate  Occupational History   Not on file  Tobacco Use   Smoking status: Some Days    Current packs/day: 1.00    Average packs/day: 1 pack/day for 9.5 years (9.5 ttl pk-yrs)    Types: Cigarettes    Start date: 2016    Last attempt to quit: 07/19/2013   Smokeless tobacco: Never  Vaping Use   Vaping status: Never Used  Substance and Sexual Activity   Alcohol  use: Not Currently    Comment: occassionlly   Drug use: Not Currently    Frequency: 1.0 times per week    Types: Marijuana    Comment: LAST TIME SMOKED - JUNE   Sexual activity: Yes    Birth control/protection: None,  I.U.D.  Other Topics Concern   Not on file  Social History Narrative   Not on file   Social Drivers of Health   Financial Resource Strain: Not on file  Food Insecurity: No Food Insecurity (01/28/2023)   Hunger Vital Sign    Worried About Running Out of Food in the Last Year: Never true    Ran Out of Food in the Last Year: Never true  Transportation Needs: No Transportation Needs (01/28/2023)   PRAPARE - Administrator, Civil Service (Medical): No    Lack of Transportation (Non-Medical): No  Physical Activity: Sufficiently Active (10/24/2018)   Exercise Vital Sign    Days of Exercise per Week: 5 days    Minutes of Exercise per Session: 30 min  Stress: Stress Concern Present (10/24/2018)   [employer] of Occupational Health - Occupational Stress Questionnaire    Feeling of Stress : To some extent  Social Connections: Somewhat Isolated (10/24/2018)   Social Connection and Isolation Panel    Frequency of Communication with Friends and Family: More than three times a week    Frequency of Social Gatherings with  Friends and Family: More than three times a week    Attends Religious Services: More than 4 times per year    Active Member of Clubs or Organizations: No    Attends Banker Meetings: Never    Marital Status: Never married    Family History  Problem Relation Age of Onset   Diabetes Maternal Grandmother      Current Outpatient Medications:    acetaminophen  (TYLENOL ) 500 MG tablet, Take 1-2 tablets (500-1,000 mg total) by mouth every 6 (six) hours as needed. Do not exceed 4,000 mg in any 24 hour period., Disp: , Rfl:    aspirin  81 MG chewable tablet, Chew 1 tablet (81 mg total) by mouth daily., Disp: 90 tablet, Rfl: 3   Prenatal Vit-Fe Fumarate-FA (PRENATAL VITAMIN) 27-0.8 MG TABS, Take 27 mg by mouth daily., Disp: 90 tablet, Rfl: 1   Blood Pressure Monitoring (BLOOD PRESSURE KIT) DEVI, 1 Device by Does not apply route as needed. (Patient not taking:  Reported on 12/13/2023), Disp: 1 each, Rfl: 0   HYDROcodone -acetaminophen  (NORCO/VICODIN) 5-325 MG tablet, Take 1-2 tablets by mouth every 8 (eight) hours as needed for severe pain (pain score 7-10). (Patient not taking: Reported on 12/08/2023), Disp: 20 tablet, Rfl: 0   Misc. Devices (GOJJI WEIGHT SCALE) MISC, 1 Device by Does not apply route as needed. (Patient not taking: Reported on 12/13/2023), Disp: 1 each, Rfl: 0  No Known Allergies  Review of Systems: Negative except for what is mentioned in HPI.  Objective:   Vitals:   12/13/23 1428  BP: 108/74  Pulse: 74  Weight: 197 lb 14.4 oz (89.8 kg)    Fetal Status: Fetal Heart Rate (bpm): 161   Movement: Absent     Physical Exam: BP 108/74   Pulse 74   Wt 197 lb 14.4 oz (89.8 kg)   LMP 08/21/2023   BMI 36.20 kg/m  CONSTITUTIONAL: Well-developed, well-nourished female in no acute distress.  NEUROLOGIC: Alert and oriented to person, place, and time. Normal reflexes, muscle tone coordination. No cranial nerve deficit noted. PSYCHIATRIC: Normal mood and affect. Normal behavior. Normal judgment and thought content. SKIN: Skin is warm and dry. No rash noted. Not diaphoretic. No erythema. No pallor. HENT:  Normocephalic, atraumatic, External right and left ear normal. Oropharynx is clear and moist EYES: Conjunctivae and EOM are normal.  NECK: Normal range of motion, supple, no masses CARDIOVASCULAR: Normal heart rate noted, regular rhythm RESPIRATORY: Effort and breath sounds normal, no problems with respiration noted BREASTS: deferred ABDOMEN: Soft, nontender, nondistended, gravid. HL:izqzmmzi MUSCULOSKELETAL: Normal range of motion. EXT:  No edema and no tenderness. 2+ distal pulses.   Assessment and Plan:  Pregnancy: G3P1011 at [redacted]w[redacted]d by LMP  1. Supervision of high risk pregnancy, antepartum (Primary) Continue routine prenatal care Start baby ASA now due to risk factors - Culture, OB Urine - GC/Chlamydia probe amp ([facility])not at [facility] - CBC/D/Plt+RPR+Rh+ABO+RubIgG... - Hemoglobin A1c - PANORAMA PRENATAL TEST - HORIZON Basic Panel - AFP, Serum, Open Spina Bifida - aspirin  81 MG chewable tablet; Chew 1 tablet (81 mg total) by mouth daily.  Dispense: 90 tablet; Refill: 3  2. [redacted] weeks gestation of pregnancy   3. Multigravida of advanced maternal age in second trimester   4. History of pelvic fracture Per pt, no recommendations against vaginal delviery.  Accident was almost a year ago and hardware has been removed.   Preterm labor symptoms and general obstetric precautions including but not limited to vaginal bleeding, contractions, leaking  of fluid and fetal movement were reviewed in detail with the patient.  Please refer to After Visit Summary for other counseling recommendations.   Return in about 4 weeks (around 01/10/2024) for [facility], in person.  Jerilynn DELENA Buddle 12/13/2023 2:55 PM

## 2023-12-15 ENCOUNTER — Ambulatory Visit: Payer: Self-pay | Admitting: Obstetrics and Gynecology

## 2023-12-15 DIAGNOSIS — O0990 Supervision of high risk pregnancy, unspecified, unspecified trimester: Secondary | ICD-10-CM

## 2023-12-15 LAB — GC/CHLAMYDIA PROBE AMP (~~LOC~~) NOT AT ARMC
Chlamydia: NEGATIVE
Comment: NEGATIVE
Comment: NORMAL
Neisseria Gonorrhea: NEGATIVE

## 2023-12-15 LAB — AFP, SERUM, OPEN SPINA BIFIDA
AFP MoM: 0.7
AFP Value: 21.3 ng/mL
Gest. Age on Collection Date: 16 wk
Maternal Age At EDD: 35.6 a
OSBR Risk 1 IN: 10000
Test Results:: NEGATIVE
Weight: 198 [lb_av]

## 2023-12-15 LAB — CBC/D/PLT+RPR+RH+ABO+RUBIGG...
Antibody Screen: NEGATIVE
Basophils Absolute: 0.1 x10E3/uL (ref 0.0–0.2)
Basos: 1 %
EOS (ABSOLUTE): 0.5 x10E3/uL — ABNORMAL HIGH (ref 0.0–0.4)
Eos: 6 %
HCV Ab: NONREACTIVE
HIV Screen 4th Generation wRfx: NONREACTIVE
Hematocrit: 40 % (ref 34.0–46.6)
Hemoglobin: 13.4 g/dL (ref 11.1–15.9)
Hepatitis B Surface Ag: NEGATIVE
Immature Grans (Abs): 0 x10E3/uL (ref 0.0–0.1)
Immature Granulocytes: 0 %
Lymphocytes Absolute: 2.2 x10E3/uL (ref 0.7–3.1)
Lymphs: 27 %
MCH: 32.4 pg (ref 26.6–33.0)
MCHC: 33.5 g/dL (ref 31.5–35.7)
MCV: 97 fL (ref 79–97)
Monocytes Absolute: 0.5 x10E3/uL (ref 0.1–0.9)
Monocytes: 6 %
Neutrophils Absolute: 4.8 x10E3/uL (ref 1.4–7.0)
Neutrophils: 60 %
Platelets: 202 x10E3/uL (ref 150–450)
RBC: 4.13 x10E6/uL (ref 3.77–5.28)
RDW: 12.2 % (ref 11.7–15.4)
RPR Ser Ql: NONREACTIVE
Rh Factor: POSITIVE
Rubella Antibodies, IGG: 13.7 {index} (ref >0.99)
WBC: 8.1 x10E3/uL (ref 3.4–10.8)

## 2023-12-15 LAB — HEMOGLOBIN A1C
Est. average glucose Bld gHb Est-mCnc: 97 mg/dL
Hgb A1c MFr Bld: 5 % (ref 4.8–5.6)

## 2023-12-15 LAB — HCV INTERPRETATION

## 2023-12-15 LAB — CULTURE, OB URINE

## 2023-12-15 LAB — URINE CULTURE, OB REFLEX

## 2023-12-20 LAB — PANORAMA PRENATAL TEST FULL PANEL:PANORAMA TEST PLUS 5 ADDITIONAL MICRODELETIONS: FETAL FRACTION: 4.5

## 2023-12-20 LAB — HORIZON CUSTOM: REPORT SUMMARY: NEGATIVE

## 2024-01-10 ENCOUNTER — Ambulatory Visit: Admitting: Family Medicine

## 2024-01-10 ENCOUNTER — Other Ambulatory Visit: Payer: Self-pay

## 2024-01-10 VITALS — BP 103/68 | HR 73 | Wt 203.0 lb

## 2024-01-10 DIAGNOSIS — O0990 Supervision of high risk pregnancy, unspecified, unspecified trimester: Secondary | ICD-10-CM

## 2024-01-10 DIAGNOSIS — O0992 Supervision of high risk pregnancy, unspecified, second trimester: Secondary | ICD-10-CM

## 2024-01-10 DIAGNOSIS — O09522 Supervision of elderly multigravida, second trimester: Secondary | ICD-10-CM | POA: Diagnosis not present

## 2024-01-10 DIAGNOSIS — Z3A20 20 weeks gestation of pregnancy: Secondary | ICD-10-CM

## 2024-01-10 DIAGNOSIS — Z8781 Personal history of (healed) traumatic fracture: Secondary | ICD-10-CM

## 2024-01-10 MED ORDER — ASPIRIN 81 MG PO CHEW: 81.0000 mg | CHEWABLE_TABLET | Freq: Every day | ORAL | 3 refills | Status: DC

## 2024-01-10 NOTE — Progress Notes (Signed)
   PRENATAL VISIT NOTE  Subjective:  Hannah Burns is a 33 y.o. G3P1011 at [redacted]w[redacted]d being seen today for ongoing prenatal care.  She is currently monitored for the following issues for this high-risk pregnancy and has Supervision of high risk pregnancy, antepartum; AMA (advanced maternal age) multigravida 35+; and History of pelvic fracture on their problem list.  Patient reports no contractions, no cramping, and no leaking.  Contractions: Not present. Vag. Bleeding: None.  Movement: Present. Denies leaking of fluid.   The following portions of the patient's history were reviewed and updated as appropriate: allergies, current medications, past family history, past medical history, past social history, past surgical history and problem list.   Objective:    Vitals:   01/10/24 1314  BP: 103/68  Pulse: 73  Weight: 203 lb (92.1 kg)    Fetal Status:  Fetal Heart Rate (bpm): 159   Movement: Present    General: Alert, oriented and cooperative. Patient is in no acute distress.  Skin: Skin is warm and dry. No rash noted.   Cardiovascular: Normal heart rate noted  Respiratory: Normal respiratory effort, no problems with respiration noted  Abdomen: Soft, gravid, appropriate for gestational age.  Pain/Pressure: Absent     Pelvic: Cervical exam deferred        Extremities: Normal range of motion.  Edema: None  Mental Status: Normal mood and affect. Normal behavior. Normal judgment and thought content.   Assessment and Plan:  Pregnancy: G3P1011 at [redacted]w[redacted]d 1. Supervision of high risk pregnancy, antepartum (Primary) Continue ASA 81mg  daily. Refill sent to patient pharmacy. Normal prenatal labs NIPs LR female Horizon negative   - aspirin  81 MG chewable tablet; Chew 1 tablet (81 mg total) by mouth daily.  Dispense: 90 tablet; Refill: 3  2. Multigravida of advanced maternal age in second trimester   3. History of pelvic fracture Percutaneous sacro0iliac screw fixationtion of the posterior  pelvice ring on 01/25/2023 with removal of hardware 07/12/2023. No pelvic instability noted at that time. Both OP notes are in the chart.   4. [redacted] weeks gestation of pregnancy Anatomy scan scheduled for 01/17/24.  Preterm labor symptoms and general obstetric precautions including but not limited to vaginal bleeding, contractions, leaking of fluid and fetal movement were reviewed in detail with the patient. Please refer to After Visit Summary for other counseling recommendations.   No follow-ups on file.  Future Appointments  Date Time Provider Department Center  01/17/2024  8:00 AM [facility] PROVIDER 1 [facility]  01/17/2024  8:30 AM [facility] US5 [facility]    Darlin Stenseth LITTIE Angles, MD

## 2024-01-17 ENCOUNTER — Ambulatory Visit: Attending: Obstetrics and Gynecology | Admitting: Obstetrics and Gynecology

## 2024-01-17 ENCOUNTER — Ambulatory Visit (HOSPITAL_BASED_OUTPATIENT_CLINIC_OR_DEPARTMENT_OTHER)

## 2024-01-17 ENCOUNTER — Other Ambulatory Visit: Payer: Self-pay | Admitting: *Deleted

## 2024-01-17 VITALS — BP 120/50 | HR 72

## 2024-01-17 DIAGNOSIS — E669 Obesity, unspecified: Secondary | ICD-10-CM

## 2024-01-17 DIAGNOSIS — O0990 Supervision of high risk pregnancy, unspecified, unspecified trimester: Secondary | ICD-10-CM

## 2024-01-17 DIAGNOSIS — Z3A17 17 weeks gestation of pregnancy: Secondary | ICD-10-CM

## 2024-01-17 DIAGNOSIS — O99212 Obesity complicating pregnancy, second trimester: Secondary | ICD-10-CM | POA: Diagnosis present

## 2024-01-17 DIAGNOSIS — O35BXX0 Maternal care for other (suspected) fetal abnormality and damage, fetal cardiac anomalies, not applicable or unspecified: Secondary | ICD-10-CM | POA: Diagnosis not present

## 2024-01-17 DIAGNOSIS — O358XX0 Maternal care for other (suspected) fetal abnormality and damage, not applicable or unspecified: Secondary | ICD-10-CM | POA: Diagnosis not present

## 2024-01-17 DIAGNOSIS — O09522 Supervision of elderly multigravida, second trimester: Secondary | ICD-10-CM

## 2024-01-17 DIAGNOSIS — O35EXX0 Maternal care for other (suspected) fetal abnormality and damage, fetal genitourinary anomalies, not applicable or unspecified: Secondary | ICD-10-CM | POA: Diagnosis not present

## 2024-01-17 DIAGNOSIS — O283 Abnormal ultrasonic finding on antenatal screening of mother: Secondary | ICD-10-CM

## 2024-01-17 DIAGNOSIS — Z3A21 21 weeks gestation of pregnancy: Secondary | ICD-10-CM | POA: Diagnosis not present

## 2024-01-17 DIAGNOSIS — Z362 Encounter for other antenatal screening follow-up: Secondary | ICD-10-CM

## 2024-01-17 NOTE — Progress Notes (Signed)
 Maternal-Fetal Medicine Consultation Name: [place] Wojnar MRN: [id number]  G3 P1011 at unknown gestational age is here for fetal anatomy scan. Advanced maternal age (AMA).  On cell-free fetal DNA screening, the risks of fetal aneuploidies are not increased. Patient reports no chronic medical conditions.  Ultrasound We performed fetal anatomy scan.  An echogenic intracardiac focus is seen.  No other makers of aneuploidies or fetal structural defects are seen. Fetal biometry is consistent with her previously-established dates. Amniotic fluid is normal and good fetal activity is seen. Patient understands the limitations of ultrasound in detecting fetal anomalies.  As maternal obesity imposes limitations on the resolution of images, fetal anomalies may be missed.  Our concerns include: Echogenic intracardiac focus (EIF) EIF is present in about 3% to 4% of normal fetuses and in some fetuses with Down syndrome.  Given that she has low risk for fetal Down syndrome on cell-free fetal DNA screening, this should be considered a normal variant.  I did not recommend amniocentesis for this finding.  I counseled the patient that cell free fetal DNA screening has a greater detection rate for Down syndrome.  However, only amniocentesis will give a definitive result on the fetal karyotype.  I explained amniocentesis procedure and possible complication of miscarriage (1 and 500 procedures). Patient opted not to have amniocentesis.  Pregnancy dating Patient is unsure of her LMP date.  I recommended that we assign her EDD based on today's ultrasound measurements.  Second trimester ultrasound dating is less accurate than the first trimester dating. We have assigned her EDD at 06/26/2024.  Recommendations: - An appointment was made for her to return in 4 weeks for completion of fetal anatomy. - Fetal growth assessment at [redacted] weeks gestation [facility]).  Consultation including face-to-face (more than 50%) counseling 30  minutes.

## 2024-02-14 ENCOUNTER — Ambulatory Visit: Attending: Obstetrics and Gynecology | Admitting: Maternal & Fetal Medicine

## 2024-02-14 ENCOUNTER — Ambulatory Visit (HOSPITAL_BASED_OUTPATIENT_CLINIC_OR_DEPARTMENT_OTHER)

## 2024-02-14 ENCOUNTER — Other Ambulatory Visit: Payer: Self-pay | Admitting: *Deleted

## 2024-02-14 ENCOUNTER — Other Ambulatory Visit (INDEPENDENT_AMBULATORY_CARE_PROVIDER_SITE_OTHER): Payer: Self-pay | Admitting: Primary Care

## 2024-02-14 DIAGNOSIS — Z3A11 11 weeks gestation of pregnancy: Secondary | ICD-10-CM

## 2024-02-14 DIAGNOSIS — O09522 Supervision of elderly multigravida, second trimester: Secondary | ICD-10-CM | POA: Insufficient documentation

## 2024-02-14 DIAGNOSIS — Z362 Encounter for other antenatal screening follow-up: Secondary | ICD-10-CM | POA: Diagnosis present

## 2024-02-14 DIAGNOSIS — O365990 Maternal care for other known or suspected poor fetal growth, unspecified trimester, not applicable or unspecified: Secondary | ICD-10-CM

## 2024-02-14 DIAGNOSIS — Z3A21 21 weeks gestation of pregnancy: Secondary | ICD-10-CM | POA: Diagnosis not present

## 2024-02-14 DIAGNOSIS — O99212 Obesity complicating pregnancy, second trimester: Secondary | ICD-10-CM | POA: Insufficient documentation

## 2024-02-14 DIAGNOSIS — E669 Obesity, unspecified: Secondary | ICD-10-CM | POA: Diagnosis not present

## 2024-02-14 DIAGNOSIS — O358XX0 Maternal care for other (suspected) fetal abnormality and damage, not applicable or unspecified: Secondary | ICD-10-CM | POA: Insufficient documentation

## 2024-02-14 DIAGNOSIS — O35BXX0 Maternal care for other (suspected) fetal abnormality and damage, fetal cardiac anomalies, not applicable or unspecified: Secondary | ICD-10-CM

## 2024-02-14 DIAGNOSIS — Z3482 Encounter for supervision of other normal pregnancy, second trimester: Secondary | ICD-10-CM

## 2024-02-14 NOTE — Progress Notes (Signed)
 After review, MFM consult with provider is not indicated for today  William Glenn, DO 02/14/2024 4:11 PM  [facility]

## 2024-02-19 ENCOUNTER — Ambulatory Visit: Admission: EM | Admit: 2024-02-19 | Discharge: 2024-02-19 | Disposition: A | Discharge status: Home / Self Care

## 2024-02-19 DIAGNOSIS — L02419 Cutaneous abscess of limb, unspecified: Secondary | ICD-10-CM

## 2024-02-19 MED ORDER — CLINDAMYCIN HCL 150 MG PO CAPS: 450.0000 mg | ORAL_CAPSULE | Freq: Three times a day (TID) | ORAL | 0 refills | Status: AC

## 2024-02-19 NOTE — ED Provider Notes (Signed)
 [facility]  Note:  This document was prepared using Conservation officer, historic buildings and may include unintentional dictation errors.  MRN: [id number] DOB: 02-21-1991  Subjective:   Hannah Burns is a 33 y.o. female presenting for right axillary cyst for quite a while.  Patient reports over the last few days its become more painful and began draining purulent discharge.  Patient denies any fever, surrounding erythema, warmth, bleeding.  Patient reports that she has not taken any over-the-counter medication to treat symptoms.  Has been applying warm compresses to the area which has aided in draining of pus.  Patient denies any other secondary medical concerns.  Patient states that she is currently pregnant and wants to make sure that what ever medication she is prescribed is safe for her baby.  No current facility-administered medications for this encounter.  Current Outpatient Medications:    clindamycin  (CLEOCIN ) 150 MG capsule, Take 3 capsules (450 mg total) by mouth 3 (three) times daily for 7 days., Disp: 63 capsule, Rfl: 0   Prenatal Vit-Fe Fumarate-FA (MULTIVITAMIN-PRENATAL) 27-0.8 MG TABS tablet, TAKE 1 TABLET BY MOUTH EVERY DAY, Disp: 100 tablet, Rfl: 1   acetaminophen  (TYLENOL ) 500 MG tablet, Take 1-2 tablets (500-1,000 mg total) by mouth every 6 (six) hours as needed. Do not exceed 4,000 mg in any 24 hour period., Disp: , Rfl:    aspirin  81 MG chewable tablet, Chew 1 tablet (81 mg total) by mouth daily., Disp: 90 tablet, Rfl: 3   Blood Pressure Monitoring (BLOOD PRESSURE KIT) DEVI, 1 Device by Does not apply route as needed. (Patient not taking: No sig reported), Disp: 1 each, Rfl: 0   HYDROcodone -acetaminophen  (NORCO/VICODIN) 5-325 MG tablet, Take 1-2 tablets by mouth every 8 (eight) hours as needed for severe pain (pain score 7-10). (Patient not taking: Reported on 11/08/2023), Disp: 20 tablet, Rfl: 0   Misc. Devices (GOJJI WEIGHT SCALE) MISC, 1 Device by Does not apply  route as needed. (Patient not taking: No sig reported), Disp: 1 each, Rfl: 0   No Known Allergies  History reviewed. No pertinent past medical history.   Past Surgical History:  Procedure Laterality Date   HARDWARE REMOVAL Left 07/12/2023   Procedure: HARDWARE REMOVAL;  Surgeon: Celena Sharper, MD;  Location: [facility] OR;  Service: Orthopedics;  Laterality: Left;   PERCUTANEOUS PINNING Left 01/25/2023   Procedure: Sacroiliac Screw Fixation;  Surgeon: Celena Sharper, MD;  Location: [facility] OR;  Service: Orthopedics;  Laterality: Left;    Family History  Problem Relation Age of Onset   Diabetes Maternal Grandmother     Social History   Tobacco Use   Smoking status: Some Days    Current packs/day: 1.00    Average packs/day: 1 pack/day for 9.7 years (9.7 ttl pk-yrs)    Types: Cigarettes    Start date: 2016    Last attempt to quit: 07/19/2013   Smokeless tobacco: Never  Vaping Use   Vaping status: Never Used  Substance Use Topics   Alcohol  use: Not Currently    Comment: occassionlly   Drug use: Not Currently    Frequency: 1.0 times per week    Types: Marijuana    Comment: LAST TIME SMOKED - JUNE    ROS Refer to HPI for ROS details.  Objective:   Vitals: BP 108/69 (BP Location: Right Arm)   Pulse 97   Temp 98.1 F (36.7 C) (Oral)   Resp 18   Ht 5' 4 (1.626 m)   Wt 210 lb (95.3 kg)  LMP 08/21/2023   SpO2 97%   BMI 36.05 kg/m   Physical Exam Vitals and nursing note reviewed.  Constitutional:      General: She is not in acute distress.    Appearance: Normal appearance. She is not ill-appearing.  HENT:     Head: Normocephalic.  Cardiovascular:     Rate and Rhythm: Normal rate.  Pulmonary:     Effort: Pulmonary effort is normal. No respiratory distress.  Skin:    General: Skin is warm and dry.     Capillary Refill: Capillary refill takes less than 2 seconds.     Findings: Abscess, erythema and wound present.  Neurological:     General: No focal deficit present.      Mental Status: She is alert and oriented to person, place, and time.  Psychiatric:        Mood and Affect: Mood normal.        Behavior: Behavior normal.     Procedures  No results found for this or any previous visit (from the past 24 hours).  No results found.   Assessment and Plan :     Discharge Instructions       1. Axillary abscess (Primary) - clindamycin  (CLEOCIN ) 150 MG capsule; Take 3 capsules (450 mg total) by mouth 3 (three) times daily for 7 days.  Dispense: 63 capsule; Refill: 0 - Continue to allow abscess to drain cover with bandage to prevent soiling close. - Continue applying warm compress to the area to increase blood flow and aid in resolution of axillary abscess. - Take Tylenol  1000 mg 4 times daily as needed for pain secondary to axillary abscess. -Continue to monitor symptoms for any change in severity if there is any escalation of current symptoms or development of new symptoms follow-up in ER for further evaluation and management.       Latara Micheli B Naveen Lorusso   Abbe Bula, [place] B, [place] 02/19/24 [phone]

## 2024-02-19 NOTE — ED Triage Notes (Signed)
 Patient reports knot/boil/cyst under right arm (pit) for a while now, becoming more painful. No fever.

## 2024-02-19 NOTE — Discharge Instructions (Addendum)
  1. Axillary abscess (Primary) - clindamycin  (CLEOCIN ) 150 MG capsule; Take 3 capsules (450 mg total) by mouth 3 (three) times daily for 7 days.  Dispense: 63 capsule; Refill: 0 - Continue to allow abscess to drain cover with bandage to prevent soiling close. - Continue applying warm compress to the area to increase blood flow and aid in resolution of axillary abscess. - Take Tylenol  1000 mg 4 times daily as needed for pain secondary to axillary abscess. -Continue to monitor symptoms for any change in severity if there is any escalation of current symptoms or development of new symptoms follow-up in ER for further evaluation and management.

## 2024-02-22 ENCOUNTER — Other Ambulatory Visit: Payer: Self-pay

## 2024-02-22 ENCOUNTER — Ambulatory Visit (INDEPENDENT_AMBULATORY_CARE_PROVIDER_SITE_OTHER)

## 2024-02-22 VITALS — BP 133/80 | HR 86 | Wt 200.0 lb

## 2024-02-22 DIAGNOSIS — O09522 Supervision of elderly multigravida, second trimester: Secondary | ICD-10-CM | POA: Diagnosis not present

## 2024-02-22 DIAGNOSIS — Z3A22 22 weeks gestation of pregnancy: Secondary | ICD-10-CM | POA: Diagnosis not present

## 2024-02-22 DIAGNOSIS — O0992 Supervision of high risk pregnancy, unspecified, second trimester: Secondary | ICD-10-CM

## 2024-02-22 DIAGNOSIS — O0990 Supervision of high risk pregnancy, unspecified, unspecified trimester: Secondary | ICD-10-CM

## 2024-02-22 NOTE — Assessment & Plan Note (Signed)
 Stopped taking ASA81. Discussed its role in preventing/delaying preE and placental implications of preE. She will trial again, discussed Tylenol  and Excedrin migraines as safe medications for pain relief in preg.

## 2024-02-22 NOTE — Patient Instructions (Addendum)
 Please go to the MAU (Maternity Assessment Unit) if you experience vaginal bleeding,leaking/gush of fluid like your water broke, notice decreased movement from your baby after doing kick counts, or contractions the are becoming more intense or more frequent.

## 2024-02-22 NOTE — Assessment & Plan Note (Signed)
 Elevated bp today, pt reports walking from bus stop. BP otherwise appropriate to the date, CTM.

## 2024-02-22 NOTE — Progress Notes (Signed)
   PRENATAL VISIT NOTE  Subjective:  Hannah Burns is a 33 y.o. G3P1011 at [redacted]w[redacted]d being seen today for ongoing prenatal care. She is currently monitored for the following issues for this high-risk pregnancy  Patient Active Problem List   Diagnosis Date Noted   History of pelvic fracture 12/13/2023   Supervision of high risk pregnancy, antepartum 12/08/2023   AMA (advanced maternal age) multigravida 35+ 12/08/2023   Elevated bp today, walked from bus stop before appt.  Not taking ASA81, had headaches  Patient reports no complaints.   Contractions: Not present Vag. Bleeding: None Movement: Present Denies leaking of fluid.   The following portions of the patient's history were reviewed and updated as appropriate: allergies, current medications, past family history, past medical history, past social history, past surgical history and problem list.   Objective:   Vitals:   02/22/24 1619  BP: 133/80  Pulse: 86  Weight: 200 lb (90.7 kg)    Fetal Status: Fetal Heart Rate (bpm): 154   Movement: Present     General:  Alert, oriented and cooperative. Patient is in no acute distress.  Skin: Skin is warm and dry. No rash noted.   Cardiovascular: Normal heart rate noted  Respiratory: Normal respiratory effort, no problems with respiration noted  Abdomen: Soft, gravid, appropriate for gestational age.  Pain/Pressure: Absent     Pelvic: Cervical exam deferred        Extremities: Normal range of motion.  Edema: None  Mental Status: Normal mood and affect. Normal behavior. Normal judgment and thought content.   Assessment and Plan:  Hannah Burns is 33 y.o. G3P1011 at [redacted]w[redacted]d, 06/26/2024, by Ultrasound  Assessment & Plan Supervision of high risk pregnancy, antepartum Elevated bp today, pt reports walking from bus stop. BP otherwise appropriate to the date, CTM. [redacted] weeks gestation of pregnancy  Multigravida of advanced maternal age in second trimester Stopped taking ASA81.  Discussed its role in preventing/delaying preE and placental implications of preE. She will trial again, discussed Tylenol  and Excedrin migraines as safe medications for pain relief in preg.   Preterm labor symptoms and general obstetric precautions including but not limited to vaginal bleeding, contractions, leaking of fluid and fetal movement were reviewed in detail with the patient. Please refer to After Visit Summary for other counseling recommendations.   Future Appointments  Date Time Provider Department Center  03/14/2024  3:30 PM [facility] PROVIDER 1 [facility]  03/14/2024  3:45 PM [facility] US2 [facility]    Barabara Maier, DO FMOB Fellow, Faculty Practice [facility], Center for [facility]

## 2024-03-08 DIAGNOSIS — O24419 Gestational diabetes mellitus in pregnancy, unspecified control: Secondary | ICD-10-CM

## 2024-03-08 HISTORY — DX: Gestational diabetes mellitus in pregnancy, unspecified control: O24.419

## 2024-03-14 ENCOUNTER — Ambulatory Visit (HOSPITAL_BASED_OUTPATIENT_CLINIC_OR_DEPARTMENT_OTHER): Admitting: Obstetrics

## 2024-03-14 ENCOUNTER — Ambulatory Visit: Attending: Obstetrics and Gynecology

## 2024-03-14 VITALS — BP 116/58

## 2024-03-14 DIAGNOSIS — Z3A25 25 weeks gestation of pregnancy: Secondary | ICD-10-CM | POA: Insufficient documentation

## 2024-03-14 DIAGNOSIS — E669 Obesity, unspecified: Secondary | ICD-10-CM | POA: Diagnosis not present

## 2024-03-14 DIAGNOSIS — O09522 Supervision of elderly multigravida, second trimester: Secondary | ICD-10-CM

## 2024-03-14 DIAGNOSIS — O365990 Maternal care for other known or suspected poor fetal growth, unspecified trimester, not applicable or unspecified: Secondary | ICD-10-CM | POA: Diagnosis present

## 2024-03-14 DIAGNOSIS — O0990 Supervision of high risk pregnancy, unspecified, unspecified trimester: Secondary | ICD-10-CM | POA: Insufficient documentation

## 2024-03-14 DIAGNOSIS — O99212 Obesity complicating pregnancy, second trimester: Secondary | ICD-10-CM

## 2024-03-14 DIAGNOSIS — O35BXX0 Maternal care for other (suspected) fetal abnormality and damage, fetal cardiac anomalies, not applicable or unspecified: Secondary | ICD-10-CM | POA: Diagnosis not present

## 2024-03-15 ENCOUNTER — Other Ambulatory Visit: Payer: Self-pay | Admitting: *Deleted

## 2024-03-15 DIAGNOSIS — O09522 Supervision of elderly multigravida, second trimester: Secondary | ICD-10-CM

## 2024-03-15 DIAGNOSIS — O358XX0 Maternal care for other (suspected) fetal abnormality and damage, not applicable or unspecified: Secondary | ICD-10-CM

## 2024-03-15 NOTE — Progress Notes (Signed)
 MFM Note   Hannah Burns is currently at 25 weeks and 1 day.  She has been followed due to advanced maternal age, maternal obesity and the low normal EFW noted on her last exam.  She denies any problems since her last exam.  Sonographic findings Single intrauterine pregnancy at 25w 1d.  Fetal cardiac activity:  Observed and appears normal. Presentation: Cephalic. Interval fetal anatomy appears normal. Fetal biometry shows the estimated fetal weight of 1 pound 9 ounces which measuresat the 16th percentile. Amniotic fluid volume: Subjectively upper-normal. MVP: 6.96 cm. Placenta: Anterior.  There are limitations of prenatal ultrasound such as the inability to detect certain abnormalities due to poor visualization. Various factors such as fetal position, gestational age and maternal body habitus may increase the difficulty in visualizing the fetal anatomy.    Due to advanced maternal age and the low normal EFW noted today, a follow-up growth scan was scheduled in 7 weeks.

## 2024-03-20 ENCOUNTER — Other Ambulatory Visit: Payer: Self-pay

## 2024-03-20 DIAGNOSIS — O0990 Supervision of high risk pregnancy, unspecified, unspecified trimester: Secondary | ICD-10-CM

## 2024-03-21 ENCOUNTER — Encounter: Payer: Self-pay | Admitting: Obstetrics and Gynecology

## 2024-03-21 ENCOUNTER — Ambulatory Visit (INDEPENDENT_AMBULATORY_CARE_PROVIDER_SITE_OTHER): Admitting: Obstetrics and Gynecology

## 2024-03-21 ENCOUNTER — Other Ambulatory Visit

## 2024-03-21 VITALS — BP 128/75 | HR 78 | Wt 205.2 lb

## 2024-03-21 DIAGNOSIS — Z3A11 11 weeks gestation of pregnancy: Secondary | ICD-10-CM

## 2024-03-21 DIAGNOSIS — Z8781 Personal history of (healed) traumatic fracture: Secondary | ICD-10-CM | POA: Diagnosis not present

## 2024-03-21 DIAGNOSIS — O09522 Supervision of elderly multigravida, second trimester: Secondary | ICD-10-CM | POA: Diagnosis not present

## 2024-03-21 DIAGNOSIS — Z3A26 26 weeks gestation of pregnancy: Secondary | ICD-10-CM

## 2024-03-21 DIAGNOSIS — O0992 Supervision of high risk pregnancy, unspecified, second trimester: Secondary | ICD-10-CM

## 2024-03-21 DIAGNOSIS — O0990 Supervision of high risk pregnancy, unspecified, unspecified trimester: Secondary | ICD-10-CM

## 2024-03-21 MED ORDER — PRENATAL 27-0.8 MG PO TABS: 1.0000 | ORAL_TABLET | Freq: Every day | ORAL | 1 refills | Status: AC

## 2024-03-21 NOTE — Progress Notes (Signed)
   PRENATAL VISIT NOTE  Subjective:  Hannah Burns is a 33 y.o. G3P1011 at [redacted]w[redacted]d being seen today for ongoing prenatal care.  She is currently monitored for the following issues for this high-risk pregnancy and has Supervision of high risk pregnancy, antepartum; AMA (advanced maternal age) multigravida 35+; and History of pelvic fracture on their problem list.  Patient doing well with no acute concerns today. She reports no complaints.  Contractions: Not present. Vag. Bleeding: None.  Movement: Present. Denies leaking of fluid.   The following portions of the patient's history were reviewed and updated as appropriate: allergies, current medications, past family history, past medical history, past social history, past surgical history and problem list. Problem list updated.  Objective:   Vitals:   03/21/24 0957  BP: 128/75  Pulse: 78  Weight: 205 lb 3.2 oz (93.1 kg)    Fetal Status: Fetal Heart Rate (bpm): 147 Fundal Height: 28 cm Movement: Present     General:  Alert, oriented and cooperative. Patient is in no acute distress.  Skin: Skin is warm and dry. No rash noted.   Cardiovascular: Normal heart rate noted  Respiratory: Normal respiratory effort, no problems with respiration noted  Abdomen: Soft, gravid, appropriate for gestational age.  Pain/Pressure: Absent     Pelvic: Cervical exam deferred        Extremities: Normal range of motion.  Edema: None  Mental Status:  Normal mood and affect. Normal behavior. Normal judgment and thought content.   Assessment and Plan:  Pregnancy: G3P1011 at [redacted]w[redacted]d   2. [redacted] weeks gestation of pregnancy (Primary)  - Prenatal Vit-Fe Fumarate-FA (MULTIVITAMIN-PRENATAL) 27-0.8 MG TABS tablet; Take 1 tablet by mouth daily.  Dispense: 100 tablet; Refill: 1  3. Supervision of high risk pregnancy, antepartum Continue routine prenatal care  4. History of pelvic fracture   5. Multigravida of advanced maternal age in second trimester   Preterm  labor symptoms and general obstetric precautions including but not limited to vaginal bleeding, contractions, leaking of fluid and fetal movement were reviewed in detail with the patient.  Please refer to After Visit Summary for other counseling recommendations.   Return in about 2 weeks (around 04/04/2024) for ROB, in person.   Jerilynn Buddle, MD Faculty Attending [facility]

## 2024-03-22 LAB — CBC
Hematocrit: 34 % (ref 34.0–46.6)
Hemoglobin: 11.4 g/dL (ref 11.1–15.9)
MCH: 32 pg (ref 26.6–33.0)
MCHC: 33.5 g/dL (ref 31.5–35.7)
MCV: 96 fL (ref 79–97)
Platelets: 181 x10E3/uL (ref 150–450)
RBC: 3.56 x10E6/uL — ABNORMAL LOW (ref 3.77–5.28)
RDW: 12.2 % (ref 11.7–15.4)
WBC: 8.1 x10E3/uL (ref 3.4–10.8)

## 2024-03-22 LAB — GLUCOSE TOLERANCE, 2 HOURS W/ 1HR
Glucose, 1 hour: 93 mg/dL (ref 70–179)
Glucose, 2 hour: 72 mg/dL (ref 70–152)
Glucose, Fasting: 94 mg/dL — ABNORMAL HIGH (ref 70–91)

## 2024-03-22 LAB — RPR: RPR Ser Ql: NONREACTIVE

## 2024-03-22 LAB — HIV ANTIBODY (ROUTINE TESTING W REFLEX): HIV Screen 4th Generation wRfx: NONREACTIVE

## 2024-03-27 ENCOUNTER — Ambulatory Visit: Payer: Self-pay | Admitting: Obstetrics and Gynecology

## 2024-03-27 DIAGNOSIS — O24419 Gestational diabetes mellitus in pregnancy, unspecified control: Secondary | ICD-10-CM

## 2024-03-27 MED ORDER — ACCU-CHEK SOFTCLIX LANCETS MISC
12 refills | Status: DC
  Filled 2024-03-28: qty 100, 25d supply, fill #0
  Filled 2024-06-06: qty 100, 25d supply, fill #1

## 2024-03-27 MED ORDER — ACCU-CHEK GUIDE W/DEVICE KIT: 1.0000 | PACK | 0 refills | Status: DC | PRN

## 2024-03-27 MED ORDER — ACCU-CHEK GUIDE TEST VI STRP
ORAL_STRIP | 12 refills | Status: DC
  Filled 2024-03-28: qty 100, 25d supply, fill #0
  Filled 2024-06-06: qty 100, 25d supply, fill #1

## 2024-03-27 NOTE — Telephone Encounter (Signed)
-----   Message from Jerilynn DELENA Buddle sent at 03/27/2024 10:20 AM EDT ----- Elevated fasting on 2 hour GTT, will need to schedule diabetic teaching for gestational diabetes ----- Message ----- From: Rebecka Memos Lab Results In Sent: 03/22/2024   8:37 AM EDT To: Jerilynn DELENA Buddle, MD

## 2024-03-27 NOTE — Telephone Encounter (Signed)
 I called [place] and reviewed result and recommendation. She agreed to appointment with Diabetes educator for tomorrow. I also informed her I am sending diabetes supplies to her pharmacy for her to pick up and take to her diabetes appointment if possible so educator can show her how to use. She voices understanding. Rock Skip PEAK

## 2024-03-28 ENCOUNTER — Encounter: Attending: Obstetrics and Gynecology | Admitting: Dietician

## 2024-03-28 ENCOUNTER — Other Ambulatory Visit: Payer: Self-pay

## 2024-03-28 ENCOUNTER — Other Ambulatory Visit (HOSPITAL_COMMUNITY): Payer: Self-pay

## 2024-03-28 ENCOUNTER — Ambulatory Visit

## 2024-03-28 DIAGNOSIS — O24419 Gestational diabetes mellitus in pregnancy, unspecified control: Secondary | ICD-10-CM | POA: Insufficient documentation

## 2024-03-28 DIAGNOSIS — Z3A27 27 weeks gestation of pregnancy: Secondary | ICD-10-CM | POA: Diagnosis not present

## 2024-03-28 DIAGNOSIS — Z713 Dietary counseling and surveillance: Secondary | ICD-10-CM | POA: Insufficient documentation

## 2024-03-28 MED ORDER — ACCU-CHEK GUIDE W/DEVICE KIT
PACK | 0 refills | Status: DC
  Filled 2024-03-28: qty 1, 30d supply, fill #0

## 2024-03-28 NOTE — Progress Notes (Signed)
 Patient was seen for Gestational Diabetes on 03/28/24  Start time 1110 and End time 1210   Patient is here today with the child's father's grandmother. She is having a problem filling her AccuChek strips and lancets script at [facility].  She is on Medicaid.  [facility] called and they will fill this prescription.  Estimated due date: June 26, 2024; [redacted]w[redacted]d  Clinical: Medications: baby aspiring, prenatal vitamin Medical History: GDM (03/2024) Labs: OGTT fasting 94, 1 hour 93, 2 hour 72 on 03/21/2024, A1c 5% on 12/13/2023  Dietary and Lifestyle History: Patient lives with her mom.  She is doing the shopping and cooking.  She is currently not working.  She is waiting to see if she will be hired for a job that she has interviewed for. She has a [facility] appointment.  Physical Activity: none Stress: medium Sleep: good  24 hr Recall:  First Meal:  none Snack:  NABS Second meal:  Ooddles of Noodles Snack:  chips Third meal:  salad, steak and cheese sub Snack:  NABS Beverages:  water, juice, occasional regular soda (cut back to 1 per day)  NUTRITION INTERVENTION  Nutrition education (E-1) on the following topics:   Initial Follow-up  [x]  []  Definition of Gestational Diabetes [x]  []  Why dietary management is important in controlling blood glucose [x]  []  Effects each nutrient has on blood glucose levels [x]  []  Simple carbohydrates vs complex carbohydrates [x]  []  Fluid intake [x]  []  Creating a balanced meal plan []  []  Carbohydrate counting  [x]  []  When to check blood glucose levels [x]  []  Proper blood glucose monitoring techniques [x]  []  Effect of stress and stress reduction techniques  [x]  []  Exercise effect on blood glucose levels, appropriate exercise during pregnancy []  []  Importance of limiting caffeine and abstaining from alcohol  and smoking [x]  []  Medications used for blood sugar control during pregnancy [x]  []  Hypoglycemia and rule of 15 [x]  []  Postpartum self  care  Blood glucose monitor given: Accu Chek Guid Me Lot # [id number] Exp: 11/07/2024 CBG: 118 mg/dL after NABS  Patient instructed to monitor glucose levels: FBS: 60 - <= 95 mg/dL; 2 hour: <= 879 mg/dL  Patient received handouts: Nutrition Diabetes and Pregnancy Carbohydrate Counting List Blood glucose log Snack ideas for diabetes during pregnancy  Patient will be seen for follow-up as needed.

## 2024-03-29 ENCOUNTER — Other Ambulatory Visit: Payer: Self-pay

## 2024-04-04 ENCOUNTER — Ambulatory Visit: Admitting: Family Medicine

## 2024-04-04 ENCOUNTER — Other Ambulatory Visit: Payer: Self-pay

## 2024-04-04 VITALS — BP 121/79 | HR 97 | Wt 204.0 lb

## 2024-04-04 DIAGNOSIS — O24419 Gestational diabetes mellitus in pregnancy, unspecified control: Secondary | ICD-10-CM | POA: Insufficient documentation

## 2024-04-04 DIAGNOSIS — Z23 Encounter for immunization: Secondary | ICD-10-CM

## 2024-04-04 DIAGNOSIS — O0993 Supervision of high risk pregnancy, unspecified, third trimester: Secondary | ICD-10-CM

## 2024-04-04 DIAGNOSIS — O24410 Gestational diabetes mellitus in pregnancy, diet controlled: Secondary | ICD-10-CM

## 2024-04-04 DIAGNOSIS — Z3A28 28 weeks gestation of pregnancy: Secondary | ICD-10-CM | POA: Diagnosis not present

## 2024-04-04 DIAGNOSIS — O09523 Supervision of elderly multigravida, third trimester: Secondary | ICD-10-CM | POA: Diagnosis not present

## 2024-04-04 DIAGNOSIS — O0990 Supervision of high risk pregnancy, unspecified, unspecified trimester: Secondary | ICD-10-CM

## 2024-04-04 NOTE — Progress Notes (Signed)
   PRENATAL VISIT NOTE  Subjective:  Hannah Burns is a 33 y.o. G3P1011 at [redacted]w[redacted]d being seen today for ongoing prenatal care.  She is currently monitored for the following issues for this low-risk pregnancy and has Supervision of high risk pregnancy, antepartum; AMA (advanced maternal age) multigravida 35+; History of pelvic fracture; and Gestational diabetes on their problem list.  Patient reports no complaints.  Contractions: Not present. Vag. Bleeding: None.  Movement: Present. Denies leaking of fluid.   The following portions of the patient's history were reviewed and updated as appropriate: allergies, current medications, past family history, past medical history, past social history, past surgical history and problem list.   Objective:    Vitals:   04/04/24 1026  BP: 121/79  Pulse: 97  Weight: 204 lb (92.5 kg)    Fetal Status:  Fetal Heart Rate (bpm): 154 Fundal Height: 30 cm Movement: Present    General: Alert, oriented and cooperative. Patient is in no acute distress.  Skin: Skin is warm and dry. No rash noted.   Cardiovascular: Normal heart rate noted  Respiratory: Normal respiratory effort, no problems with respiration noted  Abdomen: Soft, gravid, appropriate for gestational age.  Pain/Pressure: Absent     Pelvic: Cervical exam deferred        Extremities: Normal range of motion.  Edema: None  Mental Status: Normal mood and affect. Normal behavior. Normal judgment and thought content.   Assessment and Plan:  Pregnancy: G3P1011 at [redacted]w[redacted]d 1. [redacted] weeks gestation of pregnancy (Primary)  - Tdap vaccine greater than or equal to 35yo IM  2. Supervision of high risk pregnancy, antepartum Continue prenatal care.  3. Multigravida of advanced maternal age in third trimester LF NIPT  4. Diet controlled gestational diabetes mellitus (GDM) in third trimester See log     Diet controlled Last EFW 16%  Preterm labor symptoms and general obstetric precautions including  but not limited to vaginal bleeding, contractions, leaking of fluid and fetal movement were reviewed in detail with the patient. Please refer to After Visit Summary for other counseling recommendations.   Return in 2 weeks (on 04/18/2024).  Future Appointments  Date Time Provider Department Center  04/18/2024 10:15 AM Nicholaus Burnard HERO, MD [facility]  05/02/2024  3:30 PM [facility] PROVIDER 1 [facility]  05/02/2024  3:45 PM [facility] US3 [facility]  05/08/2024 11:15 AM Anyanwu, Gloris LABOR, MD [facility]  05/22/2024  3:15 PM Ilean, Norleen GAILS, MD [facility]    Glenys GORMAN Birk, MD

## 2024-04-18 ENCOUNTER — Ambulatory Visit (INDEPENDENT_AMBULATORY_CARE_PROVIDER_SITE_OTHER): Admitting: Family Medicine

## 2024-04-18 ENCOUNTER — Other Ambulatory Visit: Payer: Self-pay

## 2024-04-18 VITALS — BP 105/67 | HR 76 | Wt 204.4 lb

## 2024-04-18 DIAGNOSIS — Z23 Encounter for immunization: Secondary | ICD-10-CM | POA: Diagnosis not present

## 2024-04-18 DIAGNOSIS — O0990 Supervision of high risk pregnancy, unspecified, unspecified trimester: Secondary | ICD-10-CM

## 2024-04-18 DIAGNOSIS — O24410 Gestational diabetes mellitus in pregnancy, diet controlled: Secondary | ICD-10-CM | POA: Diagnosis not present

## 2024-04-18 DIAGNOSIS — Z8781 Personal history of (healed) traumatic fracture: Secondary | ICD-10-CM

## 2024-04-18 DIAGNOSIS — O09523 Supervision of elderly multigravida, third trimester: Secondary | ICD-10-CM

## 2024-04-18 DIAGNOSIS — O0993 Supervision of high risk pregnancy, unspecified, third trimester: Secondary | ICD-10-CM

## 2024-04-18 DIAGNOSIS — Z3A30 30 weeks gestation of pregnancy: Secondary | ICD-10-CM

## 2024-04-18 NOTE — Addendum Note (Signed)
 Addended by: JOSHUA METH C on: 04/18/2024 11:19 AM   Modules accepted: Orders

## 2024-04-18 NOTE — Progress Notes (Signed)
   Subjective:  Hannah Burns is a 33 y.o. G3P1011 at [redacted]w[redacted]d being seen today for ongoing prenatal care.  She is currently monitored for the following issues for this high-risk pregnancy and has Supervision of high risk pregnancy, antepartum; AMA (advanced maternal age) multigravida 35+; History of pelvic fracture; and Gestational diabetes on their problem list.  Patient reports no complaints.  Contractions: Not present. Vag. Bleeding: None.  Movement: Present. Denies leaking of fluid.   The following portions of the patient's history were reviewed and updated as appropriate: allergies, current medications, past family history, past medical history, past social history, past surgical history and problem list. Problem list updated.  Objective:   Vitals:   04/18/24 1024  BP: 105/67  Pulse: 76  Weight: 204 lb 6 oz (92.7 kg)    Fetal Status: Fetal Heart Rate (bpm): 148   Movement: Present     General:  Alert, oriented and cooperative. Patient is in no acute distress.  Skin: Skin is warm and dry. No rash noted.   Cardiovascular: Normal heart rate noted  Respiratory: Normal respiratory effort, no problems with respiration noted  Abdomen: Soft, gravid, appropriate for gestational age. Pain/Pressure: Absent     Pelvic: Vag. Bleeding: None     Cervical exam deferred        Extremities: Normal range of motion.  Edema: None  Mental Status: Normal mood and affect. Normal behavior. Normal judgment and thought content.   Urinalysis:      Assessment and Plan:  Pregnancy: G3P1011 at [redacted]w[redacted]d  1. Supervision of high risk pregnancy, antepartum (Primary) BP and FHR normal Accepts flu today  2. Diet controlled gestational diabetes mellitus (GDM) in third trimester Forgot log On recall reports fastings around 95-97, post prandials around 100-110's Last growth US  03/14/2024, EFW 16%, AC 40% Repeat scheduled for 05/02/2024  3. Multigravida of advanced maternal age in third trimester ASA  4.  History of pelvic fracture Sacroiliac fixation with hardware already removed, no clear contraindication for vaginal delivery  Preterm labor symptoms and general obstetric precautions including but not limited to vaginal bleeding, contractions, leaking of fluid and fetal movement were reviewed in detail with the patient. Please refer to After Visit Summary for other counseling recommendations.  Return in 2 weeks (on 05/02/2024) for [facility], ob visit.   Lola Donnice HERO, MD

## 2024-04-18 NOTE — Patient Instructions (Signed)

## 2024-05-02 ENCOUNTER — Ambulatory Visit (HOSPITAL_BASED_OUTPATIENT_CLINIC_OR_DEPARTMENT_OTHER): Admitting: Obstetrics and Gynecology

## 2024-05-02 ENCOUNTER — Ambulatory Visit: Attending: Obstetrics and Gynecology

## 2024-05-02 VITALS — BP 123/70

## 2024-05-02 DIAGNOSIS — O24410 Gestational diabetes mellitus in pregnancy, diet controlled: Secondary | ICD-10-CM | POA: Diagnosis present

## 2024-05-02 DIAGNOSIS — O0990 Supervision of high risk pregnancy, unspecified, unspecified trimester: Secondary | ICD-10-CM | POA: Insufficient documentation

## 2024-05-02 DIAGNOSIS — Z3A32 32 weeks gestation of pregnancy: Secondary | ICD-10-CM | POA: Insufficient documentation

## 2024-05-02 DIAGNOSIS — E669 Obesity, unspecified: Secondary | ICD-10-CM

## 2024-05-02 DIAGNOSIS — O09522 Supervision of elderly multigravida, second trimester: Secondary | ICD-10-CM | POA: Insufficient documentation

## 2024-05-02 DIAGNOSIS — O358XX0 Maternal care for other (suspected) fetal abnormality and damage, not applicable or unspecified: Secondary | ICD-10-CM | POA: Diagnosis present

## 2024-05-02 DIAGNOSIS — O99212 Obesity complicating pregnancy, second trimester: Secondary | ICD-10-CM | POA: Diagnosis not present

## 2024-05-02 DIAGNOSIS — O09523 Supervision of elderly multigravida, third trimester: Secondary | ICD-10-CM | POA: Insufficient documentation

## 2024-05-02 DIAGNOSIS — O35EXX0 Maternal care for other (suspected) fetal abnormality and damage, fetal genitourinary anomalies, not applicable or unspecified: Secondary | ICD-10-CM | POA: Diagnosis not present

## 2024-05-02 NOTE — Progress Notes (Signed)
   Patient information  Patient Name: Hannah Burns  Patient MRN:   [id number]  Referring practice: MFM Referring Provider: [facility])  Problem List   Patient Active Problem List   Diagnosis Date Noted   Gestational diabetes 04/04/2024   History of pelvic fracture 12/13/2023   Supervision of high risk pregnancy, antepartum 12/08/2023   AMA (advanced maternal age) multigravida 35+ 12/08/2023   Maternal Fetal medicine Consult  Hannah Burns is a 33 y.o. G3P1011 at [redacted]w[redacted]d here for ultrasound and consultation. Hannah Burns is doing well today with no acute concerns. Today we focused on the following:   The patient is here due to having a pregnancy complicated by gestational diabetes which is well-controlled with her diet.  We discussed the importance of serial growth ultrasounds and the importance of continued to have good diabetic control during pregnancy to minimize the risk of adverse obstetric outcomes.  I congratulated the patient that her blood sugars are all near goal.  She will return for 37-week ultrasound.  The patient had time to ask questions that were answered to her satisfaction.  She verbalized understanding and agrees to proceed with the plan below.  Recommendations -F/u growth US  at 37 weeks -Continue diabetic diet  I spent 20 minutes reviewing the patients chart, including labs and images as well as counseling the patient about her medical conditions. Greater than 50% of the time was spent in direct face-to-face patient counseling.  Delora Smaller  MFM, [place]   05/02/2024  4:25 PM   Review of Systems: A review of systems was performed and was negative except per HPI   Vitals and Physical Exam    05/02/2024    3:25 PM 04/18/2024   10:24 AM 04/04/2024   10:26 AM  Vitals with BMI  Weight  204 lbs 6 oz 204 lbs  Systolic 123 105 878  Diastolic 70 67 79  Pulse  76 97    Sitting comfortably on the sonogram  table Nonlabored breathing Normal rate and rhythm Abdomen is nontender  Past pregnancies OB History  Gravida Para Term Preterm AB Living  3 1 1  1 1   SAB IAB Ectopic Multiple Live Births  1    1    # Outcome Date GA Lbr Len/2nd Weight Sex Type Anes PTL Lv  3 Current           2 SAB 2023          1 Term 09/14/13 [redacted]w[redacted]d 08:01 / 00:16 6 lb 5.1 oz (2.866 kg) F Vag-Spont EPI  LIV     Future Appointments  Date Time Provider Department Center  05/08/2024 11:15 AM Anyanwu, Gloris LABOR, MD [facility]  05/22/2024  3:15 PM Ilean Norleen GAILS, MD [facility]  05/29/2024  4:15 PM Ilean Norleen GAILS, MD [facility]  06/06/2024  2:35 PM Zina Jerilynn LABOR, MD [facility]  06/13/2024  3:15 PM Izell Harari, MD [facility]  06/19/2024  3:55 PM Eveline Lynwood MATSU, MD [facility]  06/26/2024  2:35 PM Ilean, Norleen GAILS, MD [facility]

## 2024-05-08 ENCOUNTER — Encounter: Payer: Self-pay | Admitting: Obstetrics & Gynecology

## 2024-05-08 ENCOUNTER — Ambulatory Visit: Admitting: Obstetrics & Gynecology

## 2024-05-08 ENCOUNTER — Other Ambulatory Visit: Payer: Self-pay

## 2024-05-08 VITALS — BP 112/78 | HR 81 | Wt 205.0 lb

## 2024-05-08 DIAGNOSIS — Z3A33 33 weeks gestation of pregnancy: Secondary | ICD-10-CM

## 2024-05-08 DIAGNOSIS — Z8781 Personal history of (healed) traumatic fracture: Secondary | ICD-10-CM

## 2024-05-08 DIAGNOSIS — O0990 Supervision of high risk pregnancy, unspecified, unspecified trimester: Secondary | ICD-10-CM

## 2024-05-08 DIAGNOSIS — O09523 Supervision of elderly multigravida, third trimester: Secondary | ICD-10-CM

## 2024-05-08 DIAGNOSIS — O24410 Gestational diabetes mellitus in pregnancy, diet controlled: Secondary | ICD-10-CM

## 2024-05-08 NOTE — Progress Notes (Signed)
 PRENATAL VISIT NOTE  Subjective:  Hannah Burns is a 33 y.o. G3P1011 at [redacted]w[redacted]d being seen today for ongoing prenatal care.  She is currently monitored for the following issues for this high-risk pregnancy and has Supervision of high risk pregnancy, antepartum; AMA (advanced maternal age) multigravida 35+; History of pelvic fracture; and Gestational diabetes on their problem list.  Patient reports no complaints.  Contractions: Irritability. Vag. Bleeding: None.  Movement: Present. Denies leaking of fluid.   The following portions of the patient's history were reviewed and updated as appropriate: allergies, current medications, past family history, past medical history, past social history, past surgical history and problem list.   Objective:   Vitals:   05/08/24 1136  BP: 112/78  Pulse: 81  Weight: 205 lb (93 kg)    Fetal Status:  Fetal Heart Rate (bpm): 147   Movement: Present    General: Alert, oriented and cooperative. Patient is in no acute distress.  Skin: Skin is warm and dry. No rash noted.   Cardiovascular: Normal heart rate noted  Respiratory: Normal respiratory effort, no problems with respiration noted  Abdomen: Soft, gravid, appropriate for gestational age.  Pain/Pressure: Absent     Pelvic: Cervical exam deferred        Extremities: Normal range of motion.  Edema: None  Mental Status: Normal mood and affect. Normal behavior. Normal judgment and thought content.      12/13/2023    3:05 PM 11/08/2023    4:47 PM 02/10/2023    1:55 PM  Depression screen PHQ 2/9  Decreased Interest 2 2 2   Down, Depressed, Hopeless 0 0 1  PHQ - 2 Score 2 2 3   Altered sleeping 2 0 3  Tired, decreased energy 1 1 2   Change in appetite 1 1 3   Feeling bad or failure about yourself  0 0 1  Trouble concentrating 0 0 0  Moving slowly or fidgety/restless 0 0 0  Suicidal thoughts 0 0 0  PHQ-9 Score 6  4  12    Difficult doing work/chores   Somewhat difficult     Data saved with a  previous flowsheet row definition        12/13/2023    3:05 PM 11/08/2023    4:47 PM 02/10/2023    1:55 PM 12/08/2018    9:22 AM  GAD 7 : Generalized Anxiety Score  Nervous, Anxious, on Edge 0 0 1 0  Control/stop worrying 0 0 2 1  Worry too much - different things 1 0 2 0  Trouble relaxing 0 0 1 0  Restless 0 0 0 0  Easily annoyed or irritable 1 1 3 1   Afraid - awful might happen 0 0 1 0  Total GAD 7 Score 2 1 10 2   Anxiety Difficulty   Somewhat difficult     Assessment and Plan:  Pregnancy: G3P1011 at [redacted]w[redacted]d 1. Diet controlled gestational diabetes mellitus (GDM) in third trimester (Primary) Did not bring log, reports sugars in normal range (fastings 80s, PP mostly in 100-110s, no higher than 120). Next growth scan is on 06/12/2024.  Encouraged to bring log next visit.  2. Multigravida of advanced maternal age in third trimester LR NIPS  3. History of pelvic fracture Has sacro-iliac pin in place. This is not a known contraindication to vaginal delivery.  4. [redacted] weeks gestation of pregnancy 5. Supervision of high risk pregnancy, antepartum Gave information about RSV vaccine. Preterm labor symptoms and general obstetric precautions including but not limited to  vaginal bleeding, contractions, leaking of fluid and fetal movement were reviewed in detail with the patient. Please refer to After Visit Summary for other counseling recommendations.   Return in about 2 weeks (around 05/22/2024) for OFFICE OB VISIT (MD or APP).  Future Appointments  Date Time Provider Department Center  05/22/2024  3:15 PM Ilean Norleen GAILS, MD [facility]  05/29/2024  4:15 PM Ilean Norleen GAILS, MD [facility]  06/06/2024  2:35 PM Zina Jerilynn LABOR, MD [facility]  06/12/2024  2:15 PM [facility] PROVIDER 1 [facility]  06/12/2024  2:30 PM [facility] US1 [facility]  06/13/2024  3:15 PM Izell Harari, MD [facility]  06/19/2024  3:55 PM Eveline Lynwood MATSU, MD [facility]  06/26/2024  2:35 PM Ilean, Norleen GAILS, MD [facility]     Gloris Hugger, MD

## 2024-05-08 NOTE — Patient Instructions (Signed)
 Return to office for any scheduled appointments. Call the office or go to the MAU at [facility] at [facility] if: You begin to have strong, frequent contractions Your water breaks.  Sometimes it is a big gush of fluid, sometimes it is just a trickle that keeps getting your underwear wet or running down your legs You have vaginal bleeding.  It is normal to have a small amount of spotting if your cervix was checked.  You do not feel your baby moving like normal.  If you do not, get something to eat and drink and lay down and focus on feeling your baby move.   If your baby is still not moving like normal, you should call the office or go to MAU. Any other obstetric concerns.     RSV Vaccination for Pregnant People  CDC recommends two ways to protect babies from getting very sick with Respiratory Syncytial Virus (RSV):  An RSV vaccination given during pregnancy  Pfizer's vaccine Verdis Frederickson) is recommended for use during pregnancy. It is given during RSV season to people who are 32 through [redacted] weeks pregnant.  Or, An RSV immunization given directly to infants and some older babies  Babies born to mothers who get RSV vaccine at least 2 weeks before delivery will have protection and, in most cases, should not need an RSV immunization later.    When is RSV season?  In most regions of the [place] RSV season starts in the fall and peaks in the winter, but the timing and severity of RSV season can vary from place to place and year to year.   The goal of maternal RSV vaccination is to protect babies from getting very sick with RSV during their first RSV season.  In most of the [place], this means maternal RSV vaccine will be given in September through January.  Who should get the maternal RSV vaccine?  People who are 79 through [redacted] weeks pregnant during September through January should get one dose of maternal RSV vaccine to protect their babies. RSV season  can vary around the country.   How is the maternal RSV vaccine administered?  Maternal RSV vaccine is given as a shot into the mother's upper arm. Only a single dose (one shot) of maternal RSV vaccine is recommended.   It is not yet known whether another dose might be needed in later pregnancies.  How well does the maternal RSV vaccine work?  When someone gets RSV vaccine, their body responds by making a protein that protects against the virus that causes RSV. The process takes about 2 weeks. When a pregnant person gets RSV vaccine, their protective proteins (called antibodies) also pass to their baby. So, babies who are born at least 2 weeks after their mother gets RSV vaccine are protected at birth, when infants are at the highest risk of severe RSV disease.   The vaccine can reduce a baby's risk of being hospitalized from RSV by 57% in the first six months after birth.  What are the possible side effects of the maternal RSV vaccine?  In the clinical trials, the side effects most often reported by pregnant people who received the maternal RSV vaccine were pain at the injection site, headache, muscle pain, and nausea.  Although not common, a dangerous high blood pressure condition called pre-eclampsia occurred in 1.8% of pregnant people who received the maternal RSV vaccine compared to 1.4% of pregnant people who received a placebo.  The clinical  trials identified a small increase in the number of preterm births in vaccinated pregnant people. It is not clear if this is a true safety problem related to RSV vaccine or if this occurred for reasons unrelated to vaccination.  To reduce the potential risk of preterm birth and complications from RSV disease, FDA approved the maternal RSV vaccine for use during weeks 32 through 30 of pregnancy while additional studies are conducted.  FDA is requiring the manufacturer to do additional studies that will look more closely at the potential risk of  preterm births and pregnancy-related high blood pressure issues in mothers, including pre-eclampsia.  Severe allergic reactions to vaccines are rare but can happen after any vaccine and can be life-threatening. If you see signs of a severe allergic reaction after vaccination (hives, swelling of the face and throat, difficulty breathing, a fast heartbeat, dizziness, or weakness), seek immediate medical care by calling 911.  As with any medicine or vaccine there is a very remote chance of the vaccine causing other serious injury or death after vaccination.  Adverse events following vaccination should be reported to the Vaccine Adverse Event Reporting System (VAERS), even if it's not clear that the vaccine caused the adverse event. You or your doctor can report an adverse event to [employer] and FDA through VAERS. If you need further assistance reporting to VAERS, please email [email or web address] [email or web address] or call [phone].  If you have any questions about side effects from the maternal RSV vaccine, talk with your healthcare provider.  Do I need a prescription for a maternal RSV vaccine?  Until the vaccine available in the office, you will need a prescription to take to a local pharmacy that is providing the vaccine.   How do I pay for the maternal RSV vaccine?  Most private health insurance plans cover the maternal RSV vaccine, but there may be a cost to you depending on your plan.  Contact your insurer to find out.  Medicaid Beginning March 08, 2022, most people with coverage from [facility] and [employer] [facility]) will be guaranteed coverage of all vaccines recommended by the Advisory Committee on Immunization Practice at no cost to them.   Source: [facility] for Immunization and Respiratory Diseases

## 2024-05-22 ENCOUNTER — Other Ambulatory Visit: Payer: Self-pay

## 2024-05-22 ENCOUNTER — Ambulatory Visit: Admitting: Family Medicine

## 2024-05-22 VITALS — BP 121/79 | HR 81 | Wt 209.2 lb

## 2024-05-22 DIAGNOSIS — Z8781 Personal history of (healed) traumatic fracture: Secondary | ICD-10-CM

## 2024-05-22 DIAGNOSIS — Z3A35 35 weeks gestation of pregnancy: Secondary | ICD-10-CM

## 2024-05-22 DIAGNOSIS — O0993 Supervision of high risk pregnancy, unspecified, third trimester: Secondary | ICD-10-CM | POA: Diagnosis not present

## 2024-05-22 DIAGNOSIS — O0990 Supervision of high risk pregnancy, unspecified, unspecified trimester: Secondary | ICD-10-CM

## 2024-05-22 DIAGNOSIS — O09523 Supervision of elderly multigravida, third trimester: Secondary | ICD-10-CM

## 2024-05-22 DIAGNOSIS — O24410 Gestational diabetes mellitus in pregnancy, diet controlled: Secondary | ICD-10-CM

## 2024-05-23 NOTE — Progress Notes (Signed)
 PRENATAL VISIT NOTE  Subjective:  Hannah Burns is a 33 y.o. G3P1011 at [redacted]w[redacted]d being seen today for ongoing prenatal care.  She is currently monitored for the following issues for this high-risk pregnancy and has Supervision of high risk pregnancy, antepartum; AMA (advanced maternal age) multigravida 35+; History of pelvic fracture; and Gestational diabetes on their problem list.  Patient reports no bleeding, no contractions, no cramping, and no leaking.  Contractions: Irritability. Vag. Bleeding: None.  Movement: Present. Denies leaking of fluid.   The following portions of the patient's history were reviewed and updated as appropriate: allergies, current medications, past family history, past medical history, past social history, past surgical history and problem list.   Objective:   Vitals:   05/22/24 1530  BP: 121/79  Pulse: 81  Weight: 209 lb 3.2 oz (94.9 kg)    Fetal Status:  Fetal Heart Rate (bpm): 145   Movement: Present    General: Alert, oriented and cooperative. Patient is in no acute distress.  Skin: Skin is warm and dry. No rash noted.   Cardiovascular: Normal heart rate noted  Respiratory: Normal respiratory effort, no problems with respiration noted  Abdomen: Soft, gravid, appropriate for gestational age.  Pain/Pressure: Present     Pelvic: Cervical exam deferred        Extremities: Normal range of motion.  Edema: None  Mental Status: Normal mood and affect. Normal behavior. Normal judgment and thought content.      12/13/2023    3:05 PM 11/08/2023    4:47 PM 02/10/2023    1:55 PM  Depression screen PHQ 2/9  Decreased Interest 2 2 2   Down, Depressed, Hopeless 0 0 1  PHQ - 2 Score 2 2 3   Altered sleeping 2 0 3  Tired, decreased energy 1 1 2   Change in appetite 1 1 3   Feeling bad or failure about yourself  0 0 1  Trouble concentrating 0 0 0  Moving slowly or fidgety/restless 0 0 0  Suicidal thoughts 0 0 0  PHQ-9 Score 6  4  12    Difficult doing  work/chores   Somewhat difficult     Data saved with a previous flowsheet row definition        12/13/2023    3:05 PM 11/08/2023    4:47 PM 02/10/2023    1:55 PM 12/08/2018    9:22 AM  GAD 7 : Generalized Anxiety Score  Nervous, Anxious, on Edge 0 0 1 0  Control/stop worrying 0 0 2 1  Worry too much - different things 1 0 2 0  Trouble relaxing 0 0 1 0  Restless 0 0 0 0  Easily annoyed or irritable 1 1 3 1   Afraid - awful might happen 0 0 1 0  Total GAD 7 Score 2 1 10 2   Anxiety Difficulty   Somewhat difficult     Assessment and Plan:  Pregnancy: G3P1011 at [redacted]w[redacted]d 1. Supervision of high risk pregnancy, antepartum (Primary) FHR BP appropriate today  2. History of pelvic fracture Had sacroiliac pin placed but patient reports it was removed.  Per chart review is not a contraindication to vaginal delivery.  3. Multigravida of advanced maternal age in third trimester Low risk nips  4. Diet controlled gestational diabetes mellitus (GDM) in third trimester Blood glucose is well-controlled with most fastings in the 80s.  Most postprandials in the low 100s to 110s.  Will continue to monitor.  Plan for induction at 39 weeks.  Discussed with  patient.  She would like swabs at next visit.  5. [redacted] weeks gestation of pregnancy   Preterm labor symptoms and general obstetric precautions including but not limited to vaginal bleeding, contractions, leaking of fluid and fetal movement were reviewed in detail with the patient. Please refer to After Visit Summary for other counseling recommendations.   No follow-ups on file.  Future Appointments  Date Time Provider Department Center  05/29/2024  4:15 PM Ilean Norleen GAILS, MD [facility]  06/06/2024  2:35 PM Zina Jerilynn LABOR, MD [facility]  06/12/2024  2:15 PM [facility] PROVIDER 1 [facility]  06/12/2024  2:30 PM [facility] US1 [facility]  06/13/2024  3:15 PM Izell Harari, MD [facility]  06/19/2024  3:55 PM Eveline Lynwood MATSU, MD [facility]  06/26/2024   2:35 PM Ilean, Norleen GAILS, MD [facility]    Norleen GAILS Ilean, MD

## 2024-05-26 ENCOUNTER — Other Ambulatory Visit (HOSPITAL_BASED_OUTPATIENT_CLINIC_OR_DEPARTMENT_OTHER): Payer: Self-pay

## 2024-05-29 ENCOUNTER — Other Ambulatory Visit: Payer: Self-pay

## 2024-05-29 ENCOUNTER — Ambulatory Visit (INDEPENDENT_AMBULATORY_CARE_PROVIDER_SITE_OTHER): Admitting: Family Medicine

## 2024-05-29 ENCOUNTER — Other Ambulatory Visit (HOSPITAL_COMMUNITY)
Admission: RE | Admit: 2024-05-29 | Discharge: 2024-05-29 | Disposition: A | Discharge status: Home / Self Care | Source: Ambulatory Visit | Attending: Family Medicine | Admitting: Family Medicine

## 2024-05-29 VITALS — BP 118/77 | HR 76 | Wt 212.6 lb

## 2024-05-29 DIAGNOSIS — O09523 Supervision of elderly multigravida, third trimester: Secondary | ICD-10-CM | POA: Diagnosis not present

## 2024-05-29 DIAGNOSIS — O0990 Supervision of high risk pregnancy, unspecified, unspecified trimester: Secondary | ICD-10-CM

## 2024-05-29 DIAGNOSIS — O0993 Supervision of high risk pregnancy, unspecified, third trimester: Secondary | ICD-10-CM | POA: Diagnosis not present

## 2024-05-29 DIAGNOSIS — Z8781 Personal history of (healed) traumatic fracture: Secondary | ICD-10-CM

## 2024-05-29 DIAGNOSIS — O24410 Gestational diabetes mellitus in pregnancy, diet controlled: Secondary | ICD-10-CM

## 2024-05-29 DIAGNOSIS — Z3A36 36 weeks gestation of pregnancy: Secondary | ICD-10-CM

## 2024-05-29 NOTE — Progress Notes (Signed)
 "  PRENATAL VISIT NOTE  Subjective:  Hannah Burns is a 33 y.o. G3P1011 at [redacted]w[redacted]d being seen today for ongoing prenatal care.  She is currently monitored for the following issues for this high-risk pregnancy and has Supervision of high risk pregnancy, antepartum; AMA (advanced maternal age) multigravida 35+; History of pelvic fracture; and Gestational diabetes on their problem list.  Patient reports no bleeding, no contractions, no cramping, and no leaking.  Contractions: Irritability. Vag. Bleeding: None.  Movement: Present. Denies leaking of fluid.   The following portions of the patient's history were reviewed and updated as appropriate: allergies, current medications, past family history, past medical history, past social history, past surgical history and problem list.   Objective:   Vitals:   05/29/24 1627  BP: 118/77  Pulse: 76  Weight: 212 lb 9.6 oz (96.4 kg)    Fetal Status:  Fetal Heart Rate (bpm): 144   Movement: Present    General: Alert, oriented and cooperative. Patient is in no acute distress.  Skin: Skin is warm and dry. No rash noted.   Cardiovascular: Normal heart rate noted  Respiratory: Normal respiratory effort, no problems with respiration noted  Abdomen: Soft, gravid, appropriate for gestational age.  Pain/Pressure: Present     Pelvic: Cervical exam performed in the presence of a chaperone      1/thick/-3  Extremities: Normal range of motion.     Mental Status: Normal mood and affect. Normal behavior. Normal judgment and thought content.      12/13/2023    3:05 PM 11/08/2023    4:47 PM 02/10/2023    1:55 PM  Depression screen PHQ 2/9  Decreased Interest 2 2 2   Down, Depressed, Hopeless 0 0 1  PHQ - 2 Score 2 2 3   Altered sleeping 2 0 3  Tired, decreased energy 1 1 2   Change in appetite 1 1 3   Feeling bad or failure about yourself  0 0 1  Trouble concentrating 0 0 0  Moving slowly or fidgety/restless 0 0 0  Suicidal thoughts 0 0 0  PHQ-9 Score 6  4   12    Difficult doing work/chores   Somewhat difficult     Data saved with a previous flowsheet row definition        12/13/2023    3:05 PM 11/08/2023    4:47 PM 02/10/2023    1:55 PM 12/08/2018    9:22 AM  GAD 7 : Generalized Anxiety Score  Nervous, Anxious, on Edge 0 0 1 0  Control/stop worrying 0 0 2 1  Worry too much - different things 1 0 2 0  Trouble relaxing 0 0 1 0  Restless 0 0 0 0  Easily annoyed or irritable 1 1 3 1   Afraid - awful might happen 0 0 1 0  Total GAD 7 Score 2 1 10 2   Anxiety Difficulty   Somewhat difficult     Assessment and Plan:  Pregnancy: G3P1011 at [redacted]w[redacted]d 1. Supervision of high risk pregnancy, antepartum (Primary) FHR BP appropriate today SVE 1/thick/-3 - GC/Chlamydia probe amp ([place])not at [facility] - Culture, beta strep (group b only)  2. History of pelvic fracture History of sacroiliac pin placed.  No contraindications to vaginal delivery.  3. Multigravida of advanced maternal age in third trimester Low risk nips  4. Diet controlled gestational diabetes mellitus (GDM) in third trimester Checking at least 3 times daily.  3/10 out of range over the last few days.  Counseled on checking  her blood sugars regularly.  Swabs collected today.  Plan for induction around 39 weeks unless blood glucoses change.  Scheduled for repeat ultrasound in 1/6.  5. [redacted] weeks gestation of pregnancy   Preterm labor symptoms and general obstetric precautions including but not limited to vaginal bleeding, contractions, leaking of fluid and fetal movement were reviewed in detail with the patient. Please refer to After Visit Summary for other counseling recommendations.   No follow-ups on file.  Future Appointments  Date Time Provider Department Center  06/06/2024  2:35 PM Zina Jerilynn LABOR, Hannah Burns [facility]  06/12/2024  2:15 PM [facility] PROVIDER 1 [facility]  06/12/2024  2:30 PM [facility] US1 [facility]  06/13/2024  3:15 PM Izell Harari, Hannah Burns [facility]  06/19/2024  3:55  PM Eveline Lynwood MATSU, Hannah Burns [facility]  06/26/2024  2:35 PM Burns, Hannah GAILS, Hannah Burns [facility]    Hannah GAILS Ilean, Hannah Burns  "

## 2024-05-30 LAB — GC/CHLAMYDIA PROBE AMP (~~LOC~~) NOT AT ARMC
Chlamydia: NEGATIVE
Comment: NEGATIVE
Comment: NORMAL
Neisseria Gonorrhea: NEGATIVE

## 2024-06-02 LAB — CULTURE, BETA STREP (GROUP B ONLY): Strep Gp B Culture: NEGATIVE

## 2024-06-06 ENCOUNTER — Ambulatory Visit (INDEPENDENT_AMBULATORY_CARE_PROVIDER_SITE_OTHER): Admitting: Obstetrics and Gynecology

## 2024-06-06 ENCOUNTER — Other Ambulatory Visit: Payer: Self-pay

## 2024-06-06 VITALS — BP 111/71 | HR 90 | Wt 211.6 lb

## 2024-06-06 DIAGNOSIS — Z8781 Personal history of (healed) traumatic fracture: Secondary | ICD-10-CM

## 2024-06-06 DIAGNOSIS — Z3A37 37 weeks gestation of pregnancy: Secondary | ICD-10-CM | POA: Diagnosis not present

## 2024-06-06 DIAGNOSIS — O0993 Supervision of high risk pregnancy, unspecified, third trimester: Secondary | ICD-10-CM

## 2024-06-06 DIAGNOSIS — O24410 Gestational diabetes mellitus in pregnancy, diet controlled: Secondary | ICD-10-CM

## 2024-06-06 DIAGNOSIS — O09523 Supervision of elderly multigravida, third trimester: Secondary | ICD-10-CM

## 2024-06-06 DIAGNOSIS — O0990 Supervision of high risk pregnancy, unspecified, unspecified trimester: Secondary | ICD-10-CM

## 2024-06-06 NOTE — Progress Notes (Signed)
" ° °  PRENATAL VISIT NOTE  Subjective:  Hannah Burns is a 33 y.o. G3P1011 at [redacted]w[redacted]d being seen today for ongoing prenatal care.  She is currently monitored for the following issues for this high-risk pregnancy and has Supervision of high risk pregnancy, antepartum; AMA (advanced maternal age) multigravida 35+; History of pelvic fracture; and Gestational diabetes on their problem list.  Patient doing well with no acute concerns today. She reports no complaints.  Contractions: Irregular. Vag. Bleeding: None.  Movement: Present. Denies leaking of fluid.   The following portions of the patient's history were reviewed and updated as appropriate: allergies, current medications, past family history, past medical history, past social history, past surgical history and problem list. Problem list updated.  Objective:   Vitals:   06/06/24 1451  BP: 111/71  Pulse: 90  Weight: 211 lb 9.6 oz (96 kg)    Fetal Status: Fetal Heart Rate (bpm): 151   Movement: Present     General:  Alert, oriented and cooperative. Patient is in no acute distress.  Skin: Skin is warm and dry. No rash noted.   Cardiovascular: Normal heart rate noted  Respiratory: Normal respiratory effort, no problems with respiration noted  Abdomen: Soft, gravid, appropriate for gestational age.  Pain/Pressure: Present (lower pelvic pain)     Pelvic: Cervical exam deferred        Extremities: Normal range of motion.  Edema: None  Mental Status:  Normal mood and affect. Normal behavior. Normal judgment and thought content.   Assessment and Plan:  Pregnancy: G3P1011 at [redacted]w[redacted]d  1. [redacted] weeks gestation of pregnancy (Primary)   2. Diet controlled gestational diabetes mellitus (GDM) in third trimester FBS: 79-95 PPBS: 73-125 Good blood glucose control IOL around 39 weeks  3. Supervision of high risk pregnancy, antepartum Continue routine prenatal care  4. History of pelvic fracture Per previous notes, pt ok for vaginal  delivery  5. Multigravida of advanced maternal age in third trimester   Term labor symptoms and general obstetric precautions including but not limited to vaginal bleeding, contractions, leaking of fluid and fetal movement were reviewed in detail with the patient.  Please refer to After Visit Summary for other counseling recommendations.   Return in about 1 week (around 06/13/2024) for [facility], in person.   Jerilynn Buddle, MD Faculty Attending Center for [facility]   "

## 2024-06-08 NOTE — L&D Delivery Note (Signed)
 Labor Progress Hannah Burns is a 34 y.o. female G3P1011 with IUP at [redacted]w[redacted]d admitted for IOL for FGR.  She progressed with augmentation to complete and pushed through 2-3 contractions to deliver.  Cord clamping delayed by 2-3 minutes then clamped by CNM and cut by FOB.  Placenta intact and spontaneous, bleeding minimal.  Tiny abrasion, hemostatic, on right labia not repaired.   Mom and baby stable prior to transfer to postpartum. She plans on breastfeeding. She requests Nexplanon for birth control.   Delivery Note At 7:45 PM a viable and healthy female was delivered via Vaginal, Spontaneous (Presentation: Right Occiput Anterior).  APGAR: 9, 9; weight  pending.   Placenta status: Spontaneous, Intact.  Cord: 3 vessels with the following complications: Knot.    Anesthesia: Epidural Episiotomy: None Lacerations: Labial Suture Repair: n/a Est. Blood Loss (mL): 114  Mom to postpartum.  Baby to Couplet care / Skin to Skin.  Olam [employer] 06/13/2024, 8:24 PM

## 2024-06-12 ENCOUNTER — Ambulatory Visit

## 2024-06-12 ENCOUNTER — Ambulatory Visit: Attending: Maternal & Fetal Medicine

## 2024-06-12 VITALS — BP 133/71 | HR 79

## 2024-06-12 DIAGNOSIS — O0990 Supervision of high risk pregnancy, unspecified, unspecified trimester: Secondary | ICD-10-CM | POA: Insufficient documentation

## 2024-06-12 DIAGNOSIS — O365930 Maternal care for other known or suspected poor fetal growth, third trimester, not applicable or unspecified: Secondary | ICD-10-CM

## 2024-06-12 DIAGNOSIS — E669 Obesity, unspecified: Secondary | ICD-10-CM | POA: Diagnosis not present

## 2024-06-12 DIAGNOSIS — Z3A38 38 weeks gestation of pregnancy: Secondary | ICD-10-CM | POA: Insufficient documentation

## 2024-06-12 DIAGNOSIS — O4103X0 Oligohydramnios, third trimester, not applicable or unspecified: Secondary | ICD-10-CM | POA: Insufficient documentation

## 2024-06-12 DIAGNOSIS — O09523 Supervision of elderly multigravida, third trimester: Secondary | ICD-10-CM | POA: Insufficient documentation

## 2024-06-12 DIAGNOSIS — O99213 Obesity complicating pregnancy, third trimester: Secondary | ICD-10-CM | POA: Diagnosis not present

## 2024-06-12 DIAGNOSIS — O4100X0 Oligohydramnios, unspecified trimester, not applicable or unspecified: Secondary | ICD-10-CM | POA: Insufficient documentation

## 2024-06-12 DIAGNOSIS — O365920 Maternal care for other known or suspected poor fetal growth, second trimester, not applicable or unspecified: Secondary | ICD-10-CM | POA: Insufficient documentation

## 2024-06-12 DIAGNOSIS — O24410 Gestational diabetes mellitus in pregnancy, diet controlled: Secondary | ICD-10-CM

## 2024-06-12 NOTE — Procedures (Signed)
 Hannah Burns 1991/01/04 [redacted]w[redacted]d  Fetus A Non-Stress Test Interpretation for 06/12/2024  Indication: IUGR, low AFI  Fetal Heart Rate A Mode: External Baseline Rate (A): 140 bpm Variability: Moderate Accelerations: 15 x 15 Decelerations: None Multiple birth?: No  Uterine Activity Mode: Palpation, Toco Contraction Frequency (min): 1 uc Contraction Duration (sec): 50 Contraction Quality: Mild Resting Tone Palpated: Relaxed Resting Time: Adequate  Interpretation (Fetal Testing) Nonstress Test Interpretation: Reactive Overall Impression: Reassuring for gestational age Comments: Dr. Ileana reviewed tracing

## 2024-06-12 NOTE — Progress Notes (Signed)
 MFM Consult Note  Hannah Burns is currently at [redacted]w[redacted]d. She has been followed due to advanced maternal age, maternal obesity, and diet-controlled gestational diabetes.    She denies any problems since her last exam and reports that her fingerstick values have mostly been within normal limits.  She reports feeling fetal movements throughout the day.    Her blood pressure today was 133/71.  Sonographic findings Single intrauterine pregnancy at 38w 0d. Fetal cardiac activity: Observed. Presentation: Cephalic. Fetal biometry shows the estimated fetal weight of 5 lb 13 oz,  2640g (7%). Amniotic fluid: Oligohydramnios. AFI: 5.66 cm.  MVP: 2.35 cm. Placenta: Anterior. BPP: 8/10 with a reactive NST.  She received a -2 for absent fetal tone.  Doppler studies of the umbilical arteries performed today showed a normal S/D ratio of 2.04.  There were no signs of absent or reversed end-diastolic flow.  Due to IUGR with low normal amniotic fluid, delivery is recommended.  As there is a high census currently on L&D, the patient will be scheduled for induction tomorrow morning.  She was advised to wait for a phone call and to go in for induction once she receives a call from the hospital.  As her fetal testing was reassuring today, it is okay to wait until tomorrow for delivery.  The patient stated that all of her questions were answered.   A total of 20 minutes was spent counseling and coordinating the care for this patient.  Greater than 50% of the time was spent in direct face-to-face contact.

## 2024-06-13 ENCOUNTER — Other Ambulatory Visit: Payer: Self-pay

## 2024-06-13 ENCOUNTER — Inpatient Hospital Stay (HOSPITAL_COMMUNITY): Attending: Obstetrics and Gynecology

## 2024-06-13 ENCOUNTER — Inpatient Hospital Stay (HOSPITAL_COMMUNITY): Admitting: Anesthesiology

## 2024-06-13 ENCOUNTER — Encounter: Admitting: Obstetrics and Gynecology

## 2024-06-13 ENCOUNTER — Inpatient Hospital Stay (HOSPITAL_COMMUNITY)
Admission: RE | Admit: 2024-06-13 | Discharge: 2024-06-15 | DRG: 806 | Disposition: A | Discharge status: Home / Self Care | Attending: Obstetrics and Gynecology | Admitting: Obstetrics and Gynecology

## 2024-06-13 ENCOUNTER — Inpatient Hospital Stay (HOSPITAL_COMMUNITY): Admission: RE | Admit: 2024-06-13 | Source: Home / Self Care | Admitting: Obstetrics and Gynecology

## 2024-06-13 ENCOUNTER — Encounter (HOSPITAL_COMMUNITY): Payer: Self-pay | Admitting: Obstetrics and Gynecology

## 2024-06-13 DIAGNOSIS — O4103X0 Oligohydramnios, third trimester, not applicable or unspecified: Secondary | ICD-10-CM

## 2024-06-13 DIAGNOSIS — Z7982 Long term (current) use of aspirin: Secondary | ICD-10-CM

## 2024-06-13 DIAGNOSIS — O24410 Gestational diabetes mellitus in pregnancy, diet controlled: Secondary | ICD-10-CM

## 2024-06-13 DIAGNOSIS — F17210 Nicotine dependence, cigarettes, uncomplicated: Secondary | ICD-10-CM | POA: Diagnosis present

## 2024-06-13 DIAGNOSIS — O365930 Maternal care for other known or suspected poor fetal growth, third trimester, not applicable or unspecified: Principal | ICD-10-CM | POA: Diagnosis present

## 2024-06-13 DIAGNOSIS — O99334 Smoking (tobacco) complicating childbirth: Secondary | ICD-10-CM | POA: Diagnosis present

## 2024-06-13 DIAGNOSIS — Z8249 Family history of ischemic heart disease and other diseases of the circulatory system: Secondary | ICD-10-CM | POA: Diagnosis not present

## 2024-06-13 DIAGNOSIS — Z833 Family history of diabetes mellitus: Secondary | ICD-10-CM | POA: Diagnosis not present

## 2024-06-13 DIAGNOSIS — O24420 Gestational diabetes mellitus in childbirth, diet controlled: Secondary | ICD-10-CM | POA: Diagnosis present

## 2024-06-13 DIAGNOSIS — Z87828 Personal history of other (healed) physical injury and trauma: Secondary | ICD-10-CM | POA: Diagnosis not present

## 2024-06-13 DIAGNOSIS — O0990 Supervision of high risk pregnancy, unspecified, unspecified trimester: Principal | ICD-10-CM

## 2024-06-13 DIAGNOSIS — Z3A38 38 weeks gestation of pregnancy: Secondary | ICD-10-CM

## 2024-06-13 DIAGNOSIS — Z8781 Personal history of (healed) traumatic fracture: Secondary | ICD-10-CM

## 2024-06-13 DIAGNOSIS — O24419 Gestational diabetes mellitus in pregnancy, unspecified control: Secondary | ICD-10-CM | POA: Diagnosis present

## 2024-06-13 DIAGNOSIS — O09523 Supervision of elderly multigravida, third trimester: Secondary | ICD-10-CM | POA: Diagnosis not present

## 2024-06-13 DIAGNOSIS — O99214 Obesity complicating childbirth: Secondary | ICD-10-CM | POA: Diagnosis present

## 2024-06-13 DIAGNOSIS — O09529 Supervision of elderly multigravida, unspecified trimester: Secondary | ICD-10-CM

## 2024-06-13 HISTORY — DX: Oligohydramnios, third trimester, not applicable or unspecified: O41.03X0

## 2024-06-13 LAB — CBC
HCT: 36.1 % (ref 36.0–46.0)
Hemoglobin: 12.6 g/dL (ref 12.0–15.0)
MCH: 31.8 pg (ref 26.0–34.0)
MCHC: 34.9 g/dL (ref 30.0–36.0)
MCV: 91.2 fL (ref 80.0–100.0)
Platelets: 185 K/uL (ref 150–400)
RBC: 3.96 MIL/uL (ref 3.87–5.11)
RDW: 13.5 % (ref 11.5–15.5)
WBC: 9.5 K/uL (ref 4.0–10.5)
nRBC: 0 % (ref 0.0–0.2)

## 2024-06-13 LAB — COMPREHENSIVE METABOLIC PANEL WITH GFR
ALT: 17 U/L (ref 0–44)
AST: 38 U/L (ref 15–41)
Albumin: 3.7 g/dL (ref 3.5–5.0)
Alkaline Phosphatase: 197 U/L — ABNORMAL HIGH (ref 38–126)
Anion gap: 12 (ref 5–15)
BUN: 7 mg/dL (ref 6–20)
CO2: 19 mmol/L — ABNORMAL LOW (ref 22–32)
Calcium: 9.4 mg/dL (ref 8.9–10.3)
Chloride: 105 mmol/L (ref 98–111)
Creatinine, Ser: 0.64 mg/dL (ref 0.44–1.00)
GFR, Estimated: >60 mL/min
Glucose, Bld: 255 mg/dL — ABNORMAL HIGH (ref 70–99)
Potassium: 3.8 mmol/L (ref 3.5–5.1)
Sodium: 137 mmol/L (ref 135–145)
Total Bilirubin: 0.4 mg/dL (ref 0.0–1.2)
Total Protein: 7.6 g/dL (ref 6.5–8.1)

## 2024-06-13 LAB — GLUCOSE, CAPILLARY
Glucose-Capillary: 125 mg/dL — ABNORMAL HIGH (ref 70–99)
Glucose-Capillary: 82 mg/dL (ref 70–99)
Glucose-Capillary: 73 mg/dL (ref 70–99)

## 2024-06-13 LAB — TYPE AND SCREEN
ABO/RH(D): B POS
Antibody Screen: NEGATIVE

## 2024-06-13 LAB — SYPHILIS: RPR W/REFLEX TO RPR TITER AND TREPONEMAL ANTIBODIES, TRADITIONAL SCREENING AND DIAGNOSIS ALGORITHM: RPR Ser Ql: NONREACTIVE

## 2024-06-13 MED ORDER — EPHEDRINE 5 MG/ML INJ: 10.0000 mg | INTRAVENOUS | Status: DC | PRN

## 2024-06-13 MED ORDER — LIDOCAINE HCL (PF) 1 % IJ SOLN: 30.0000 mL | INTRAMUSCULAR | Status: DC | PRN

## 2024-06-13 MED ORDER — OXYTOCIN-SODIUM CHLORIDE 30-0.9 UT/500ML-% IV SOLN
1.0000 m[IU]/min | INTRAVENOUS | Status: DC
  Administered 2024-06-13: 2 m[IU]/min via INTRAVENOUS
  Filled 2024-06-13: qty 500

## 2024-06-13 MED ORDER — SOD CITRATE-CITRIC ACID 500-334 MG/5ML PO SOLN: 30.0000 mL | ORAL | Status: DC | PRN

## 2024-06-13 MED ORDER — ONDANSETRON HCL 4 MG/2ML IJ SOLN: 4.0000 mg | Freq: Four times a day (QID) | INTRAMUSCULAR | Status: DC | PRN

## 2024-06-13 MED ORDER — ACETAMINOPHEN 325 MG PO TABS
650.0000 mg | ORAL_TABLET | ORAL | Status: DC | PRN
  Administered 2024-06-14 (×2): 650 mg via ORAL
  Filled 2024-06-13 (×2): qty 2

## 2024-06-13 MED ORDER — DIPHENHYDRAMINE HCL 25 MG PO CAPS: 25.0000 mg | ORAL_CAPSULE | Freq: Four times a day (QID) | ORAL | Status: DC | PRN

## 2024-06-13 MED ORDER — OXYTOCIN-SODIUM CHLORIDE 30-0.9 UT/500ML-% IV SOLN
2.5000 [IU]/h | INTRAVENOUS | Status: DC
  Administered 2024-06-13: 2.5 [IU]/h via INTRAVENOUS

## 2024-06-13 MED ORDER — OXYCODONE-ACETAMINOPHEN 5-325 MG PO TABS: 1.0000 | ORAL_TABLET | ORAL | Status: DC | PRN

## 2024-06-13 MED ORDER — PRENATAL MULTIVITAMIN CH
1.0000 | ORAL_TABLET | Freq: Every day | ORAL | Status: DC
  Administered 2024-06-14: 1 via ORAL
  Filled 2024-06-13: qty 1

## 2024-06-13 MED ORDER — TERBUTALINE SULFATE 1 MG/ML IJ SOLN: 0.2500 mg | Freq: Once | INTRAMUSCULAR | Status: DC | PRN

## 2024-06-13 MED ORDER — IBUPROFEN 600 MG PO TABS
600.0000 mg | ORAL_TABLET | Freq: Four times a day (QID) | ORAL | Status: DC
  Administered 2024-06-13 – 2024-06-15 (×6): 600 mg via ORAL
  Filled 2024-06-13 (×6): qty 1

## 2024-06-13 MED ORDER — TETANUS-DIPHTH-ACELL PERTUSSIS 5-2-15.5 LF-MCG/0.5 IM SUSP: 0.5000 mL | Freq: Once | INTRAMUSCULAR | Status: DC

## 2024-06-13 MED ORDER — COCONUT OIL OIL: 1.0000 | TOPICAL_OIL | Status: DC | PRN

## 2024-06-13 MED ORDER — LACTATED RINGERS AMNIOINFUSION: INTRAVENOUS | Status: DC

## 2024-06-13 MED ORDER — ZOLPIDEM TARTRATE 5 MG PO TABS: 5.0000 mg | ORAL_TABLET | Freq: Every evening | ORAL | Status: DC | PRN

## 2024-06-13 MED ORDER — ONDANSETRON HCL 4 MG PO TABS: 4.0000 mg | ORAL_TABLET | ORAL | Status: DC | PRN

## 2024-06-13 MED ORDER — LACTATED RINGERS IV SOLN
500.0000 mL | Freq: Once | INTRAVENOUS | Status: AC
  Administered 2024-06-13: 500 mL via INTRAVENOUS

## 2024-06-13 MED ORDER — FENTANYL CITRATE (PF) 100 MCG/2ML IJ SOLN: 50.0000 ug | INTRAMUSCULAR | Status: DC | PRN

## 2024-06-13 MED ORDER — ONDANSETRON HCL 4 MG/2ML IJ SOLN: 4.0000 mg | INTRAMUSCULAR | Status: DC | PRN

## 2024-06-13 MED ORDER — MISOPROSTOL 50MCG HALF TABLET
50.0000 ug | ORAL_TABLET | Freq: Once | ORAL | Status: DC
  Filled 2024-06-13: qty 1

## 2024-06-13 MED ORDER — WITCH HAZEL-GLYCERIN EX PADS: 1.0000 | MEDICATED_PAD | CUTANEOUS | Status: DC | PRN

## 2024-06-13 MED ORDER — PHENYLEPHRINE 80 MCG/ML (10ML) SYRINGE FOR IV PUSH (FOR BLOOD PRESSURE SUPPORT): 80.0000 ug | PREFILLED_SYRINGE | INTRAVENOUS | Status: DC | PRN

## 2024-06-13 MED ORDER — FENTANYL-BUPIVACAINE-NACL 0.5-0.125-0.9 MG/250ML-% EP SOLN
12.0000 mL/h | EPIDURAL | Status: DC | PRN
  Administered 2024-06-13: 12 mL/h via EPIDURAL
  Filled 2024-06-13: qty 250

## 2024-06-13 MED ORDER — LIDOCAINE HCL (PF) 1 % IJ SOLN
INTRAMUSCULAR | Status: DC | PRN
  Administered 2024-06-13: 8 mL via EPIDURAL

## 2024-06-13 MED ORDER — LACTATED RINGERS IV SOLN: INTRAVENOUS | Status: DC

## 2024-06-13 MED ORDER — SENNOSIDES-DOCUSATE SODIUM 8.6-50 MG PO TABS
2.0000 | ORAL_TABLET | ORAL | Status: DC
  Administered 2024-06-13 – 2024-06-14 (×2): 2 via ORAL
  Filled 2024-06-13 (×2): qty 2

## 2024-06-13 MED ORDER — LACTATED RINGERS IV SOLN: 500.0000 mL | INTRAVENOUS | Status: DC | PRN

## 2024-06-13 MED ORDER — SIMETHICONE 80 MG PO CHEW: 80.0000 mg | CHEWABLE_TABLET | ORAL | Status: DC | PRN

## 2024-06-13 MED ORDER — DIPHENHYDRAMINE HCL 50 MG/ML IJ SOLN: 12.5000 mg | INTRAMUSCULAR | Status: DC | PRN

## 2024-06-13 MED ORDER — OXYTOCIN BOLUS FROM INFUSION
333.0000 mL | Freq: Once | INTRAVENOUS | Status: AC
  Administered 2024-06-13: 333 mL via INTRAVENOUS

## 2024-06-13 MED ORDER — MISOPROSTOL 25 MCG QUARTER TABLET
25.0000 ug | ORAL_TABLET | Freq: Once | ORAL | Status: DC
  Filled 2024-06-13: qty 1

## 2024-06-13 MED ORDER — BENZOCAINE-MENTHOL 20-0.5 % EX AERO: 1.0000 | INHALATION_SPRAY | CUTANEOUS | Status: DC | PRN

## 2024-06-13 MED ORDER — ACETAMINOPHEN 325 MG PO TABS: 650.0000 mg | ORAL_TABLET | ORAL | Status: DC | PRN

## 2024-06-13 MED ORDER — DIBUCAINE (PERIANAL) 1 % EX OINT: 1.0000 | TOPICAL_OINTMENT | CUTANEOUS | Status: DC | PRN

## 2024-06-13 NOTE — Discharge Summary (Signed)
 "    Postpartum Discharge Summary     Patient Name: Hannah Burns DOB: 05-20-1991 MRN: [id number]  Date of admission: 06/13/2024 Delivery date:06/13/2024 Delivering provider: MILLY PLANAS A Date of discharge: 06/15/2024  Admitting diagnosis: Indication for care or intervention in labor or delivery [O75.9] Intrauterine pregnancy: [redacted]w[redacted]d     Secondary diagnosis:  Principal Problem:   Indication for care or intervention in labor or delivery Active Problems:   AMA (advanced maternal age) multigravida 35+   History of pelvic fracture   Gestational diabetes   Oligohydramnios in singleton pregnancy in third trimester   Vaginal delivery  Additional problems: N/A    Discharge diagnosis: Term Pregnancy Delivered                                              Post partum procedures:N/A Augmentation: AROM and Pitocin  Complications: None  Hospital course: Induction of Labor With Vaginal Delivery   34 y.o. yo [id number] at [redacted]w[redacted]d was admitted to the hospital 06/13/2024 for induction of labor.  Indication for induction: FGR.  Patient had an labor course also complicated by GDM-A1 and oligo.  Membrane Rupture Time/Date: 1:17 PM,06/13/2024  Delivery Method:Vaginal, Spontaneous Operative Delivery:N/A Episiotomy: None Lacerations:  Labial Details of delivery can be found in separate delivery note.  Patient had a postpartum course complicated by N/A. Patient is discharged home 06/15/2024.  Newborn Data: Birth date:06/13/2024 Birth time:7:45 PM Gender:Female Living status:Living Apgars:9 ,9  Weight:2980 g  Magnesium  Sulfate received: No BMZ received: No Rhophylac:N/A MMR:N/A T-DaP:Given prenatally Flu: Given prenatally RSV Vaccine received: No Transfusion:No  Immunizations received: Immunization History  Administered Date(s) Administered   Influenza, Seasonal, Injecte, Preservative Fre 04/18/2024   Influenza,inj,Quad PF,6+ Mos 04/12/2013   Tdap 07/05/2013, 01/24/2023, 04/04/2024     Physical exam  Vitals:   06/14/24 1700 06/14/24 1945 06/14/24 2051 06/15/24 0524  BP: 111/65 136/73 117/64 109/60  Pulse: 72 71 75 66  Resp: 19 18  18   Temp: 98.7 F (37.1 C) 98.6 F (37 C)  98 F (36.7 C)  TempSrc:  Oral  Oral  SpO2:  99%  100%  Weight:      Height:       General: alert, cooperative, and no distress Lochia: appropriate Uterine Fundus: firm Incision: N/A DVT Evaluation: No evidence of DVT seen on physical exam. Labs: Lab Results  Component Value Date   WBC 12.8 (H) 06/14/2024   HGB 11.3 (L) 06/14/2024   HCT 32.8 (L) 06/14/2024   MCV 92.4 06/14/2024   PLT 150 06/14/2024      Latest Ref Rng & Units 06/14/2024    2:19 AM  CMP  Glucose 70 - 99 mg/dL 821    Edinburgh Score:    06/14/2024   11:11 AM  Edinburgh Postnatal Depression Scale Screening Tool  I have been able to laugh and see the funny side of things. 0  I have looked forward with enjoyment to things. 0  I have blamed myself unnecessarily when things went wrong. 1  I have been anxious or worried for no good reason. 0  I have felt scared or panicky for no good reason. 1  Things have been getting on top of me. 2  I have been so unhappy that I have had difficulty sleeping. 1  I have felt sad or miserable. 0  I have been so unhappy that I  have been crying. 0  The thought of harming myself has occurred to me. 0  Edinburgh Postnatal Depression Scale Total 5   Edinburgh Postnatal Depression Scale Total: 5   After visit meds:  Allergies as of 06/15/2024   No Known Allergies      Medication List     STOP taking these medications    Accu-Chek Guide Test test strip Generic drug: glucose blood   Accu-Chek Guide w/Device Kit   Accu-Chek Softclix Lancets lancets   aspirin  81 MG chewable tablet   Blood Pressure Kit Devi       TAKE these medications    acetaminophen  325 MG tablet Commonly known as: Tylenol  Take 2 tablets (650 mg total) by mouth every 4 (four) hours as needed  (for pain scale < 4). What changed:  medication strength how much to take when to take this reasons to take this additional instructions   ibuprofen  600 MG tablet Commonly known as: ADVIL  Take 1 tablet (600 mg total) by mouth every 6 (six) hours.   multivitamin-prenatal 27-0.8 MG Tabs tablet Take 1 tablet by mouth daily.   senna-docusate 8.6-50 MG tablet Commonly known as: Senokot-S Take 2 tablets by mouth daily.         Discharge home in stable condition Infant Feeding: Breast Infant Disposition:home with mother Discharge instruction: per After Visit Summary and Postpartum booklet. Activity: Advance as tolerated. Pelvic rest for 6 weeks.  Diet: routine diet Future Appointments:No future appointments. Follow up Visit: Message sent 1/7/206   Please schedule this patient for a In person postpartum visit in 4-6 weeks with the following provider: Any provider. Additional Postpartum F/U:2 hour GTT  High risk pregnancy complicated by: FGR, GDM, AMA, H/O Pelvic Fracture Delivery mode:  Vaginal, Spontaneous Anticipated Birth Control:  Nexplanon   06/15/2024 Mariyah Upshaw LITTIE Angles, MD    "

## 2024-06-13 NOTE — Progress Notes (Signed)
 LABOR PROGRESS NOTE  Hannah Burns is a 34 y.o. G3P1011 at [redacted]w[redacted]d  admitted for IOL for IUGR 7th percentile and oligohydramnios  Subjective: Doing well with epidural in place  Objective: BP 121/73   Pulse 73   Temp 97.7 F (36.5 C) (Oral)   Resp 16   Ht 5' 2 (1.575 m)   Wt 96.3 kg   LMP 08/21/2023   SpO2 99%   BMI 38.83 kg/m  or  Vitals:   06/13/24 1316 06/13/24 1321 06/13/24 1326 06/13/24 1327  BP: 115/68 123/69  121/73  Pulse: 80 72  73  Resp:      Temp:      TempSrc:      SpO2: 98% 99% 99%   Weight:      Height:         Dilation: 3 Effacement (%): 70 Cervical Position: Middle Station: -2 Presentation: Vertex Exam by:: Dr. Ilean FHT: baseline rate 120, moderate varibility, + acel, no decel Toco: Infrequent  Labs: Lab Results  Component Value Date   WBC 9.5 06/13/2024   HGB 12.6 06/13/2024   HCT 36.1 06/13/2024   MCV 91.2 06/13/2024   PLT 185 06/13/2024    Patient Active Problem List   Diagnosis Date Noted   Indication for care or intervention in labor or delivery 06/13/2024   Oligohydramnios in singleton pregnancy in third trimester 06/13/2024   Gestational diabetes 04/04/2024   History of pelvic fracture 12/13/2023   Supervision of high risk pregnancy, antepartum 12/08/2023   AMA (advanced maternal age) multigravida 35+ 12/08/2023    Assessment / Plan: 34 y.o. G3P1011 at [redacted]w[redacted]d here for IOL for IUGR and oligohydramnios  Labor: Slow progression.  Discussed AROM and patient agreeable.  Discussed the risks and benefits.  AROM with scant amount of clear fluid. Fetal Wellbeing: Category workup reassuring Pain Control: Epidural in place Anticipated MOD: Vaginal delivery  Steffan Ilean, MD Attending Family Medicine Physician, [facility] for [facility], [facility]  06/13/2024 2:51 PM

## 2024-06-13 NOTE — H&P (Signed)
 OBSTETRIC ADMISSION HISTORY AND PHYSICAL  Hannah Burns is 34 y.o. [phone] with IUP at [redacted]w[redacted]d 06/26/2024, by Ultrasound presenting for IOL due to IUGR (7%) and oligohydramnios. Also being monitored for GDM, diet controlled and history of a pelvic fracture. Ok to attempt vaginal delivery per ortho.She received her prenatal care at [facility] at [redacted]w[redacted]d: normal anatomy, cephalic presentation, anterior placenta, EFW 2640g, (7%).   ROS (+) FM (-) ctx, VB, LOF. HA, RUQ pain, CP, SOB   Prenatal History/Complications NURSING  PROVIDER  Cons[facility]ator, [facility] Dating by LMP  [facility] Model Traditional Anatomy U/S  WNL, EICF declines amnio  Initiated care at  [facility]  English               LAB RESULTS   Support Person Mom, Sister, FOB Genetics NIPS: low risk female AFP: normal      NT/IT (FT only)        Carrier Screen Horizon: negative  Rhogam  B/Positive/-- (07/07 1458) A1C/GTT Early HgbA1C: 5.0 Third trimester 2 hr GTT: 94/93/72   Flu Vaccine 04/18/2024      TDaP Vaccine  04/04/24 Blood Type B/Positive/-- (07/07 1458)  RSV Vaccine   Antibody Negative (07/07 1458)  COVID Vaccine   Rubella 13.70 (07/07 1458)  Feeding Plan both RPR Non Reactive (07/07 1458)  Contraception IUD vs Nexplanon, still considering HBsAg Negative (07/07 1458)  Circumcision If boy, Yes  HIV Non Reactive (07/07 1458)  P[facility]ian  [facility]  HCVAb Non Reactive (07/07 1458)  Prenatal Classes        BTL Consent   Pap PP (declined during)  BTL Pre-payment   GC/CT Initial:  neg/neg 36wks: Negative    VBAC Consent   GBS   For PCN allergy, check sensitivities   BRx Optimized? [ ]  yes   [ ]  no      DME Rx [ ]  BP cuff [ ]  Weight Scale Waterbirth  [ ]  Class [ ]  Consent [ ]  CNM visit  PHQ9 & GAD7 [  ] new OB [  ] 28 weeks  [  ] 36 weeks Induction  [ ]  Orders Entered [ ] Foley Y/N   OB History  Gravida Para Term Preterm AB Living  3 1 1  1 1   SAB IAB Ectopic Multiple Live Births  1     1    # Outcome Date GA Lbr Len/2nd Weight Sex Type Anes PTL Lv  3 Current           2 SAB 2023          1 Term 09/14/13 [redacted]w[redacted]d 08:01 / 00:16 2866 g F Vag-Spont EPI  LIV   Patient Active Problem List   Diagnosis Date Noted   Indication for care or intervention in labor or delivery 06/13/2024   Gestational diabetes 04/04/2024   History of pelvic fracture 12/13/2023   Supervision of high risk pregnancy, antepartum 12/08/2023   AMA (advanced maternal age) multigravida 35+ 12/08/2023    Past Medical History: Past Medical History:  Diagnosis Date   Allergy    GDM (gestational diabetes mellitus) 03/2024    Past Surgical History: Past Surgical History:  Procedure Laterality Date   HARDWARE REMOVAL Left 07/12/2023   Procedure: HARDWARE REMOVAL;  Surgeon: Celena Sharper, MD;  Location: [facility] OR;  Service: Orthopedics;  Laterality: Left;   PERCUTANEOUS PINNING Left 01/25/2023  Procedure: Sacroiliac Screw Fixation;  Surgeon: Celena Sharper, MD;  Location: [facility] OR;  Service: Orthopedics;  Laterality: Left;    Social History Social History   Socioeconomic History   Marital status: Single    Spouse name: Not on file   Number of children: 1   Years of education: 12th grade   Highest education level: High school graduate  Occupational History   Not on file  Tobacco Use   Smoking status: Some Days    Current packs/day: 1.00    Average packs/day: 1 pack/day for 10.0 years (10.0 ttl pk-yrs)    Types: Cigarettes    Start date: 2016    Last attempt to quit: 07/19/2013   Smokeless tobacco: Never  Vaping Use   Vaping status: Never Used  Substance and Sexual Activity   Alcohol  use: Not Currently    Comment: occassionlly   Drug use: Not Currently    Frequency: 1.0 times per week    Types: Marijuana    Comment: LAST TIME SMOKED - JUNE   Sexual activity: Yes    Birth control/protection: None  Other Topics Concern   Not on file  Social History Narrative   Not on file   Social Drivers  of Health   Tobacco Use: High Risk (06/13/2024)   Patient History    Smoking Tobacco Use: Some Days    Smokeless Tobacco Use: Never    Passive Exposure: Not on file  Financial Resource Strain: Not on file  Food Insecurity: No Food Insecurity (01/28/2023)   Hunger Vital Sign    Worried About Running Out of Food in the Last Year: Never true    Ran Out of Food in the Last Year: Never true  Transportation Needs: No Transportation Needs (01/28/2023)   PRAPARE - Administrator, Civil Service (Medical): No    Lack of Transportation (Non-Medical): No  Physical Activity: Not on file  Stress: Not on file  Social Connections: Not on file  Depression (PHQ2-9): Medium Risk (12/13/2023)   Depression (PHQ2-9)    PHQ-2 Score: 6  Alcohol  Screen: Not on file  Housing: Low Risk (01/28/2023)   Housing    Last Housing Risk Score: 0  Utilities: Not At Risk (01/28/2023)   AHC Utilities    Threatened with loss of utilities: No  Health Literacy: Not on file    Family History: Family History  Problem Relation Age of Onset   Asthma Daughter    Hypertension Maternal Grandmother    Diabetes Maternal Grandmother    Cancer Neg Hx    Heart disease Neg Hx     Allergies: Allergies[1]  Medications Prior to Admission  Medication Sig Dispense Refill Last Dose/Taking   aspirin  81 MG chewable tablet Chew 1 tablet (81 mg total) by mouth daily. 90 tablet 3 Past Week   Prenatal Vit-Fe Fumarate-FA (MULTIVITAMIN-PRENATAL) 27-0.8 MG TABS tablet Take 1 tablet by mouth daily. 100 tablet 1 Past Week   Accu-Chek Softclix Lancets lancets Use as instructed four times per day 100 each 12    acetaminophen  (TYLENOL ) 500 MG tablet Take 1-2 tablets (500-1,000 mg total) by mouth every 6 (six) hours as needed. Do not exceed 4,000 mg in any 24 hour period.      Blood Glucose Monitoring Suppl (ACCU-CHEK GUIDE) w/Device KIT 1 Device by Does not apply route as needed. 1 kit 0    Blood Glucose Monitoring Suppl (ACCU-CHEK  GUIDE) w/Device KIT Use as directed as needed. 1 kit 0  Blood Pressure Monitoring (BLOOD PRESSURE KIT) DEVI 1 Device by Does not apply route as needed. 1 each 0    glucose blood (ACCU-CHEK GUIDE TEST) test strip Use as instructed four times per day 100 each 12      Review of Systems  All systems reviewed and negative except as stated in HPI  PHYSICAL EXAM Blood pressure 124/75, pulse 77, temperature 97.7 F (36.5 C), temperature source Oral, resp. rate 16, height 5' 2 (1.575 m), weight 96.3 kg, last menstrual period 08/21/2023. General appearance: alert and cooperative Lungs: respirations nonlabored Heart: regular rate Abdomen: gravid  Fetal monitoring 0900: Baseline: 130 bpm, Variability: Good {> 6 bpm), Accelerations: Reactive, and Decelerations: Absent Uterine activityflat  Dilation: 2.5 Effacement (%): 70 Station: -2 Exam by:: Nicholaus, RN Presentation: cephalic 06/13/2024 0730   Prenatal labs: ABO, Rh: B/Positive/-- (07/07 1458) Antibody: Negative (07/07 1458) Rubella: 13.70 (07/07 1458) RPR: Non Reactive (10/14 0902)  HBsAg: Negative (07/07 1458)  HIV: Non Reactive (10/14 0902)   Lab Results  Component Value Date   GBS Negative 05/29/2024    Immunization History  Administered Date(s) Administered   Influenza, Seasonal, Injecte, Preservative Fre 04/18/2024   Influenza,inj,Quad PF,6+ Mos 04/12/2013   Tdap 07/05/2013, 01/24/2023, 04/04/2024    Prenatal Transfer Tool  Maternal Diabetes: Yes:  Diabetes Type:  Diet controlled Genetic Screening: Normal Maternal Ultrasounds/Referrals: IUGR and Isolated EIF (echogenic intracardiac focus) Fetal Ultrasounds or other Referrals:  None Maternal Substance Abuse:  No Significant Maternal Medications:  None Significant Maternal Lab Results: Group B Strep negative Number of Prenatal Visits:greater than 3 verified prenatal visits Maternal Vaccinations:TDap and Flu Other Comments:  None   No results found for this or any  previous visit (from the past 24 hours).  Patient Active Problem List   Diagnosis Date Noted   Indication for care or intervention in labor or delivery 06/13/2024   Gestational diabetes 04/04/2024   History of pelvic fracture 12/13/2023   Supervision of high risk pregnancy, antepartum 12/08/2023   AMA (advanced maternal age) multigravida 35+ 12/08/2023    ASSESSMENT & PLAN Hannah Burns is 34 y.o. [phone] with IUP at 100w1d 06/26/2024, by Ultrasound admitted for IOL due to IUGR (7%) with oligohydramnios. Also being monitored for GDM, diet controlled and history of a pelvic fracture. Ok to attempt vaginal delivery per ortho.  [facility] at [redacted]w[redacted]d: normal anatomy, cephalic presentation, anterior placenta, EFW 2640g, (7%)  #Labor: IOL, pitocin   #Pain: Per patient preference, encourage ambulation #FWB: Cat I  #oligohydramnios - new at 38+0  #gDMa1 - POC glucose q4h latent labor, q2h active - EFW as above, FGR  #h/o pelvic fx SI pinning by Dr. Ozell Bruch 01/2023, hardware removed 07/2023. - ok for vaginal delivery attempt  #GBS status:  negative #Feeding: Breastmilk  and Formula #Reproductive Life planning: IUD vs Nexplanon #Circ:  yes   Barabara Maier, DO FM-OB Fellow Center for [facility]      [1] No Known Allergies

## 2024-06-13 NOTE — Inpatient Diabetes Management (Signed)
 Inpatient Diabetes Program Recommendations  AACE/ADA: New Consensus Statement on Inpatient Glycemic Control (2015)  Target Ranges:  Prepandial:   less than 140 mg/dL      Peak postprandial:   less than 180 mg/dL (1-2 hours)      Critically ill patients:  140 - 180 mg/dL   Lab Results  Component Value Date   GLUCAP 125 (H) 06/13/2024   HGBA1C 5.0 12/13/2023    Review of Glycemic Control  Latest Reference Range & Units 06/13/24 08:36  Glucose-Capillary 70 - 99 mg/dL 874 (H)   Diabetes history: GDM Outpatient Diabetes medications:  None Current orders for Inpatient glycemic control:  CBG's q 2 hours-  Inpatient Diabetes Program Recommendations:    First CBG check >120 mg/dL.  May consider Novolog 0-14 units q 4 hours.  If 2 CBG's>120 mg/dL, consider IV insulin for labor.    Thanks,  Randall Bullocks, RN, BC-ADM Inpatient Diabetes Coordinator Pager [phone]  (8a-5p)

## 2024-06-13 NOTE — Anesthesia Preprocedure Evaluation (Signed)
"                                    Anesthesia Evaluation  Patient identified by MRN, date of birth, ID band Patient awake    Reviewed: Allergy & Precautions, Patient's Chart, lab work & pertinent test results  Airway Mallampati: III  TM Distance: >3 FB Neck ROM: Full    Dental no notable dental hx.    Pulmonary Current Smoker and Patient abstained from smoking.   Pulmonary exam normal breath sounds clear to auscultation       Cardiovascular negative cardio ROS Normal cardiovascular exam Rhythm:Regular Rate:Normal     Neuro/Psych negative neurological ROS  negative psych ROS   GI/Hepatic negative GI ROS, Neg liver ROS,,,  Endo/Other  diabetes, Well Controlled, Gestational  BMI 39  Renal/GU negative Renal ROS  negative genitourinary   Musculoskeletal Hx pelvic fx s/p surgery 2023 and subsequent removal of hardware 2024   Abdominal  (+) + obese  Peds negative pediatric ROS (+)  Hematology Hb 12.6, plt 185   Anesthesia Other Findings   Reproductive/Obstetrics (+) Pregnancy IUGR, oligo                              Anesthesia Physical Anesthesia Plan  ASA: 3  Anesthesia Plan: Epidural   Post-op Pain Management:    Induction:   PONV Risk Score and Plan: 2  Airway Management Planned: Natural Airway  Additional Equipment: None  Intra-op Plan:   Post-operative Plan:   Informed Consent: I have reviewed the patients History and Physical, chart, labs and discussed the procedure including the risks, benefits and alternatives for the proposed anesthesia with the patient or authorized representative who has indicated his/her understanding and acceptance.       Plan Discussed with:   Anesthesia Plan Comments:         Anesthesia Quick Evaluation  "

## 2024-06-13 NOTE — Anesthesia Procedure Notes (Signed)
 Epidural Patient location during procedure: OB Start time: 06/13/2024 12:57 PM End time: 06/13/2024 1:02 PM  Staffing Anesthesiologist: Merla Almarie HERO, DO Performed: anesthesiologist   Preanesthetic Checklist Completed: patient identified, IV checked, risks and benefits discussed, monitors and equipment checked, pre-op evaluation and timeout performed  Epidural Patient position: sitting Prep: DuraPrep and site prepped and draped Patient monitoring: continuous pulse ox, blood pressure, heart rate and cardiac monitor Approach: midline Location: L3-L4 Injection technique: LOR air  Needle:  Needle type: Tuohy  Needle gauge: 17 G Needle length: 9 cm Needle insertion depth: 7 cm Catheter type: closed end flexible Catheter size: 19 Gauge Catheter at skin depth: 12 cm Test dose: negative  Assessment Sensory level: T8 Events: blood not aspirated, no cerebrospinal fluid, injection not painful, no injection resistance, no paresthesia and negative IV test  Additional Notes Patient identified. Risks/Benefits/Options discussed with patient including but not limited to bleeding, infection, nerve damage, paralysis, failed block, incomplete pain control, headache, blood pressure changes, nausea, vomiting, reactions to medication both or allergic, itching and postpartum back pain. Confirmed with bedside nurse the patient's most recent platelet count. Confirmed with patient that they are not currently taking any anticoagulation, have any bleeding history or any family history of bleeding disorders. Patient expressed understanding and wished to proceed. All questions were answered. Sterile technique was used throughout the entire procedure. Please see nursing notes for vital signs. Test dose was given through epidural catheter and negative prior to continuing to dose epidural or start infusion. Warning signs of high block given to the patient including shortness of breath, tingling/numbness in  hands, complete motor block, or any concerning symptoms with instructions to call for help. Patient was given instructions on fall risk and not to get out of bed. All questions and concerns addressed with instructions to call with any issues or inadequate analgesia.  Reason for block:procedure for pain

## 2024-06-14 LAB — CBC
HCT: 32.8 % — ABNORMAL LOW (ref 36.0–46.0)
Hemoglobin: 11.3 g/dL — ABNORMAL LOW (ref 12.0–15.0)
MCH: 31.8 pg (ref 26.0–34.0)
MCHC: 34.5 g/dL (ref 30.0–36.0)
MCV: 92.4 fL (ref 80.0–100.0)
Platelets: 150 K/uL (ref 150–400)
RBC: 3.55 MIL/uL — ABNORMAL LOW (ref 3.87–5.11)
RDW: 13.2 % (ref 11.5–15.5)
WBC: 12.8 K/uL — ABNORMAL HIGH (ref 4.0–10.5)
nRBC: 0 % (ref 0.0–0.2)

## 2024-06-14 LAB — GLUCOSE, RANDOM: Glucose, Bld: 178 mg/dL — ABNORMAL HIGH (ref 70–99)

## 2024-06-14 NOTE — Lactation Note (Signed)
 This note was copied from a baby's chart. Lactation Consultation Note  Patient Name: Boy Zunaira Esbenshade Unijb'd Date: 06/14/2024 Age:35 hours Reason for consult: Follow-up assessment;Early term 37-38.6wks;Maternal endocrine disorder  P2- MOB informed LC that she plans to switch to formula only. LC reviewed lactation suppression with MOB. LC also reviewed engorgement/breast care. MOB thanked [facility] and denied having any questions or concerns at this time. LC encouraged MOB to call for further assistance as needed.  Maternal Data Has patient been taught Hand Expression?: No Does the patient have breastfeeding experience prior to this delivery?: No  Feeding Mother's Current Feeding Choice: Formula Nipple Type: Slow - flow  Interventions Interventions: Education;LC Services brochure  Consult Status Consult Status: Complete Date: 06/14/24    Recardo Hoit BS, IBCLC 06/14/2024, 4:41 PM

## 2024-06-14 NOTE — Anesthesia Postprocedure Evaluation (Signed)
"   Anesthesia Post Note  Patient: Carol It Sales Professional  Procedure(s) Performed: AN AD HOC LABOR EPIDURAL     Patient location during evaluation: Mother Baby Anesthesia Type: Epidural Level of consciousness: awake and alert and oriented Pain management: satisfactory to patient Vital Signs Assessment: post-procedure vital signs reviewed and stable Respiratory status: respiratory function stable Cardiovascular status: stable Postop Assessment: no headache, no backache, epidural receding, patient able to bend at knees, no signs of nausea or vomiting, adequate PO intake and able to ambulate Anesthetic complications: no   No notable events documented.  Last Vitals:  Vitals:   06/13/24 2338 06/14/24 0333  BP: 113/60 112/60  Pulse: 66 67  Resp: 18 18  Temp: 36.7 C 36.9 C  SpO2: 100% 100%    Last Pain:  Vitals:   06/14/24 0737  TempSrc:   PainSc: 7    Pain Goal:                   Dema Timmons      "

## 2024-06-14 NOTE — Lactation Note (Signed)
 This note was copied from a baby's chart. Lactation Consultation Note  Patient Name: Hannah Burns Today's Date: 06/14/2024 Age:36 hours Reason for consult: Initial assessment;Early term 37-38.6wks;Maternal endocrine disorder;1st time breastfeeding  P 2. Mom awake and lights on. Mom was sitting up but apparently turning over in the bed. Mom wanting to go back to sleep. Mom didn't appear to happy to see [facility] because she was tired. Suggested mom call for [facility] today when more rested and wanting to latch. Mom is GDM and is BF/formula feeding. Mom stated she is having trouble getting baby to take the formula. Hasn't been latching. Congratulated mom on her baby and encouraged to call for [facility] when ready to latch.   Maternal Data Has patient been taught Hand Expression?: No Does the patient have breastfeeding experience prior to this delivery?: No  Feeding    LATCH Score                    Lactation Tools Discussed/Used    Interventions Interventions: Breast feeding basics reviewed;LC Services brochure  Discharge    Consult Status Consult Status: Follow-up Date: 06/14/24 Follow-up type: In-patient    [name] G 06/14/2024, 4:57 AM

## 2024-06-14 NOTE — Progress Notes (Signed)
 POSTPARTUM PROGRESS NOTE Postpartum Day 1  Subjective: Hannah Burns is a 34 y.o. [id number] s/p NVD at [redacted]w[redacted]d.  She reports she is doing well. No acute events overnight. She denies any problems with ambulating, voiding or po intake. Denies nausea or vomiting.  Pain is moderately controlled.  Lochia is appropriate.  Objective: Blood pressure 112/60, pulse 67, temperature 98.4 F (36.9 C), temperature source Oral, resp. rate 18, height 5' 2 (1.575 m), weight 96.3 kg, last menstrual period 08/21/2023, SpO2 100%, unknown if currently breastfeeding.  Physical Exam:  General: alert, cooperative and no distress Heart:regular rate Resp: nonlabored Uterine Fundus: firm, appropriately tender Extremities:  none edema Skin: warm, dry  Recent Labs    06/13/24 0649 06/14/24 0219  HGB 12.6 11.3*  HCT 36.1 32.8*    Assessment/Plan: Hannah Burns is a 34 y.o. [id number] s/p NVDat [redacted]w[redacted]d   PPD#1 - meeting milestones - continue routine postpartum care  Feeding: combo Contraception: Nexplanon OP per pt preference   Dispo: Plan for discharge PPD2.  LOS: 1 day   Barabara Maier, DO [facility] [employer]

## 2024-06-15 MED ORDER — SENNOSIDES-DOCUSATE SODIUM 8.6-50 MG PO TABS: 2.0000 | ORAL_TABLET | ORAL | 0 refills | Status: AC

## 2024-06-15 MED ORDER — IBUPROFEN 600 MG PO TABS: 600.0000 mg | ORAL_TABLET | Freq: Four times a day (QID) | ORAL | 0 refills | Status: AC

## 2024-06-15 MED ORDER — ACETAMINOPHEN 325 MG PO TABS: 650.0000 mg | ORAL_TABLET | ORAL | Status: AC | PRN

## 2024-06-15 NOTE — Patient Instructions (Signed)
 If you are interested in an outpatient lactation consultation -- available in-office or virtually -- please reach out to us  at:  [facility] (First Floor) ?? [address], [place], [place]  ?? [phone] Please leave a message on our lactation voicemail box. We welcome any lactation-related questions or concerns -- our team is here to support you and your baby.  Lactation Support Groups Join us  at: [facility] ?? Tuesdays, 10:00 AM - 12:00 PM ?? [address], [place] [employer], [place]  Lactating parents and lap babies are welcome!  ?? [email or web address]  ?? [email or web address] -------------  Si est interesado en una consulta ambulatoria de lactancia, disponible en el consultorio o virtualmente, comunquese con nosotros en:  [facility] ?? [address], [place], [place]  ?? [phone] Por favor, deje un mensaje en nuestro buzn de voz de lactancia. Estamos aqu para responder cualquier pregunta o inquietud relacionada con la lactancia y para apoyarle a usted y a su beb.  Grupos de Apoyo para la Lactancia nase a nosotros en: [facility] ?? Martes, de 10:00 a. m. a 12:00 p. m. ?? [address], [place], [place]  Se admiten madres lactantes y bebs en regazo.  ?? [email or web address]  ?? [email or web address]      Hannah Burns, [facility]

## 2024-06-19 ENCOUNTER — Encounter: Admitting: Obstetrics & Gynecology

## 2024-06-20 ENCOUNTER — Inpatient Hospital Stay (HOSPITAL_COMMUNITY)

## 2024-06-21 ENCOUNTER — Telehealth (HOSPITAL_COMMUNITY): Payer: Self-pay | Admitting: *Deleted

## 2024-06-21 NOTE — Telephone Encounter (Signed)
 Attempted hospital discharge follow-up call. Left message for patient to return RN call with any questions or concerns. Allean IVAR Carton, RN, 06/21/24, [phone]

## 2024-06-26 ENCOUNTER — Encounter: Admitting: Family Medicine

## 2024-07-11 ENCOUNTER — Other Ambulatory Visit: Payer: Self-pay

## 2024-07-11 DIAGNOSIS — O24410 Gestational diabetes mellitus in pregnancy, diet controlled: Secondary | ICD-10-CM

## 2024-07-13 ENCOUNTER — Encounter: Payer: Self-pay | Admitting: Student

## 2024-07-13 ENCOUNTER — Other Ambulatory Visit: Payer: Self-pay

## 2024-07-13 ENCOUNTER — Ambulatory Visit: Payer: Self-pay | Admitting: Student

## 2024-07-13 DIAGNOSIS — O24410 Gestational diabetes mellitus in pregnancy, diet controlled: Secondary | ICD-10-CM

## 2024-07-13 DIAGNOSIS — Z124 Encounter for screening for malignant neoplasm of cervix: Secondary | ICD-10-CM

## 2024-07-13 NOTE — Progress Notes (Signed)
 "   Post Partum Visit Note  Hannah Burns is a 34 y.o. [phone] female who presents for a postpartum visit. She is 4 weeks 2 days postpartum following a normal spontaneous vaginal delivery.  I have fully reviewed the prenatal and intrapartum course. The delivery was at 38.1 gestational weeks.  Anesthesia: epidural. Postpartum course has been going well. Baby is doing well. Baby is feeding by bottle - Similac Advance. Bleeding staining only. Bowel function is normal. Bladder function is normal. Patient is not sexually active. Contraception method is Nexplanon. Postpartum depression screening: negative.   The pregnancy intention screening data noted above was reviewed. Potential methods of contraception were discussed. The patient elected to proceed with No data recorded.   Edinburgh Postnatal Depression Scale - 07/13/24 0936       Edinburgh Postnatal Depression Scale:  In the Past 7 Days   I have been able to laugh and see the funny side of things. 0    I have looked forward with enjoyment to things. 0    I have blamed myself unnecessarily when things went wrong. 1    I have been anxious or worried for no good reason. 0    I have felt scared or panicky for no good reason. 1    Things have been getting on top of me. 1    I have been so unhappy that I have had difficulty sleeping. 0    I have felt sad or miserable. 1    I have been so unhappy that I have been crying. 0    The thought of harming myself has occurred to me. 0    Edinburgh Postnatal Depression Scale Total 4          Health Maintenance Due  Topic Date Due   Pneumococcal Vaccine (1 of 2 - PCV) Never done   Hepatitis B Vaccines 19-59 Average Risk (1 of 3 - 19+ 3-dose series) Never done   Cervical Cancer Screening (HPV/Pap Cotest)  10/19/2018   COVID-19 Vaccine (1 - 2025-26 season) Never done    The following portions of the patient's history were reviewed and updated as appropriate: allergies, current medications,  past family history, past medical history, past social history, past surgical history, and problem list.  Review of Systems Pertinent items are noted in HPI.  Objective:  BP 119/69 (BP Location: Left Arm, Patient Position: Sitting, Cuff Size: Normal)   Pulse 77   Wt 206 lb (93.4 kg)   LMP 08/21/2023   Breastfeeding No   BMI 37.68 kg/m    General:  alert, cooperative, and appears stated age   Breasts:  not indicated  Lungs: Normal work of breathing  Heart:  regular rate and rhythm  Abdomen: soft, non-tender; bowel sounds normal; no masses,  no organomegaly   Wound Not present  GU exam:  normal       Assessment:   Normal postpartum exam.   Plan:   Essential components of care per ACOG recommendations:  1.  Mood and well being: Patient with negative depression screening today. Reviewed local resources for support.  - Patient tobacco use? Some days  - hx of drug use? No.    2. Infant care and feeding:  -Patient currently breastmilk feeding? No.  -Social determinants of health (SDOH) reviewed in EPIC. No concerns  3. Sexuality, contraception and birth spacing - Patient does not want a pregnancy in the next year.  Desired family size is 2 children.  - Reviewed reproductive  life planning. Reviewed contraceptive methods based on pt preferences and effectiveness.  Patient desired Hormonal Implant , but prefers to reschedule for placement - Discussed birth spacing of 18 months  4. Sleep and fatigue -Encouraged family/partner/community support of 4 hrs of uninterrupted sleep to help with mood and fatigue  5. Physical Recovery  - Discussed patients delivery and complications. She describes her labor as good. - Patient had a Vaginal, no problems at delivery. Patient had a labial laceration. Perineal healing reviewed. Patient expressed understanding - Patient has urinary incontinence? No. - Patient is not safe to resume physical and sexual activity  6.  Health Maintenance - HM  due items addressed Yes - Last pap smear No results found for: DIAGPAP Pap smear done at today's visit.  -Breast Cancer screening indicated? No.   7. Chronic Disease/Pregnancy Condition follow up: Gestational Diabetes - PP GTT today  - F/U  for nexplanon placement  Nat Dauer, NP [facility] [employer], [facility]  "

## 2024-07-14 ENCOUNTER — Ambulatory Visit: Payer: Self-pay | Admitting: Obstetrics and Gynecology

## 2024-07-14 LAB — CYTOLOGY - PAP
Comment: NEGATIVE
Diagnosis: NEGATIVE
High risk HPV: NEGATIVE

## 2024-07-14 LAB — GLUCOSE TOLERANCE, 2 HOURS
Glucose, 2 hour: 62 mg/dL — ABNORMAL LOW (ref 70–139)
Glucose, GTT - Fasting: 98 mg/dL (ref 70–99)

## 2024-08-10 ENCOUNTER — Ambulatory Visit: Payer: Self-pay | Admitting: Family Medicine
